# Patient Record
Sex: Female | Born: 1945
Health system: Southern US, Community
[De-identification: ages and names within clinical notes are randomized; demographics above are authoritative.]

## PROBLEM LIST (undated history)

## (undated) DIAGNOSIS — C801 Malignant (primary) neoplasm, unspecified: Secondary | ICD-10-CM

## (undated) DIAGNOSIS — I10 Essential (primary) hypertension: Secondary | ICD-10-CM

## (undated) DIAGNOSIS — K219 Gastro-esophageal reflux disease without esophagitis: Secondary | ICD-10-CM

## (undated) DIAGNOSIS — M1990 Unspecified osteoarthritis, unspecified site: Secondary | ICD-10-CM

## (undated) DIAGNOSIS — G959 Disease of spinal cord, unspecified: Secondary | ICD-10-CM

## (undated) DIAGNOSIS — Z923 Personal history of irradiation: Secondary | ICD-10-CM

## (undated) HISTORY — DX: Disease of spinal cord, unspecified: G95.9

## (undated) HISTORY — PX: VAGINAL HYSTERECTOMY: SUR661

## (undated) HISTORY — PX: OTHER SURGICAL HISTORY: SHX169

## (undated) HISTORY — PX: EYE SURGERY: SHX253

## (undated) HISTORY — PX: COLONOSCOPY: SHX5424

---

## 2001-10-15 ENCOUNTER — Emergency Department (HOSPITAL_COMMUNITY): Admission: EM | Admit: 2001-10-15 | Discharge: 2001-10-15 | Payer: Self-pay | Admitting: Emergency Medicine

## 2001-10-15 ENCOUNTER — Encounter: Payer: Self-pay | Admitting: Emergency Medicine

## 2003-10-16 ENCOUNTER — Emergency Department (HOSPITAL_COMMUNITY): Admission: EM | Admit: 2003-10-16 | Discharge: 2003-10-16 | Payer: Self-pay | Admitting: Emergency Medicine

## 2009-06-19 ENCOUNTER — Ambulatory Visit: Payer: Self-pay | Admitting: Internal Medicine

## 2009-06-19 DIAGNOSIS — I10 Essential (primary) hypertension: Secondary | ICD-10-CM | POA: Insufficient documentation

## 2009-06-19 DIAGNOSIS — K219 Gastro-esophageal reflux disease without esophagitis: Secondary | ICD-10-CM

## 2009-06-19 DIAGNOSIS — K5900 Constipation, unspecified: Secondary | ICD-10-CM | POA: Insufficient documentation

## 2009-06-25 ENCOUNTER — Encounter: Payer: Self-pay | Admitting: Internal Medicine

## 2009-06-29 ENCOUNTER — Encounter: Payer: Self-pay | Admitting: Internal Medicine

## 2009-07-03 ENCOUNTER — Ambulatory Visit: Payer: Self-pay | Admitting: Internal Medicine

## 2009-07-03 ENCOUNTER — Ambulatory Visit (HOSPITAL_COMMUNITY): Admission: RE | Admit: 2009-07-03 | Discharge: 2009-07-03 | Payer: Self-pay | Admitting: Internal Medicine

## 2010-06-22 NOTE — Assessment & Plan Note (Signed)
Summary: NPP/ABD PAIN/GU   Visit Type:  Initial Consult Referring Provider:  Sherwood Gambler Primary Care Provider:  [place]  Chief Complaint:  abd pain.  History of Present Illness:  64 year old African American lady sent over out of the courtesy of Dr. Sherwood Gambler for further evaluation of abdominal pain and consideration of screening colonoscopy.  She has some very vague abdominal pain at times which she temporarily relates to constipation; she has no more than 2 bowel movements weekly and oftentimes has to take a laxative wiith which she typiocally has a BM along with resolution of her  abdominal pain. She's not had any melena  or rectal bleeding. No weight loss. No family history of colon polyps or GI neoplasia. She has never had a colonoscopy.  She has had occasional reflux symptoms she describes as heartburn over the years and has been on various acid suppressing agent from time to time; recently on Nexium - currently not taking any acid suppressing agent and not having any reflux symptoms. In addition, she denies odynophagia, dysphagia or early satiety nausea or vomiting. She denies weight loss.     Current Medications (verified): 1)  Enalapril-Hydrochlorothiazide 10-25 Mg Tabs (Enalapril-Hydrochlorothiazide) .... Take 1 Tablet By Mouth Once A Day 2)  Meloxicam 15 Mg Tabs (Meloxicam) .... Take 1 Tablet By Mouth Two Times A Day As Needed 3)  Ranitidine Hcl 150 Mg Caps (Ranitidine Hcl) .... Take 1 Tablet By Mouth Two Times A Day As Needed 4)  Premarin 0.9 Mg Tabs (Estrogens Conjugated) .... Take 1 Tablet By Mouth Once A Day 5)  Stool Softner Otc .... Take 1 Tablet By Mouth Once A Day 6)  Xanax .... As Needed  Allergies (verified): No Known Drug Allergies  Past History:  Family History: Last updated: 06-27-2009 Father: Deceased    Mother:Living age 51    Siblings: 5  ( 4 living )  One brother killed age 21  Social History: Last updated: Jun 27, 2009 Marital Status: Separated Children:  No Occupation: Disabled  Past Medical History: Hypertension Water quality scientist  Past Surgical History: Hysterectomy    Family History: Father: Deceased    Mother:Living age 36    Siblings: 5  ( 4 living )  One brother killed age 48  Social History: Marital Status: Separated Children: No Occupation: Disabled  Vital Signs:  Patient profile:   65 year old female Height:      67 inches Weight:      205.50 pounds BMI:     32.30 Temp:     98.4 degrees F oral Pulse rate:   60 / minute BP sitting:   140 / 90  (left arm) Cuff size:   regular  Vitals Entered By: Cloria Spring LPN (Jun 27, 2009 2:12 PM)  Physical Exam  General:  somewhat disheveled 65 year old lady appeared pleasant no acute distress Eyes:  no scleral icterus. Conjunctiva are pink Lungs:  clear to auscultation Heart:  regular rate and rhythm without murmur gallop rub Abdomen:  obese positive bowel sounds soft nontender without appreciable mass or organomegaly Rectal:  deferred until time of colonoscopy  Impression & Recommendations: Impression: 65 year old lady with occasional vague abdominal pain temporally related to constipation.  Currently, not having any abdominal discomfort. This nice lady has never had a colonoscopy. She would be the average risk screening category at this time.  She has occasional gastric soft reflux disease symptoms but that is not a problem for her at this time.  Recommendations: Screening/diagnostic colonoscopy in the near future. Risks,  benefits, limitations, alternatives have been reviewed;  questions have been answered. All parties were agreeable. We'll make further recommendations as to the management of her constipation once colonoscopy has been performed.  I want thank to thank Dr. Sherwood Gambler for allowing me to see this nice lady today.  Other Orders: Consultation Level IV ([id number])

## 2010-06-22 NOTE — Letter (Signed)
Summary: [facility] ORDER  [facility] ORDER   Imported By: Ricard Dillon 06/25/2009 12:54:43  _____________________________________________________________________  External Attachment:    Type:   Image     Comment:   External Document

## 2010-06-22 NOTE — Letter (Signed)
Summary: referral-[facility]  referral-[facility]   Imported By: Ricard Dillon 06/29/2009 15:47:33  _____________________________________________________________________  External Attachment:    Type:   Image     Comment:   External Document

## 2011-05-28 ENCOUNTER — Ambulatory Visit (HOSPITAL_COMMUNITY)
Admission: AD | Admit: 2011-05-28 | Discharge: 2011-05-28 | Disposition: A | Discharge status: Home / Self Care | Payer: Medicare Other | Source: Ambulatory Visit | Attending: Family Medicine | Admitting: Family Medicine

## 2011-05-28 ENCOUNTER — Other Ambulatory Visit (HOSPITAL_COMMUNITY): Payer: Self-pay | Admitting: Family Medicine

## 2011-05-28 ENCOUNTER — Ambulatory Visit (HOSPITAL_COMMUNITY)
Admission: RE | Admit: 2011-05-28 | Discharge: 2011-05-28 | Disposition: A | Discharge status: Home / Self Care | Payer: Medicare Other | Source: Ambulatory Visit | Attending: Family Medicine | Admitting: Family Medicine

## 2011-05-28 DIAGNOSIS — R52 Pain, unspecified: Secondary | ICD-10-CM

## 2011-05-28 DIAGNOSIS — M25559 Pain in unspecified hip: Secondary | ICD-10-CM | POA: Insufficient documentation

## 2011-06-30 ENCOUNTER — Ambulatory Visit (INDEPENDENT_AMBULATORY_CARE_PROVIDER_SITE_OTHER): Payer: Medicaid Other | Admitting: Orthopedic Surgery

## 2011-06-30 ENCOUNTER — Encounter: Payer: Self-pay | Admitting: Orthopedic Surgery

## 2011-06-30 DIAGNOSIS — M5430 Sciatica, unspecified side: Secondary | ICD-10-CM

## 2011-06-30 DIAGNOSIS — M5136 Other intervertebral disc degeneration, lumbar region: Secondary | ICD-10-CM

## 2011-06-30 DIAGNOSIS — M5431 Sciatica, right side: Secondary | ICD-10-CM | POA: Insufficient documentation

## 2011-06-30 DIAGNOSIS — M5137 Other intervertebral disc degeneration, lumbosacral region: Secondary | ICD-10-CM

## 2011-06-30 MED ORDER — GABAPENTIN 100 MG PO CAPS: 100.0000 mg | ORAL_CAPSULE | Freq: Three times a day (TID) | ORAL | Status: DC

## 2011-06-30 NOTE — Patient Instructions (Signed)
pinched nerve leg from arthritis in your back  Therapy will be needed

## 2011-06-30 NOTE — Progress Notes (Signed)
  Subjective:    Melissa Rosales is a 65 y.o. female who presents with right hip pain. Onset of the symptoms was several months ago. Inciting event: none. The patient reports the hip pain is worse with weight bearing, is aggravated by walking and radiates to RIGHT lower leg lateral side. Aggravating symptoms include: any weight bearing, standing and walking. Patient has had no prior hip problems. Previous visits for this problem: none. Evaluation to date: plain films, which were abnormal  . the hip joint was normal but the lumbar spine show degenerative change.Treatment to date: none.  The following portions of the patient's history were reviewed and updated as appropriate: allergies, current medications, past family history, past medical history, past social history, past surgical history and problem list.   Review of Systems Neurological: positive for numbness and tingling nervousness Behavioral/Psych: negative, nerves   Objective:    BP 140/82  Ht 5\' 7"  (1.702 m)  Wt 202 lb (91.627 kg)  BMI 31.64 kg/m2  Physical Exam(12) GENERAL: normal development , endomorphic body habitus.  Grooming and hygiene are normal  CDV: normal temperature of the lower extremities with no excessive edema Skin: normal back and legs  Lymph: nodes were not palpable/normal  Psychiatric: awake, alert and oriented  Neuro: normal sensation  Lumbar spine nontender in the midline but pain in the RIGHT gluteal area Right hip: full painless range of motion, without tenderness  Left hip: full painless range of motion, without tenderness   Imaging: X-ray hospital hip x-ray reviewed degenerative changes shown lower lumbar spine.  Hip joint normal.  Assessment:    DJD lumbar spine, hip joint normal.   Plan:    Natural history and expected course discussed. Questions answered. PT referral.  Start Neurontin 100 mg 3 times daily

## 2011-07-05 ENCOUNTER — Encounter (HOSPITAL_COMMUNITY): Payer: Self-pay

## 2011-07-05 ENCOUNTER — Ambulatory Visit (HOSPITAL_COMMUNITY)
Admission: RE | Admit: 2011-07-05 | Discharge: 2011-07-05 | Disposition: A | Discharge status: Home / Self Care | Payer: Medicare Other | Source: Ambulatory Visit | Attending: Orthopedic Surgery | Admitting: Orthopedic Surgery

## 2011-07-05 DIAGNOSIS — M545 Low back pain, unspecified: Secondary | ICD-10-CM | POA: Insufficient documentation

## 2011-07-05 DIAGNOSIS — M79609 Pain in unspecified limb: Secondary | ICD-10-CM | POA: Insufficient documentation

## 2011-07-05 DIAGNOSIS — M25569 Pain in unspecified knee: Secondary | ICD-10-CM | POA: Insufficient documentation

## 2011-07-05 DIAGNOSIS — IMO0001 Reserved for inherently not codable concepts without codable children: Secondary | ICD-10-CM | POA: Insufficient documentation

## 2011-07-05 DIAGNOSIS — M6281 Muscle weakness (generalized): Secondary | ICD-10-CM | POA: Insufficient documentation

## 2011-07-05 DIAGNOSIS — I10 Essential (primary) hypertension: Secondary | ICD-10-CM | POA: Insufficient documentation

## 2011-07-05 HISTORY — DX: Essential (primary) hypertension: I10

## 2011-07-05 NOTE — Evaluation (Signed)
Physical Therapy Evaluation- Medicaid  Patient Details  Name: Melissa Rosales MRN: [id number] Date of Birth: 07-19-1945  Today's Date: 07/05/2011 Time: 9147-8295 Time Calculation (min): 45 min Charges: 1 EV, 10 TE Visit#: 1  of 16   Re-eval: 08/04/11 Assessment Diagnosis: low back pain with right leg radiculopathy.   Next MD Visit: none Prior Therapy: none  Past Medical History:  Past Medical History  Diagnosis Date  . Hypertension    Past Surgical History:  Past Surgical History  Procedure Date  . Vaginal hysterectomy   . Vision problems since birth    Subjective Symptoms/Limitations Symptoms: low back pain with right leg radicular symptoms into the ankle and lateral leg.   How long can you sit comfortably?: 5 mins, before it starts tingling.  (sitting feels better than standing) How long can you stand comfortably?: 5-10 How long can you walk comfortably?: immediately feels it walking, relieft comes when she sits down.   Repetition: Increases Symptoms Special Tests: doesn't wake her at night, makes her sit down more frequently than she normally does.   Pain Assessment Currently in Pain?: Yes Pain Score:   4 Pain Location: Leg Pain Orientation: Right Pain Type: Chronic pain Pain Onset: More than a month ago Pain Frequency: Intermittent Pain Relieving Factors: sitting down, pain medicine (gabipentin)  Prior Functioning  Home Living Lives With: Spouse (they both live together but they are separated) Type of Home: House Home Layout: One level Home Access: Stairs to enter Entrance Stairs-Rails: Right Entrance Stairs-Number of Steps: 3 Prior Function Level of Independence: Independent with basic ADLs;Independent with homemaking with ambulation;Independent with gait;Independent with transfers Able to Take Stairs?: Yes Driving: No 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     Assessment RLE Assessment RLE Assessment: Within Functional Limits LLE Assessment LLE Assessment: Within Functional Limits Lumbar AROM Lumbar Flexion: WFL (some pulling on her right side) Lumbar Extension: WFL (pinching on her right side.  ) Lumbar - Right Side Bend: decreased 10 % (pain on right side of low back) Lumbar - Left Side Bend: WFL (pain on right side of low back) Lumbar - Right Rotation: WFL ("a little bit" of pain on the right side of her low back) Lumbar - Left Rotation: WFL ("a little bit" of discomfort on her right side)  Lumbar flexion strength: 3/5 Lumbar extension strength: 3/5  Exercise/Treatments Stretches Active Hamstring Stretch: 2 reps;30 seconds;Limitations Active Hamstring Stretch Limitations: bil Single Knee to Chest Stretch: 2 reps;30 seconds;Limitations Single Knee to Chest Stretch Limitations: bil Lower Trunk Rotation: 5 reps;10 seconds;Limitations Lower Trunk Rotation Limitations: bil Lumbar Exercises: Stability Bridge: 10 reps Bent Knee Raise: 10 reps Ab Set: 10 reps;5 seconds  Physical Therapy Assessment and Plan Clinical Impression Statement: This 66 y.o. female comes to [facility] PT for low back pain and radicular symptoms into her right lateral leg down to her ankle.  She presents wtih increased right leg pain with extension and decreased with flexion.  She has decreased sensation in her right lateral leg with episodes of tingling.  She has normal right leg strength, but decreased trunk flexion and extension strength.  She would benefit from skilled PT to increase core strength, decreased pain at end ROM in her back  and educate her on proper lifting techniques.       Rehab Potential: Good PT Frequency: Min 2X/week PT Duration: 8 weeks PT Treatment/Interventions: Functional mobility training;Therapeutic activities;Therapeutic exercise;Balance training;Neuromuscular re-education;Patient/family education;Other (comment) (  modalities as needed for  pain management, STM/mobs) PT Plan: Continue 2 xs/wk x 8 weeks, review HEP next session ( SK to chest bil, active hamstring bil, bridges, ab set, ab set with marches), add treadmill for warm-up but stay with her I don't know that she has ever been on a treadmill before, add piriformis stretch, SLR with ab set, functional squat with ab set.      Goals Home Exercise Program Pt will Perform Home Exercise Program: Independently PT Short Term Goals Time to Complete Short Term Goals: 4 weeks PT Short Term Goal 1: The patient will report pain of less than or equal to a 2/10 to show improved pain in her right leg, increased QOL and increased tolerance of functional mobility.   PT Short Term Goal 2: The patient will report mild right sided pain with forward flexion in her low back, but no pain with any other ROM testing in low back to show improved pain with mobility and therefore decreased pain during functional acitities.   PT Short Term Goal 3: The patient will increas core strength by 1/2 muscle grade (increase to 3+/5) to show improved strength and spinal stabilization.   PT Long Term Goals Time to Complete Long Term Goals: 8 weeks PT Long Term Goal 1: The patient will report no low back or right leg pain over the past week to show improved QOL and tolerance of lifting activities when she helps her mom.   PT Long Term Goal 2: The patient will increase core muscle strength to at least 4/5 to show improved spinal stability.  Long Term Goal 3: The patient will demonstrate correct lifting technique and lifting posure while lifting a 10 # box 10 times to simulate helping her mother transfer to a WC safely.   Long Term Goal 4: The patient will report no pain at end ROM in all directions of the lumbar spine to show improved pain levels, improved QOL and tolerance of functional activities.    Problem List Patient Active Problem List  Diagnoses  . HYPERTENSION  . GERD  . CONSTIPATION  . Sciatica of right  side  . DDD (degenerative disc disease), lumbar  . Pain in joint, lower leg  . Muscle weakness (generalized)    PT - End of Session Activity Tolerance: Patient tolerated treatment well General Behavior During Session: [facility] for tasks performed Cognition: [facility] for tasks performed PT Plan of Care PT Home Exercise Plan: see scanned report.   Consulted and Agree with Plan of Care: Patient  Rollene Rotunda. Selina Tapper, PT, DPT  07/05/2011, 2:55 PM  Physician Documentation Your signature is required to indicate approval of the treatment plan as stated above.  Please sign and either send electronically or make a copy of this report for your files and return this physician signed original.   Please mark one 1.__approve of plan  2. ___approve of plan with the following conditions.   ______________________________                                                          _____________________ Physician Signature  Date  

## 2011-07-11 ENCOUNTER — Ambulatory Visit (HOSPITAL_COMMUNITY)
Admission: RE | Admit: 2011-07-11 | Discharge: 2011-07-11 | Disposition: A | Discharge status: Home / Self Care | Payer: Medicare Other | Source: Ambulatory Visit | Attending: Orthopedic Surgery | Admitting: Orthopedic Surgery

## 2011-07-11 DIAGNOSIS — M25569 Pain in unspecified knee: Secondary | ICD-10-CM

## 2011-07-11 DIAGNOSIS — M6281 Muscle weakness (generalized): Secondary | ICD-10-CM

## 2011-07-11 NOTE — Progress Notes (Signed)
Physical Therapy Treatment Patient Details  Name: TANNA LOEFFLER MRN: [id number] Date of Birth: 1946/03/11  Today's Date: 07/11/2011 Time: 1350-1430 Time Calculation (min): 40 min Charges: 78' TE Visit#: 2  of 16   Re-eval: 08/04/11    Subjective: Symptoms/Limitations Symptoms: Pt reports that she is doing better from her exercises.  Pain Assessment Currently in Pain?: Yes Pain Score:   3 Pain Location: Back  Exercise/Treatments  Stretches Active Hamstring Stretch: 3 reps;30 seconds Active Hamstring Stretch Limitations: bil Single Knee to Chest Stretch: 3 reps;30 seconds Single Knee to Chest Stretch Limitations: bil Lower Trunk Rotation: Limitations Lower Trunk Rotation Limitations: 10 reps 5 sec Stability Bridge: 10 reps Bent Knee Raise: 10 reps;Limitations Bent Knee Raise Limitations: alternating BIL w/ab set Ab Set: 5 reps;Limitations;5 seconds AB Set Limitations: w/ head lift Leg Raise: Right;Left;Single;10 reps Functional Squats: 10 reps Heel Raises: 10 reps Machine Exercises Tread Mill: 5 min; instructuions for gait and posture    Physical Therapy Assessment and Plan PT Assessment and Plan Clinical Impression Statement: Pt able to corrdinate isometric abdominal exercise with dynamic LE movement.  Notable fatique to abdominal musculature after exercies.  Pt continues to have pain and spasm especially to her R gluteal region which was relieved after moderate manual techniques.   She is able to demonstrate her HEP independently PT Plan: Continue to progress as tolderated.  Add piriformis stretch, prone press up and mid back stretch.      Goals    Problem List Patient Active Problem List  Diagnoses  . HYPERTENSION  . GERD  . CONSTIPATION  . Sciatica of right side  . DDD (degenerative disc disease), lumbar  . Pain in joint, lower leg  . Muscle weakness (generalized)       Cianna Kasparian 07/11/2011, 3:26 PM

## 2011-07-18 ENCOUNTER — Ambulatory Visit (HOSPITAL_COMMUNITY)
Admission: RE | Admit: 2011-07-18 | Discharge: 2011-07-18 | Disposition: A | Discharge status: Home / Self Care | Payer: Medicare Other | Source: Ambulatory Visit | Attending: Physical Therapy | Admitting: Physical Therapy

## 2011-07-18 DIAGNOSIS — M6281 Muscle weakness (generalized): Secondary | ICD-10-CM

## 2011-07-18 DIAGNOSIS — M25569 Pain in unspecified knee: Secondary | ICD-10-CM

## 2011-07-18 NOTE — Progress Notes (Signed)
Physical Therapy Treatment Patient Details  Name: Melissa Rosales MRN: [id number] Date of Birth: Feb 24, 1946  Today's Date: 07/18/2011 Time: 4098-1191 Time Calculation (min): 42 min Charges: 10' TE Visit#: 3  of 16   Re-eval: 08/04/10    Subjective: Symptoms/Limitations Symptoms: Pt reports she stays about 3/10 for 50% of her day.  She states that her exercises are going well at home. She has been approved for Medicare coverage.   Exercise/Treatments Stretches Prone Mid Back Stretch: 3 reps;30 seconds Piriformis Stretch: 3 reps;30 seconds Lumbar Exercises Scapular Retraction: 10 reps;Theraband Theraband Level (Scapular Retraction): Level 3 (Green) Row: 10 reps;Theraband Theraband Level (Row): Level 3 (Green) Shoulder Extension: 10 reps;Theraband Theraband Level (Shoulder Extension): Level 3 (Green) Stability Clam: Side-lying;20 reps Bridge: 10 reps;5 seconds;Limitations Bridge Limitations: w/iso hip adduction Bent Knee Raise: 20 reps Bent Knee Raise Limitations: alternating BIL w/ab set Ab Set: Supine;10 reps;5 seconds AB Set Limitations: w/ head lift Straight Leg Raise: Supine;10 reps;5 seconds;Limitations Straight Leg Raises Limitations: BLE Opposite Arm/Leg Raise: Right arm/Left leg;Left arm/Right leg;20 reps Functional Squats: 15 reps Heel Raises: 15 reps;Limitations Heel Raises Limitations: Toe Raises: 15x Machine Exercises Tread Mill: 5' 1.3 mph. Less cueing for posture and ab set     Physical Therapy Assessment and Plan PT Assessment and Plan Clinical Impression Statement: Pt demonstrates significant weakness in her multifidus and decreased strength thorughout hip extensors.  Able to demo her HEP independently.  Updated HEP today. Added new exercises from the plan as well as dynamic UE exercises with core stability PT Plan: Cont to progress core stability and decrease pain to meet functional goals. Add heel squeezes    Goals    Problem List Patient Active  Problem List  Diagnoses  . HYPERTENSION  . GERD  . CONSTIPATION  . Sciatica of right side  . DDD (degenerative disc disease), lumbar  . Pain in joint, lower leg  . Muscle weakness (generalized)    PT Plan of Care PT Home Exercise Plan: updated today.  See scanned report   Deserai Cansler 07/18/2011, 2:03 PM

## 2011-07-25 ENCOUNTER — Ambulatory Visit (HOSPITAL_COMMUNITY)
Admission: RE | Admit: 2011-07-25 | Discharge: 2011-07-25 | Disposition: A | Discharge status: Home / Self Care | Payer: Medicare Other | Source: Ambulatory Visit | Attending: Family Medicine | Admitting: Family Medicine

## 2011-07-25 DIAGNOSIS — IMO0001 Reserved for inherently not codable concepts without codable children: Secondary | ICD-10-CM | POA: Insufficient documentation

## 2011-07-25 DIAGNOSIS — M25569 Pain in unspecified knee: Secondary | ICD-10-CM

## 2011-07-25 DIAGNOSIS — I10 Essential (primary) hypertension: Secondary | ICD-10-CM | POA: Insufficient documentation

## 2011-07-25 DIAGNOSIS — M545 Low back pain, unspecified: Secondary | ICD-10-CM | POA: Insufficient documentation

## 2011-07-25 DIAGNOSIS — M6281 Muscle weakness (generalized): Secondary | ICD-10-CM

## 2011-07-25 DIAGNOSIS — M79609 Pain in unspecified limb: Secondary | ICD-10-CM | POA: Insufficient documentation

## 2011-07-25 NOTE — Progress Notes (Signed)
Physical Therapy Treatment Patient Details  Name: Melissa Rosales MRN: [id number] Date of Birth: 1945/09/13  Today's Date: 07/25/2011 Time: 4098-1191 Time Calculation (min): 46 min 46' TE Visit#: 4  of 16   Re-eval: 08/04/11    Subjective: Symptoms/Limitations Symptoms: Reports independence with HEP and her pain is less.  States she is able to stand comfortably for 10 minutes and walk comfortably for 5 minutes with an increase in pain.   Reports less pain with her daily activities. Pain Assessment Currently in Pain?: Yes Pain Score:   2  Precautions/Restrictions     Exercise/Treatments Stretches Lower Trunk Rotation: 5 reps Lower Trunk Rotation Limitations: 5 sec Press Ups: 60 seconds Prone Mid Back Stretch: 3 reps;30 seconds Piriformis Stretch: 3 reps;30 seconds;Limitations Piriformis Stretch Limitations: BLE Lumbar Exercises Scapular Retraction: 10 reps Theraband Level (Scapular Retraction): Level 3 (Green) Row: 15 reps Theraband Level (Row): Level 3 (Green) Shoulder Extension: 15 reps Theraband Level (Shoulder Extension): Level 3 (Green) Shoulder ADduction: Both;10 reps;Theraband Theraband Level (Shoulder Adduction): Level 3 (Green) Stability Bridge: 10 reps Bridge Limitations: w/knee coming forward to encourage pelvic motion (decreased motion to r pelvis) Heel Squeeze: 10 reps;5 seconds Opposite Arm/Leg Raise: Right arm/Left leg;Left arm/Right leg;10 reps Functional Squats: 20 reps Heel Raises: 20 reps Heel Raises Limitations: Toe Raises: 20x Machine Exercises Tread Mill: 8' w/instruction for posture  Stretches Piriformis Stretch: 3 reps;30 seconds;Limitations Piriformis Stretch Limitations: BLE Aerobic Tread Mill: 8' w/instruction for posture  Supine Hip Adduction Isometric: 10 reps;Limitations Hip Adduction Isometric Limitations: 10 sec hold  Physical Therapy Assessment and Plan PT Assessment and Plan Clinical Impression Statement: Pt has improved TA  and multifidus activation and appropriate hip extensor strength.  Demonstrates decreased R pelvic rotation.  Pt had radiating symptoms before TM today, performed 20 back extensions and reported moderate resolving in symptoms, and full resolved symtoms after 5 mintues on TM.  PT Plan: Cont to progress core stability and decrease pain to meet functional goals.    Problem List Patient Active Problem List  Diagnoses  . HYPERTENSION  . GERD  . CONSTIPATION  . Sciatica of right side  . DDD (degenerative disc disease), lumbar  . Pain in joint, lower leg  . Muscle weakness (generalized)   Gehrig Patras 07/25/2011, 1:51 PM

## 2011-08-01 ENCOUNTER — Ambulatory Visit (HOSPITAL_COMMUNITY)
Admission: RE | Admit: 2011-08-01 | Discharge: 2011-08-01 | Disposition: A | Discharge status: Home / Self Care | Payer: Medicare Other | Source: Ambulatory Visit | Attending: Family Medicine | Admitting: Family Medicine

## 2011-08-01 DIAGNOSIS — M25569 Pain in unspecified knee: Secondary | ICD-10-CM

## 2011-08-01 DIAGNOSIS — M6281 Muscle weakness (generalized): Secondary | ICD-10-CM

## 2011-08-01 NOTE — Progress Notes (Signed)
Physical Therapy Treatment Patient Details  Name: ADDLEY BALLINGER MRN: [id number] Date of Birth: 1945-10-10  Today's Date: 08/01/2011 Time: 4098-1191 Time Calculation (min): 48 min Charges: 35' Manual, 12' NMR Visit#: 5  of 16   Re-eval: 08/04/11    Subjective: Symptoms/Limitations Symptoms: I've been doing really well at home.  My pain overall is about a 2/10 Pain Assessment Currently in Pain?: Yes Pain Score:   2 Pain Location: Back  Precautions/Restrictions     Exercise/Treatments Mobility/Balance        Multifidus exercise: Prone with Tactile and Verbal cueing.  Pt responds best with VC's for pushing belly button into mat.  Able to maintain 7x for 10 sec once NMR was established.  Manual Therapy Manual Therapy: Joint mobilization Joint Mobilization: Grade III-IV PA joint mobs to R Transverse process, AP and PA to R proximal tibfib joint.  Other Manual Therapy: Nerve glides to improve sciatic and femoral nerve lenth to decrease buring into R LE.   Physical Therapy Assessment and Plan PT Assessment and Plan Clinical Impression Statement: Comparble sign forward flexion and R quadrant extension 2/10 to R hip.  Balance: R SLS: 3 sec; L SLS: 2 sec. decreased coordination with bilateral SL squat.  Burning pain to R calf only.  0/10 pain with forward flexion , same 2/10 pain with L quadrant extension.  Standing x3 minutes initally brought on burning symptoms and R knee flexion 117 after manual therapy knee flexion to 125, mainitained 3 minutes and leg burned.  Required multimodal cueing for approrpiate multifidus activation.    PT Plan: Re-assess quadrant pain and burning into legs.  If able, progress 10x10 sec holds for multifidus and add PFC.     Goals    Problem List Patient Active Problem List  Diagnoses  . HYPERTENSION  . GERD  . CONSTIPATION  . Sciatica of right side  . DDD (degenerative disc disease), lumbar  . Pain in joint, lower leg  . Muscle weakness  (generalized)    PT Plan of Care PT Home Exercise Plan: see scanned report.  (Multifidus 7x 10 sec holds.) Consulted and Agree with Plan of Care: Patient  GP No functional reporting required  Tessy Pawelski 08/01/2011, 4:23 PM

## 2011-08-08 ENCOUNTER — Ambulatory Visit (HOSPITAL_COMMUNITY)
Admission: RE | Admit: 2011-08-08 | Discharge: 2011-08-08 | Disposition: A | Discharge status: Home / Self Care | Payer: Medicare Other | Source: Ambulatory Visit | Attending: Family Medicine | Admitting: Family Medicine

## 2011-08-08 DIAGNOSIS — M25569 Pain in unspecified knee: Secondary | ICD-10-CM

## 2011-08-08 DIAGNOSIS — M6281 Muscle weakness (generalized): Secondary | ICD-10-CM

## 2011-08-08 NOTE — Progress Notes (Signed)
Physical Therapy Treatment Patient Details  Name: Melissa Rosales MRN: [id number] Date of Birth: [birth year]  Today's Date: 08/08/2011 Time: 4098-1191 Time Calculation (min): 42 min Visit#: 6  of 16   Re-eval: 09/05/11  Charge: therex:34 min MMT 1 unit ROM Measurement 1 unit  Subjective: Symptoms/Limitations Symptoms: Pain in R LE scale 2/10 today. Pain Assessment Currently in Pain?: Yes Pain Score:   2 Pain Location: Back Pain Orientation: Right  Objective:   Exercise/Treatments Stretches Passive Hamstring Stretch: 3 reps;30 seconds Lower Trunk Rotation: 5 reps;10 seconds Piriformis Stretch: 3 reps;30 seconds;Limitations Piriformis Stretch Limitations: BLE Stability Bridge: 10 reps Bridge Limitations: w/knee coming forward to encourage pelvic motion (decreased motion to r pelvis) Bent Knee Raise: 20 reps Bent Knee Raise Limitations: alternating BIL w/ab set  Physical Therapy Assessment and Plan PT Assessment and Plan Clinical Impression Statement: Reassessment complete, Melissa Rosales has had 6 OPPT sessions overs 4 weeks with 2/3 STG and 1/4 LTGs met.  Pt is independent with HEP.  Lumbar strength has improved to 4/5 both flexion and extension.  Pt's lumbar ROM all WFL with no c/o pain with movement.  Pt stated pain scale between 1-5/10 R LE.  Pt with max cueing required for body mechanics for lifting.   PT Plan: Recommend continuing PT 2x a week for 4 more weeks to address remaining impairements.    Goals Home Exercise Program Pt will Perform Home Exercise Program: Independently PT Goal: Perform Home Exercise Program - Progress: Met PT Short Term Goals Time to Complete Short Term Goals: 4 weeks PT Short Term Goal 1: The patient will report pain of less than or equal to a 2/10 to show improved pain in her right leg, increased QOL and increased tolerance of functional mobility.   PT Short Term Goal 1 - Progress: Not met (pain range 1-5/10) PT Short Term Goal 2: The patient  will report mild right sided pain with forward flexion in her low back, but no pain with any other ROM testing in low back to show improved pain with mobility and therefore decreased pain during functional acitities.   PT Short Term Goal 2 - Progress: Met (no pain with any motion test) PT Short Term Goal 3: The patient will increas core strength by 1/2 muscle grade (increase to 3+/5) to show improved strength and spinal stabilization.   PT Short Term Goal 3 - Progress: Met PT Long Term Goals Time to Complete Long Term Goals: 8 weeks PT Long Term Goal 1: The patient will report no low back or right leg pain over the past week to show improved QOL and tolerance of lifting activities when she helps her mom.   PT Long Term Goal 1 - Progress: Not met PT Long Term Goal 2: The patient will increase core muscle strength to at least 4/5 to show improved spinal stability.  PT Long Term Goal 2 - Progress: Met Long Term Goal 3: The patient will demonstrate correct lifting technique and lifting posure while lifting a 10 # box 10 times to simulate helping her mother transfer to a WC safely.   Long Term Goal 3 Progress: Not met Long Term Goal 4: The patient will report no pain at end ROM in all directions of the lumbar spine to show improved pain levels, improved QOL and tolerance of functional activities.   Long Term Goal 4 Progress: Not met  Problem List Patient Active Problem List  Diagnoses  . HYPERTENSION  . GERD  . CONSTIPATION  .  Sciatica of right side  . DDD (degenerative disc disease), lumbar  . Pain in joint, lower leg  . Muscle weakness (generalized)    PT - End of Session Activity Tolerance: Patient tolerated treatment well General Behavior During Session: [facility] for tasks performed Cognition: [facility] for tasks performed  GP No functional reporting required  Juel Burrow, PTA 08/08/2011, 5:32 PM

## 2011-08-12 ENCOUNTER — Ambulatory Visit (HOSPITAL_COMMUNITY)
Admission: RE | Admit: 2011-08-12 | Discharge: 2011-08-12 | Disposition: A | Discharge status: Home / Self Care | Payer: Medicare Other | Source: Ambulatory Visit

## 2011-08-12 DIAGNOSIS — M25569 Pain in unspecified knee: Secondary | ICD-10-CM

## 2011-08-12 DIAGNOSIS — M6281 Muscle weakness (generalized): Secondary | ICD-10-CM

## 2011-08-12 NOTE — Progress Notes (Signed)
Physical Therapy Treatment Patient Details  Name: Melissa Rosales MRN: [id number] Date of Birth: [birth year]  Today's Date: 08/12/2011 Time: 8469-6295 Time Calculation (min): 45 min Visit#: 7  of 16   Re-eval: 09/05/11  Charge: manual 10 min therex 23 min NMR 12 min  Subjective: Symptoms/Limitations Symptoms: Pt stated radiating R LE pain 2/10 today.   Pain Assessment Currently in Pain?: Yes Pain Score:   2 Pain Location: Leg Pain Orientation: Right  Objective:   Exercise/Treatments Stretches Piriformis Stretch: 3 reps;30 seconds;Limitations Piriformis Stretch Limitations: BLE Lumbar Exercises Scapular Retraction: 15 reps;Standing;Theraband Theraband Level (Scapular Retraction): Level 3 (Green) Row: 15 reps;Standing;Theraband Theraband Level (Row): Level 3 (Green) Shoulder Extension: 15 reps;Standing;Theraband Theraband Level (Shoulder Extension): Level 3 (Green) Stability Clam: Side-lying;5 reps;Limitations Clam Limitations: NMR for glut med with tactile cueing to deactivate quad and h/s 5x 10" holds with noticable fatigue Bridge: 10 reps Bridge Limitations: w/knee coming forward to encourage pelvic motion (decreased motion to r pelvis) Heel Squeeze: 10 reps;5 seconds Opposite Arm/Leg Raise: Right arm/Left leg;Left arm/Right leg;10 reps Functional Squats: Limitations Functional Squats Limitations: resume next session Machine Exercises Tread Mill: resume next time  Manual Therapy Manual Therapy: Other (comment) Other Manual Therapy: Nerve glides to improve sciatic and femoral nerve lenth to decrease buring into R LE.x 10 min  Physical Therapy Assessment and Plan PT Assessment and Plan Clinical Impression Statement: Nerve gliding complete with positive results, pt with increased lumbar ROM and stated decreased tingling in R foot following manual.  NMR complete for proper musculature complete for glut met and transverse transverse abdominis.  Pt required  tactile/verbal cueing to deactive quad and h/s to reduce compensation secondary to weakness.  Pt explained musculature purpose/action for stability with TrA.  Pt unable to hold TrA contraction supine position and R sidelying.  Able to hold contraction max of 5" 2 reps. PT Plan: Begin heel/toe in and out for hip mobility to improve gait mechanics and resume functional squats with appropraite body mechanics next session.      Goals    Problem List Patient Active Problem List  Diagnoses  . HYPERTENSION  . GERD  . CONSTIPATION  . Sciatica of right side  . DDD (degenerative disc disease), lumbar  . Pain in joint, lower leg  . Muscle weakness (generalized)    PT - End of Session Activity Tolerance: Patient tolerated treatment well General Behavior During Session: [facility] for tasks performed Cognition: [facility] for tasks performed  GP No functional reporting required  Juel Burrow 08/12/2011, 2:46 PM

## 2011-08-15 ENCOUNTER — Ambulatory Visit (HOSPITAL_COMMUNITY): Payer: Medicare Other | Admitting: *Deleted

## 2011-08-15 ENCOUNTER — Ambulatory Visit (HOSPITAL_COMMUNITY): Payer: Medicare Other | Admitting: Physical Therapy

## 2011-08-17 ENCOUNTER — Ambulatory Visit (HOSPITAL_COMMUNITY): Payer: Medicare Other | Admitting: Physical Therapy

## 2011-08-19 ENCOUNTER — Ambulatory Visit (HOSPITAL_COMMUNITY): Payer: Medicare Other | Admitting: Physical Therapy

## 2011-08-23 ENCOUNTER — Ambulatory Visit (HOSPITAL_COMMUNITY)
Admission: RE | Admit: 2011-08-23 | Discharge: 2011-08-23 | Disposition: A | Discharge status: Home / Self Care | Payer: Medicare Other | Source: Ambulatory Visit | Attending: Orthopedic Surgery | Admitting: Orthopedic Surgery

## 2011-08-23 DIAGNOSIS — M545 Low back pain, unspecified: Secondary | ICD-10-CM | POA: Insufficient documentation

## 2011-08-23 DIAGNOSIS — M6281 Muscle weakness (generalized): Secondary | ICD-10-CM

## 2011-08-23 DIAGNOSIS — M79609 Pain in unspecified limb: Secondary | ICD-10-CM | POA: Insufficient documentation

## 2011-08-23 DIAGNOSIS — IMO0001 Reserved for inherently not codable concepts without codable children: Secondary | ICD-10-CM | POA: Insufficient documentation

## 2011-08-23 DIAGNOSIS — M25569 Pain in unspecified knee: Secondary | ICD-10-CM

## 2011-08-23 DIAGNOSIS — I10 Essential (primary) hypertension: Secondary | ICD-10-CM | POA: Insufficient documentation

## 2011-08-23 NOTE — Progress Notes (Signed)
Physical Therapy Treatment Patient Details  Name: Melissa Rosales MRN: [id number] Date of Birth: [birth year]  Today's Date: 08/23/2011 Time: 7829-5621 Time Calculation (min): 40 min Visit#: 8  of 16   Re-eval: 09/05/11  Charge:  therex 16 min Gait 6 min NMR 10 min Manual 8 min  Subjective: Symptoms/Limitations Symptoms: Radiating R LE pain 2/10 today, no pain in back.  The nerve gliding helped a little bit last treatment.  Pain Assessment Currently in Pain?: Yes Pain Score:   2 Pain Location: Leg Pain Orientation: Right  Objective:   Exercise/Treatments Stability Clam: 10 reps;Limitations;Side-lying Clam Limitations: sitting toe/heel in/out 10" holds with diaphragmatic breathing; NMR for glut med with tactile cueing to deactive quad/h/s Bridge: 10 reps Functional Squats: Standing;20 reps;Limitations Functional Squats Limitations: with cueing for proper form Machine Exercises Tread Mill: 6' @ 1.5 w/ cueing to increase stride length and posture  Manual Therapy Manual Therapy: Other (comment) Other Manual Therapy: Nerve glides to improve sciatic and femoral nerve lenth to decrease buring into R LE  Physical Therapy Assessment and Plan PT Assessment and Plan Clinical Impression Statement: Began seated heel/toe IR/ER exercise for hip mobility with limited L hip IR noted, improved gait mechanics following new exercises.  PFC education for TrA contraction for back stability/strengthening with noted rapid superficial, unilateral (R>L) contracion requiring cueing to maintain breathing unable to hold greater than 4" max due to weakness.   PT Plan: Continue with current POC progress strength and stability.      Goals    Problem List Patient Active Problem List  Diagnoses  . HYPERTENSION  . GERD  . CONSTIPATION  . Sciatica of right side  . DDD (degenerative disc disease), lumbar  . Pain in joint, lower leg  . Muscle weakness (generalized)    PT - End of Session Activity  Tolerance: Patient tolerated treatment well General Behavior During Session: [facility] for tasks performed Cognition: [facility] for tasks performed  GP No functional reporting required  Juel Burrow, PTA 08/23/2011, 3:16 PM

## 2011-08-26 ENCOUNTER — Ambulatory Visit (HOSPITAL_COMMUNITY)
Admission: RE | Admit: 2011-08-26 | Discharge: 2011-08-26 | Disposition: A | Discharge status: Home / Self Care | Payer: Medicare Other | Source: Ambulatory Visit | Attending: Family Medicine | Admitting: Family Medicine

## 2011-08-26 DIAGNOSIS — M6281 Muscle weakness (generalized): Secondary | ICD-10-CM

## 2011-08-26 DIAGNOSIS — M25569 Pain in unspecified knee: Secondary | ICD-10-CM

## 2011-08-26 NOTE — Progress Notes (Signed)
Physical Therapy Treatment Patient Details  Name: Melissa Rosales MRN: [id number] Date of Birth: 1945-12-29  Today's Date: 08/26/2011 Time: 1478-2956 Time Calculation (min): 44 min Visit#: 9  of 16   Re-eval: 09/05/11  Charge: therex: 28 min Gait 8 min Manual 8 min  Subjective: Symptoms/Limitations Symptoms: No pain in back today, pt stated only pain R distal knee 2/10.  Pt stated the nerve gliding helped reduce the tingling down last treatment, Pain Assessment Currently in Pain?: Yes Pain Score:   2 Pain Location: Leg Pain Orientation: Right  Objective:  Exercise/Treatments Stability Clam: 10 reps;Limitations;Side-lying Clam Limitations: sitting toe/heel in/out 10" holds with diaphragmatic breathing; NMR for glut med with tactile cueing to deactive quad/h/s Bridge: 10 reps;Limitations Bridge Limitations: with adduction ball Heel Squeeze: 10 reps;5 seconds Opposite Arm/Leg Raise: Right arm/Left leg;Left arm/Right leg;10 reps Functional Squats: 20 reps;Limitations;5 seconds Functional Squats Limitations: good form noted following demonstration Machine Exercises Tread Mill: 8' @ 1.5 w/ cueing to increase stride length and posture  Manual Therapy Manual Therapy: Other (comment) Other Manual Therapy: Nerve glides to improve sciatic and femoral nerve lenth to decrease buring into R LE  Physical Therapy Assessment and Plan PT Assessment and Plan Clinical Impression Statement: Good gait mechanics noted on treadmill at beginning of session with min cueing initially for posture, to slow down and increase stride length.  Pt improving symmetry with PFC control. PT Plan: Continue progressing current POC    Goals    Problem List Patient Active Problem List  Diagnoses  . HYPERTENSION  . GERD  . CONSTIPATION  . Sciatica of right side  . DDD (degenerative disc disease), lumbar  . Pain in joint, lower leg  . Muscle weakness (generalized)    PT - End of Session Activity  Tolerance: Patient tolerated treatment well General Behavior During Session: [facility] for tasks performed Cognition: [facility] for tasks performed  GP No functional reporting required  Juel Burrow, PTA 08/26/2011, 3:51 PM

## 2011-08-29 ENCOUNTER — Ambulatory Visit (HOSPITAL_COMMUNITY)
Admission: RE | Admit: 2011-08-29 | Discharge: 2011-08-29 | Disposition: A | Discharge status: Home / Self Care | Payer: Medicare Other | Source: Ambulatory Visit | Attending: Orthopedic Surgery | Admitting: Orthopedic Surgery

## 2011-08-29 NOTE — Progress Notes (Signed)
Physical Therapy Treatment Patient Details  Name: Melissa Rosales MRN: [id number] Date of Birth: 1945/08/11  Today's Date: 08/29/2011 Time: 5621-3086 Time Calculation (min): 40 min Charges: 10' NMR, 30' TE Visit#: 10  of 16   Re-eval:      Subjective: Symptoms/Limitations Symptoms: Pt reports that her pain in her R leg happens typically at the middle of the and during the night time and sometimes it wakes her up at night (nagging feeling) Pain Assessment Currently in Pain?: Yes Pain Score:   2 Pain Location: Leg Pain Orientation: Right  Exercise/Treatments Standing Other Standing Knee Exercises: Clams, standing on RLE attempted 5 x Seated Other Seated Knee Exercises: Heel/Toe Roll in and outs 7x10 sec holds Supine Bridges: 10 reps;Limitations Bridges Limitations: w/marching 10 reps each side; w/clams grn t-band x10 Sidelying Clams: NMR to establish correct position.  AAROM to RLE 3x10 sec holds, AROM LLE 4x10 sec Prone  Other Prone Exercises: Multifidus exercises after NMR established 10x10 sec   Manual Therapy Other Manual Therapy: Prone: Nerve glides to improve sciatic and femoral nerve lenth to decrease buring into R LE (start 90 degrees knee flexion with pain, after: 120 degrees0  Physical Therapy Assessment and Plan PT Assessment and Plan Clinical Impression Statement: Reports she has improved pain to her lower leg.  She continues to have increased pain to her hip, which is likely due to her decreased gluteus medius strength.   required max NMR to activate R gluteus medius and hold for 10 sec.  PT Plan: Cont to progress    Goals    Problem List Patient Active Problem List  Diagnoses  . HYPERTENSION  . GERD  . CONSTIPATION  . Sciatica of right side  . DDD (degenerative disc disease), lumbar  . Pain in joint, lower leg  . Muscle weakness (generalized)   GP No functional reporting required  Lashaye Fisk 08/29/2011, 1:52 PM

## 2011-09-02 ENCOUNTER — Ambulatory Visit (HOSPITAL_COMMUNITY)
Admission: RE | Admit: 2011-09-02 | Discharge: 2011-09-02 | Disposition: A | Discharge status: Home / Self Care | Payer: Medicare Other | Source: Ambulatory Visit | Attending: Family Medicine | Admitting: Family Medicine

## 2011-09-02 DIAGNOSIS — M25569 Pain in unspecified knee: Secondary | ICD-10-CM

## 2011-09-02 DIAGNOSIS — M6281 Muscle weakness (generalized): Secondary | ICD-10-CM

## 2011-09-02 NOTE — Evaluation (Addendum)
Physical Therapy Discharge and Treatment  Patient Details  Name: Melissa Rosales MRN: [id number] Date of Birth: 12-22-1945  Today's Date: 09/02/2011 Time: 4098-1191 Time Calculation (min): 43 min Charges: 1 ROM, 1 MMT, 40' TE Visit#: 11  of 16   Re-eval: 09/02/11    Past Medical History:  Past Medical History  Diagnosis Date  . Hypertension    Past Surgical History:  Past Surgical History  Procedure Date  . Vaginal hysterectomy   . Vision problems since birth     Subjective Symptoms/Limitations Symptoms: I feel like I am 90% better.  My pain is just in my R hip and Lower Leg sometimes.   How long can you sit comfortably?: No difficulty w/o radicular symptoms How long can you stand comfortably?: 15 minutes How long can you walk comfortably?: 30 minutes continuously.  Pain Assessment Currently in Pain?: No/denies  Assessment RLE Strength Right Hip Flexion: 4/5; Extension: 4/5; ABduction: 4/5; ADduction: 4/5 Right Knee Flexion: 4/5; Extension: 4/5 LLE Strength Left Hip Flexion: 4/5; Extension: 4/5; ABduction: 4/5; ADduction: 4/5 Left Knee Flexion: 4/5; Extension: 4/5 Lumbar AROM Overall Lumbar AROM Comments: without complaints of pain Lumbar Flexion: WFL;  Extension: WFL; Right Side Bend: WFL;  Left Side Bend: WFL;  Right Rotation: WFL;  Left Rotation: [facility]  Lumbar Strength Lumbar Flexion: 5/5; Extension: 5/5 Palpation: mild increased pain to R hip and lower leg  Exercise/Treatments Stretches Active Hamstring Stretch: Limitations Active Hamstring Stretch Limitations: Nerve glides x15 BLE Aerobic Tread Mill: 10 min 1.5 mph Standing Heel Raises: 20 reps;Limitations Heel Raises Limitations: Toe Raises 20 reps Wall Slides: 10 reps;3 seconds Other Standing Lumbar Exercises: Floor to counter box lifts 10# x10 w/cueing for proper techniques Supine Clam: 5 reps;Other (comment) (15 sec holds) Clam Limitations: BLE Prone  Opposite Arm/Leg Raise: Right arm/Left leg;Left  arm/Right leg;10 reps Other Prone Lumbar Exercises: Multifidus 5x10 sec holds  Education on importance of HEP.  Reviewed advanced HEP  Physical Therapy Assessment and Plan PT Assessment and Plan Clinical Impression Statement: Melissa Rosales has attended 11 OP PT visits and has met all of her STG and 4/5 of her LTG.  She has dramatically improved with her overall LE strength, motor control, muscular endurance, core stability  and independence with her HEP which has decreased her overall back and LE pain. She continues to complain of R hip pain and R LE pain (1/10) occasionally.  She reports that overall her walking has become better and she no longer has to manually transfer her mother.  She will continue to benefit from a HEP to continue with maintence.  PT Plan: D/C w/HEP    Goals Home Exercise Program Pt will Perform Home Exercise Program: Independently PT Goal: Perform Home Exercise Program - Progress: Met PT Short Term Goals Time to Complete Short Term Goals: 4 weeks PT Short Term Goal 1: The patient will report pain of less than or equal to a 2/10 to show improved pain in her right leg, increased QOL and increased tolerance of functional mobility.   PT Short Term Goal 1 - Progress: Met PT Short Term Goal 2: The patient will report mild right sided pain with forward flexion in her low back, but no pain with any other ROM testing in low back to show improved pain with mobility and therefore decreased pain during functional acitities.   PT Short Term Goal 2 - Progress: Met PT Short Term Goal 3: The patient will increas core strength by 1/2 muscle grade (increase to  3+/5) to show improved strength and spinal stabilization.   PT Short Term Goal 3 - Progress: Met PT Long Term Goals Time to Complete Long Term Goals: 8 weeks PT Long Term Goal 1: The patient will report no low back or right leg pain over the past week to show improved QOL and tolerance of lifting activities when she helps her mom.     PT Long Term Goal 1 - Progress: Partly met PT Long Term Goal 2: The patient will increase core muscle strength to at least 4/5 to show improved spinal stability.  PT Long Term Goal 2 - Progress: Met Long Term Goal 3: The patient will demonstrate correct lifting technique and lifting posure while lifting a 10 # box 10 times to simulate helping her mother transfer to a WC safely.   Long Term Goal 3 Progress: Met Long Term Goal 4: The patient will report no pain at end ROM in all directions of the lumbar spine to show improved pain levels, improved QOL and tolerance of functional activities.   Long Term Goal 4 Progress: Met  Problem List Patient Active Problem List  Diagnoses  . HYPERTENSION  . GERD  . CONSTIPATION  . Sciatica of right side  . DDD (degenerative disc disease), lumbar  . Pain in joint, lower leg  . Muscle weakness (generalized)   Melissa Rosales 09/02/2011, 1:40 PM  Physician Documentation Your signature is required to indicate approval of the treatment plan as stated above.  Please sign and either send electronically or make a copy of this report for your files and return this physician signed original.   Please mark one 1.__approve of plan  2. ___approve of plan with the following conditions.   ______________________________                                                          _____________________ Physician Signature                                                                                                             Date

## 2011-09-02 NOTE — Evaluation (Signed)
Physical Therapy Evaluation  Patient Details  Name: Melissa Rosales MRN: [id number] Date of Birth: Nov 07, 1945  Today's Date: 09/02/2011 Time: 1301-     Visit#: 11  of 16   Re-eval: 09/02/11    Past Medical History:  Past Medical History  Diagnosis Date  . Hypertension    Past Surgical History:  Past Surgical History  Procedure Date  . Vaginal hysterectomy   . Vision problems since birth     Subjective    Precautions/Restrictions     Prior Functioning     Cognition/Observation    Sensation/Coordination/Flexibility/Functional Tests    Assessment RLE Strength Right Hip Flexion: 4/5 Right Hip Extension: 4/5 Right Hip ABduction: 4/5 Right Hip ADduction: 4/5 Right Knee Flexion: 4/5 Right Knee Extension: 4/5 LLE Strength Left Hip Flexion: 4/5 Left Hip Extension: 4/5 Left Hip ABduction: 4/5 Left Hip ADduction: 4/5 Left Knee Flexion: 4/5 Left Knee Extension: 4/5 Lumbar AROM Overall Lumbar AROM Comments: without complaints of pain Lumbar Flexion: WFL Lumbar Extension: WFL Lumbar - Right Side Bend: WFL Lumbar - Left Side Bend: WFL Lumbar - Right Rotation: WFL Lumbar - Left Rotation: [facility]  Lumbar Strength Lumbar Flexion: 5/5 Lumbar Extension: 5/5 Palpation Palpation: mild increased pain to R hip and lower leg  Exercise/Treatments Stretches Active Hamstring Stretch: Limitations Active Hamstring Stretch Limitations: Nerve glides x15 BLE Aerobic   Machines for Strengthening   Standing Other Standing Lumbar Exercises: Floor to counter box lifts 10# x10 w/cueing for proper techniques Seated   Supine Clam: 5 reps;Other (comment) (15 sec holds) Clam Limitations: BLE Sidelying   Prone  Opposite Arm/Leg Raise: Right arm/Left leg;Left arm/Right leg;10 reps Other Prone Lumbar Exercises: Multifidus 5x10 sec holds Quadruped       Physical Therapy Assessment and Plan PT Assessment and Plan Clinical Impression Statement: Melissa Rosales has attended 11  OP PT visits and has met all of her STG and 4/5 of her LTG.  She has dramatically improved with her overall LE strength, motor control, muscular endurance, core stability  and independence with her HEP which has decreased her overall back and LE pain. She continues to complain of R hip pain and R LE pain (1/10) occasionally.  She reports that overall her walking has become better and she no longer has to manually transfer her mother.  She will continue to benefit from a HEP to continue with maintence.  PT Plan: D/C w/HEP    Goals Home Exercise Program Pt will Perform Home Exercise Program: Independently PT Goal: Perform Home Exercise Program - Progress: Met PT Short Term Goals Time to Complete Short Term Goals: 4 weeks PT Short Term Goal 1: The patient will report pain of less than or equal to a 2/10 to show improved pain in her right leg, increased QOL and increased tolerance of functional mobility.   PT Short Term Goal 1 - Progress: Met PT Short Term Goal 2: The patient will report mild right sided pain with forward flexion in her low back, but no pain with any other ROM testing in low back to show improved pain with mobility and therefore decreased pain during functional acitities.   PT Short Term Goal 2 - Progress: Met PT Short Term Goal 3: The patient will increas core strength by 1/2 muscle grade (increase to 3+/5) to show improved strength and spinal stabilization.   PT Short Term Goal 3 - Progress: Met PT Long Term Goals Time to Complete Long Term Goals: 8 weeks PT Long Term Goal 1: The patient  will report no low back or right leg pain over the past week to show improved QOL and tolerance of lifting activities when she helps her mom.   PT Long Term Goal 1 - Progress: Partly met PT Long Term Goal 2: The patient will increase core muscle strength to at least 4/5 to show improved spinal stability.  PT Long Term Goal 2 - Progress: Met Long Term Goal 3: The patient will demonstrate correct  lifting technique and lifting posure while lifting a 10 # box 10 times to simulate helping her mother transfer to a WC safely.   Long Term Goal 3 Progress: Met Long Term Goal 4: The patient will report no pain at end ROM in all directions of the lumbar spine to show improved pain levels, improved QOL and tolerance of functional activities.   Long Term Goal 4 Progress: Met  Problem List Patient Active Problem List  Diagnoses  . HYPERTENSION  . GERD  . CONSTIPATION  . Sciatica of right side  . DDD (degenerative disc disease), lumbar  . Pain in joint, lower leg  . Muscle weakness (generalized)    Melissa Rosales 09/02/2011, 1:25 PM

## 2011-09-06 ENCOUNTER — Ambulatory Visit (HOSPITAL_COMMUNITY): Payer: Medicare Other

## 2011-09-09 ENCOUNTER — Ambulatory Visit (HOSPITAL_COMMUNITY): Payer: Medicare Other

## 2011-09-13 ENCOUNTER — Ambulatory Visit (HOSPITAL_COMMUNITY): Payer: Medicare Other | Admitting: Physical Therapy

## 2011-09-16 ENCOUNTER — Ambulatory Visit (HOSPITAL_COMMUNITY): Payer: Medicare Other

## 2011-09-20 ENCOUNTER — Ambulatory Visit (HOSPITAL_COMMUNITY): Payer: Medicare Other | Admitting: Physical Therapy

## 2011-09-23 ENCOUNTER — Ambulatory Visit (HOSPITAL_COMMUNITY): Payer: Medicare Other | Admitting: Physical Therapy

## 2011-09-26 ENCOUNTER — Telehealth: Payer: Self-pay | Admitting: Orthopedic Surgery

## 2011-09-26 NOTE — Telephone Encounter (Signed)
No other recommendations from orthopaedic standpoint  No other appts needed

## 2011-09-26 NOTE — Telephone Encounter (Signed)
Patient called to relay that her right leg and hip are not much better after having done the physical therapy.  Also mentioned that the pain medication Norco is not helping as much any longer. She asked if she should come in for another appointment, or does Dr. Romeo Apple have any other advice?  Her cell ph# is best number: [phone] [facility]).  Pleease advise.

## 2011-09-27 NOTE — Telephone Encounter (Signed)
Called patient and relayed.  Understands, and may contact primary care if does not get better.

## 2011-10-05 ENCOUNTER — Other Ambulatory Visit (HOSPITAL_COMMUNITY): Payer: Self-pay | Admitting: Family Medicine

## 2011-10-05 DIAGNOSIS — Z139 Encounter for screening, unspecified: Secondary | ICD-10-CM

## 2011-10-10 ENCOUNTER — Ambulatory Visit (HOSPITAL_COMMUNITY)
Admission: RE | Admit: 2011-10-10 | Discharge: 2011-10-10 | Disposition: A | Discharge status: Home / Self Care | Payer: Medicare Other | Source: Ambulatory Visit | Attending: Family Medicine | Admitting: Family Medicine

## 2011-10-10 DIAGNOSIS — Z1231 Encounter for screening mammogram for malignant neoplasm of breast: Secondary | ICD-10-CM | POA: Insufficient documentation

## 2011-10-10 DIAGNOSIS — Z139 Encounter for screening, unspecified: Secondary | ICD-10-CM

## 2011-10-10 DIAGNOSIS — Z13820 Encounter for screening for osteoporosis: Secondary | ICD-10-CM | POA: Insufficient documentation

## 2011-10-10 DIAGNOSIS — Z780 Asymptomatic menopausal state: Secondary | ICD-10-CM | POA: Insufficient documentation

## 2012-08-26 ENCOUNTER — Encounter (HOSPITAL_COMMUNITY): Payer: Self-pay

## 2012-08-26 ENCOUNTER — Emergency Department (HOSPITAL_COMMUNITY): Payer: PRIVATE HEALTH INSURANCE

## 2012-08-26 ENCOUNTER — Emergency Department (HOSPITAL_COMMUNITY)
Admission: EM | Admit: 2012-08-26 | Discharge: 2012-08-26 | Disposition: A | Discharge status: Home / Self Care | Payer: PRIVATE HEALTH INSURANCE | Attending: Emergency Medicine | Admitting: Emergency Medicine

## 2012-08-26 DIAGNOSIS — H9190 Unspecified hearing loss, unspecified ear: Secondary | ICD-10-CM | POA: Insufficient documentation

## 2012-08-26 DIAGNOSIS — M129 Arthropathy, unspecified: Secondary | ICD-10-CM | POA: Insufficient documentation

## 2012-08-26 DIAGNOSIS — E876 Hypokalemia: Secondary | ICD-10-CM | POA: Insufficient documentation

## 2012-08-26 DIAGNOSIS — R42 Dizziness and giddiness: Secondary | ICD-10-CM | POA: Insufficient documentation

## 2012-08-26 DIAGNOSIS — K219 Gastro-esophageal reflux disease without esophagitis: Secondary | ICD-10-CM | POA: Insufficient documentation

## 2012-08-26 DIAGNOSIS — I10 Essential (primary) hypertension: Secondary | ICD-10-CM | POA: Insufficient documentation

## 2012-08-26 DIAGNOSIS — R110 Nausea: Secondary | ICD-10-CM | POA: Insufficient documentation

## 2012-08-26 DIAGNOSIS — H8190 Unspecified disorder of vestibular function, unspecified ear: Secondary | ICD-10-CM

## 2012-08-26 DIAGNOSIS — R262 Difficulty in walking, not elsewhere classified: Secondary | ICD-10-CM | POA: Insufficient documentation

## 2012-08-26 DIAGNOSIS — Z79899 Other long term (current) drug therapy: Secondary | ICD-10-CM | POA: Insufficient documentation

## 2012-08-26 DIAGNOSIS — H9319 Tinnitus, unspecified ear: Secondary | ICD-10-CM | POA: Insufficient documentation

## 2012-08-26 DIAGNOSIS — H9192 Unspecified hearing loss, left ear: Secondary | ICD-10-CM

## 2012-08-26 HISTORY — DX: Unspecified osteoarthritis, unspecified site: M19.90

## 2012-08-26 HISTORY — DX: Gastro-esophageal reflux disease without esophagitis: K21.9

## 2012-08-26 LAB — CBC WITH DIFFERENTIAL/PLATELET
Eosinophils Relative: 0 % (ref 0–5)
HCT: 42 % (ref 36.0–46.0)
Hemoglobin: 13.8 g/dL (ref 12.0–15.0)
Lymphocytes Relative: 17 % (ref 12–46)
Lymphs Abs: 1.5 10*3/uL (ref 0.7–4.0)
MCV: 85.5 fL (ref 78.0–100.0)
Monocytes Absolute: 0.6 10*3/uL (ref 0.1–1.0)
Monocytes Relative: 7 % (ref 3–12)
RBC: 4.91 MIL/uL (ref 3.87–5.11)
WBC: 9 10*3/uL (ref 4.0–10.5)

## 2012-08-26 LAB — BASIC METABOLIC PANEL
BUN: 18 mg/dL (ref 6–23)
CO2: 33 mEq/L — ABNORMAL HIGH (ref 19–32)
Calcium: 9.9 mg/dL (ref 8.4–10.5)
Creatinine, Ser: 0.89 mg/dL (ref 0.50–1.10)
Glucose, Bld: 90 mg/dL (ref 70–99)
Sodium: 143 mEq/L (ref 135–145)

## 2012-08-26 MED ORDER — POTASSIUM CHLORIDE ER 20 MEQ PO TBCR: 20.0000 meq | EXTENDED_RELEASE_TABLET | Freq: Two times a day (BID) | ORAL | Status: DC

## 2012-08-26 MED ORDER — POTASSIUM CHLORIDE CRYS ER 20 MEQ PO TBCR
40.0000 meq | EXTENDED_RELEASE_TABLET | Freq: Once | ORAL | Status: AC
  Administered 2012-08-26: 40 meq via ORAL
  Filled 2012-08-26: qty 2

## 2012-08-26 NOTE — ED Notes (Signed)
Pt reports left earache and fullness since Thursday went to her pmd on Friday and given antibiotic, took 1 day and started vomiting so she stopped taking it.  Today she is feeling worse. Vomiting has gotten better since she stopped the med.

## 2012-08-26 NOTE — ED Provider Notes (Signed)
History    This chart was scribed for Dione Booze, MD, by Frederik Pear, ED scribe. The patient was seen in room APA18/APA18 and the patient's care was started at 0906.    CSN: [id number]  Arrival date & time 08/26/12  4098   First MD Initiated Contact with Patient 08/26/12 [phone]      Chief Complaint  Patient presents with  . Ear Fullness    (Consider location/radiation/quality/duration/timing/severity/associated sxs/prior treatment) The history is provided by the patient and medical records. No language interpreter was used.    Melissa Rosales is a 67 y.o. female with a h/o of hypertension who presents to the Emergency Department complaining of sudden onset, constant, gradually worsening left aural fullness that is neither improved or alleviated by anything with associated tinnitus, nausea, dizziness, and difficulty ambulating that has remained unchanged since onset that began in the AM 4 days ago. She denies having a headache or otalgia. She reports that she was seen by her PCP 3 days ago and was prescribed an antibiotic, which caused her to start vomiting so she discontinued use, which resolved the emesis.  PCP is [facility]  Past Medical History  Diagnosis Date  . Hypertension   . Arthritis   . Acid reflux     Past Surgical History  Procedure Laterality Date  . Vaginal hysterectomy    . Vision problems since birth      Family History  Problem Relation Age of Onset  . Arthritis      History  Substance Use Topics  . Smoking status: Never Smoker   . Smokeless tobacco: Not on file  . Alcohol Use: No    OB History   Grav Para Term Preterm Abortions TAB SAB Ect Mult Living                  Review of Systems  HENT: Positive for tinnitus. Negative for ear pain.        Aural fullness  Gastrointestinal: Positive for nausea. Negative for vomiting.  Neurological: Positive for dizziness. Negative for headaches.       Difficulty ambulating  All other  systems reviewed and are negative.    Allergies  Keflex  Home Medications   Current Outpatient Rx  Name  Route  Sig  Dispense  Refill  . amLODipine (NORVASC) 5 MG tablet   Oral   Take 5 mg by mouth daily.         . enalapril (VASOTEC) 20 MG tablet   Oral   Take 20 mg by mouth daily.         Marland Kitchen estrogens, conjugated, (PREMARIN) 0.9 MG tablet   Oral   Take 0.9 mg by mouth daily. Take daily for 21 days then do not take for 7 days.         Marland Kitchen EXPIRED: gabapentin (NEURONTIN) 100 MG capsule   Oral   Take 1 capsule (100 mg total) by mouth 3 (three) times daily.   90 capsule   2   . hydrochlorothiazide (HYDRODIURIL) 25 MG tablet   Oral   Take 25 mg by mouth daily.         . meloxicam (MOBIC) 15 MG tablet   Oral   Take 15 mg by mouth daily.         . ranitidine (ZANTAC) 150 MG tablet   Oral   Take 150 mg by mouth 2 (two) times daily.           BP  156/128  Pulse 50  Temp(Src) 98.8 F (37.1 C) (Oral)  Resp 18  Ht 5\' 4"  (1.626 m)  Wt 217 lb (98.431 kg)  BMI 37.23 kg/m2  SpO2 98%  Physical Exam  Nursing note and vitals reviewed. Constitutional: She appears well-developed and well-nourished. No distress.  Eyes:  Left eye deviates laterally.  Neck: Normal range of motion. Neck supple.  Cardiovascular: Regular rhythm.  Tachycardia present.   Pulmonary/Chest: Effort normal and breath sounds normal. No respiratory distress.  Abdominal: Soft. Bowel sounds are normal. There is no tenderness.  Neurological:  Dizziness partially reproduced by head movement. Finger to nose test is normal. Romberg's test shows general unsteadiness without clear tendency to fall in any one direction. Unable to perform tandem gait.   Skin: Skin is warm and dry. No rash noted. She is not diaphoretic.    ED Course  Procedures (including critical care time)  DIAGNOSTIC STUDIES: Oxygen Saturation is 98% on room air, normal by my interpretation.    COORDINATION OF CARE:  0918-  Discussed planned course of treatment with the patient, including a MRI and blood work, who is agreeable at this time.  Results for orders placed during the hospital encounter of 08/26/12  CBC WITH DIFFERENTIAL      Result Value Range   WBC 9.0  4.0 - 10.5 K/uL   RBC 4.91  3.87 - 5.11 MIL/uL   Hemoglobin 13.8  12.0 - 15.0 g/dL   HCT 16.1  09.6 - 04.5 %   MCV 85.5  78.0 - 100.0 fL   MCH 28.1  26.0 - 34.0 pg   MCHC 32.9  30.0 - 36.0 g/dL   RDW 40.9  81.1 - 91.4 %   Platelets 166  150 - 400 K/uL   Neutrophils Relative 76  43 - 77 %   Neutro Abs 6.9  1.7 - 7.7 K/uL   Lymphocytes Relative 17  12 - 46 %   Lymphs Abs 1.5  0.7 - 4.0 K/uL   Monocytes Relative 7  3 - 12 %   Monocytes Absolute 0.6  0.1 - 1.0 K/uL   Eosinophils Relative 0  0 - 5 %   Eosinophils Absolute 0.0  0.0 - 0.7 K/uL   Basophils Relative 0  0 - 1 %   Basophils Absolute 0.0  0.0 - 0.1 K/uL  BASIC METABOLIC PANEL      Result Value Range   Sodium 143  135 - 145 mEq/L   Potassium 3.0 (*) 3.5 - 5.1 mEq/L   Chloride 101  96 - 112 mEq/L   CO2 33 (*) 19 - 32 mEq/L   Glucose, Bld 90  70 - 99 mg/dL   BUN 18  6 - 23 mg/dL   Creatinine, Ser 7.82  0.50 - 1.10 mg/dL   Calcium 9.9  8.4 - 95.6 mg/dL   GFR calc non Af Amer 66 (*) >90 mL/min   GFR calc Af Amer 77 (*) >90 mL/min    Labs Reviewed  BASIC METABOLIC PANEL - Abnormal; Notable for the following:    Potassium 3.0 (*)    CO2 33 (*)    GFR calc non Af Amer 66 (*)    GFR calc Af Amer 77 (*)    All other components within normal limits  CBC WITH DIFFERENTIAL   Ct Head Wo Contrast  08/26/2012  *RADIOLOGY REPORT*  Clinical Data: Left ear fullness.  Altered level of consciousness. Hypertension.  CT HEAD WITHOUT CONTRAST  Technique:  Contiguous  axial images were obtained from the base of the skull through the vertex without contrast.  Comparison: None.  Findings: There may be minimal partial opacification of 1-2  left- sided mastoid air cells however, for the most part,  mastoid air cells, aerated aspect of the petrous apex and middle ear cavities are clear as are the visualized paranasal sinuses.  No intracranial hemorrhage.  Small vessel disease type changes without CT evidence of large acute infarct.  Mild global atrophy without hydrocephalus.  No intracranial mass lesion detected on this unenhanced exam.  IMPRESSION: No intracranial hemorrhage.  Small vessel disease type changes without CT evidence of large acute infarct.  Mild global atrophy without hydrocephalus.  There may be minimal partial opacification of 1-2  left-sided mastoid air cells however, for the most part, mastoid air cells, aerated aspect of the petrous apex and middle ear cavities are clear as are the visualized paranasal sinuses.   Original Report Authenticated By: Lacy Duverney, M.D.    1. Hearing loss in left ear   2. Vestibular dizziness, unspecified laterality   3. Hypokalemia     MDM  Hearing loss with normal your exam. Balance difficulty. This could represent a posterior circulation stroke. MRI would be the preferable test but is not available today. She'll be sent for CT scan.  CT is unremarkable. MRI will be obtained as an outpatient. Potassium is come back low at 3.0 and she's given a dose of potassium in the ED and a prescription for K.-Dur.  I personally performed the services described in this documentation, which was scribed in my presence. The recorded information has been reviewed and is accurate.       Dione Booze, MD 08/26/12 1126

## 2012-08-27 ENCOUNTER — Ambulatory Visit (HOSPITAL_COMMUNITY): Payer: Medicare Other | Attending: Emergency Medicine

## 2012-08-28 ENCOUNTER — Encounter (HOSPITAL_COMMUNITY): Payer: Self-pay

## 2012-08-28 ENCOUNTER — Ambulatory Visit (HOSPITAL_COMMUNITY): Admission: RE | Admit: 2012-08-28 | Payer: PRIVATE HEALTH INSURANCE | Source: Ambulatory Visit

## 2012-08-28 ENCOUNTER — Ambulatory Visit (HOSPITAL_COMMUNITY)
Admit: 2012-08-28 | Discharge: 2012-08-28 | Disposition: A | Discharge status: Home / Self Care | Payer: PRIVATE HEALTH INSURANCE | Source: Ambulatory Visit | Attending: Emergency Medicine | Admitting: Emergency Medicine

## 2012-08-28 DIAGNOSIS — R110 Nausea: Secondary | ICD-10-CM | POA: Insufficient documentation

## 2012-08-28 DIAGNOSIS — R279 Unspecified lack of coordination: Secondary | ICD-10-CM | POA: Insufficient documentation

## 2012-08-28 DIAGNOSIS — H918X9 Other specified hearing loss, unspecified ear: Secondary | ICD-10-CM | POA: Insufficient documentation

## 2012-10-18 ENCOUNTER — Ambulatory Visit (INDEPENDENT_AMBULATORY_CARE_PROVIDER_SITE_OTHER): Payer: Medicare Other | Admitting: Otolaryngology

## 2012-10-18 DIAGNOSIS — H905 Unspecified sensorineural hearing loss: Secondary | ICD-10-CM

## 2012-10-18 DIAGNOSIS — H9120 Sudden idiopathic hearing loss, unspecified ear: Secondary | ICD-10-CM

## 2013-02-08 ENCOUNTER — Other Ambulatory Visit (HOSPITAL_COMMUNITY): Payer: Self-pay | Admitting: Internal Medicine

## 2013-02-08 DIAGNOSIS — Z139 Encounter for screening, unspecified: Secondary | ICD-10-CM

## 2013-02-14 ENCOUNTER — Ambulatory Visit (HOSPITAL_COMMUNITY)
Admission: RE | Admit: 2013-02-14 | Discharge: 2013-02-14 | Disposition: A | Discharge status: Home / Self Care | Payer: PRIVATE HEALTH INSURANCE | Source: Ambulatory Visit | Attending: Internal Medicine | Admitting: Internal Medicine

## 2013-02-14 DIAGNOSIS — Z139 Encounter for screening, unspecified: Secondary | ICD-10-CM

## 2013-02-14 DIAGNOSIS — Z1231 Encounter for screening mammogram for malignant neoplasm of breast: Secondary | ICD-10-CM | POA: Insufficient documentation

## 2013-02-22 ENCOUNTER — Telehealth: Payer: Self-pay

## 2013-02-22 NOTE — Telephone Encounter (Signed)
Pt was referred by Dr. Sherwood Gambler for screening colonoscopy. He last one was 07/03/2009 and she is on recall for her next one for 06/2019. LMOM for a return call to see if she is having any problems or any new family history of colon cancer.

## 2013-03-12 NOTE — Telephone Encounter (Signed)
Called pt. She is not having any problems and no new family history of colon cancer. She is on recall for 06/2019 and will let us know if she has problems before then. I'm sending Dr. Sherwood Gambler the information.

## 2013-04-04 ENCOUNTER — Ambulatory Visit (INDEPENDENT_AMBULATORY_CARE_PROVIDER_SITE_OTHER): Payer: PRIVATE HEALTH INSURANCE | Admitting: Otolaryngology

## 2013-06-27 ENCOUNTER — Ambulatory Visit (INDEPENDENT_AMBULATORY_CARE_PROVIDER_SITE_OTHER): Payer: Medicaid Other | Admitting: Otolaryngology

## 2013-06-27 DIAGNOSIS — H9120 Sudden idiopathic hearing loss, unspecified ear: Secondary | ICD-10-CM

## 2013-06-27 DIAGNOSIS — H905 Unspecified sensorineural hearing loss: Secondary | ICD-10-CM

## 2014-01-23 ENCOUNTER — Other Ambulatory Visit (HOSPITAL_COMMUNITY): Payer: Self-pay | Admitting: Internal Medicine

## 2014-01-23 DIAGNOSIS — Z139 Encounter for screening, unspecified: Secondary | ICD-10-CM

## 2014-02-17 ENCOUNTER — Ambulatory Visit (HOSPITAL_COMMUNITY)
Admission: RE | Admit: 2014-02-17 | Discharge: 2014-02-17 | Disposition: A | Discharge status: Home / Self Care | Payer: PRIVATE HEALTH INSURANCE | Source: Ambulatory Visit | Attending: Internal Medicine | Admitting: Internal Medicine

## 2014-02-17 DIAGNOSIS — Z139 Encounter for screening, unspecified: Secondary | ICD-10-CM

## 2014-02-17 DIAGNOSIS — R928 Other abnormal and inconclusive findings on diagnostic imaging of breast: Secondary | ICD-10-CM | POA: Diagnosis not present

## 2014-02-17 DIAGNOSIS — Z1231 Encounter for screening mammogram for malignant neoplasm of breast: Secondary | ICD-10-CM | POA: Diagnosis present

## 2014-02-19 ENCOUNTER — Other Ambulatory Visit: Payer: Self-pay | Admitting: Internal Medicine

## 2014-02-19 DIAGNOSIS — R928 Other abnormal and inconclusive findings on diagnostic imaging of breast: Secondary | ICD-10-CM

## 2014-03-11 ENCOUNTER — Encounter (HOSPITAL_COMMUNITY): Payer: PRIVATE HEALTH INSURANCE

## 2014-03-18 ENCOUNTER — Encounter (HOSPITAL_COMMUNITY): Payer: PRIVATE HEALTH INSURANCE

## 2014-04-01 ENCOUNTER — Ambulatory Visit (HOSPITAL_COMMUNITY)
Admission: RE | Admit: 2014-04-01 | Discharge: 2014-04-01 | Disposition: A | Discharge status: Home / Self Care | Payer: PRIVATE HEALTH INSURANCE | Source: Ambulatory Visit | Attending: Internal Medicine | Admitting: Internal Medicine

## 2014-04-01 DIAGNOSIS — R928 Other abnormal and inconclusive findings on diagnostic imaging of breast: Secondary | ICD-10-CM | POA: Insufficient documentation

## 2014-04-03 ENCOUNTER — Ambulatory Visit (HOSPITAL_COMMUNITY)
Admission: RE | Admit: 2014-04-03 | Discharge: 2014-04-03 | Disposition: A | Discharge status: Home / Self Care | Payer: PRIVATE HEALTH INSURANCE | Source: Ambulatory Visit | Attending: Internal Medicine | Admitting: Internal Medicine

## 2014-04-03 DIAGNOSIS — IMO0001 Reserved for inherently not codable concepts without codable children: Secondary | ICD-10-CM

## 2014-04-03 DIAGNOSIS — R29898 Other symptoms and signs involving the musculoskeletal system: Secondary | ICD-10-CM

## 2014-04-03 DIAGNOSIS — M545 Low back pain: Secondary | ICD-10-CM | POA: Diagnosis not present

## 2014-04-03 DIAGNOSIS — M25661 Stiffness of right knee, not elsewhere classified: Secondary | ICD-10-CM | POA: Insufficient documentation

## 2014-04-03 DIAGNOSIS — M79661 Pain in right lower leg: Secondary | ICD-10-CM | POA: Insufficient documentation

## 2014-04-03 DIAGNOSIS — M546 Pain in thoracic spine: Secondary | ICD-10-CM | POA: Insufficient documentation

## 2014-04-03 DIAGNOSIS — Z5189 Encounter for other specified aftercare: Secondary | ICD-10-CM | POA: Diagnosis present

## 2014-04-03 NOTE — Patient Instructions (Signed)
Backward Bend (Standing)   Arch backward to make hollow of back deeper. Hold ____ seconds. Repeat ____ times per set. Do ____ sets per session. Do ____ sessions per day.  http://orth.exer.us/178   Copyright  VHI. All rights reserved.  Knee-to-Chest Stretch: Unilateral   With hand behind right knee, pull knee in to chest until a comfortable stretch is felt in lower back and buttocks. Keep back relaxed. Hold ____ seconds. Repeat ____ times per set. Do ____ sets per session. Do ____ sessions per day.  http://orth.exer.us/126   Copyright  VHI. All rights reserved.  Lower Trunk Rotation Stretch   Keeping back flat and feet together, rotate knees to left side. Hold ____ seconds. Repeat ____ times per set. Do ____ sets per session. Do ____ sessions per day.  http://orth.exer.us/122   Copyright  VHI. All rights reserved.  Hamstring Stretch: Active   Support behind right knee. Starting with knee bent, attempt to straighten knee until a comfortable stretch is felt in back of thigh. Hold ____ seconds. Repeat ____ times per set. Do ____ sets per session. Do ____ sessions per day.  http://orth.exer.us/158   Copyright  VHI. All rights reserved.  Isometric Abdominal   Lying on back with knees bent, tighten stomach by pressing elbows down. Hold ____ seconds. Repeat ____ times per set. Do ____ sets per session. Do ____ sessions per day.  http://orth.exer.us/1086   Copyright  VHI. All rights reserved.  Bent Leg Lift (Hook-Lying)   Tighten stomach and slowly raise right leg ____ inches from floor. Keep trunk rigid. Hold ____ seconds. Repeat ____ times per set. Do ____ sets per session. Do ____ sessions per day.  http://orth.exer.us/1090   Copyright  VHI. All rights reserved.  Bridging   Slowly raise buttocks from floor, keeping stomach tight. Repeat ____ times per set. Do ____ sets per session. Do ____ sessions per day.  http://orth.exer.us/1096   Copyright  VHI. All  rights reserved.  Heel Squeeze (Prone)   Abdomen supported, bend knees and gently squeeze heels together. Hold ____ seconds. Repeat ____ times per set. Do ____ sets per session. Do ____ sessions per day.  http://orth.exer.us/1080   Copyright  VHI. All rights reserved.  Straight Leg Raise (Prone)   Abdomen and head supported, keep left knee locked and raise leg at hip. Avoid arching low back. Repeat ____ times per set. Do ____ sets per session. Do ____ sessions per day.  http://orth.exer.us/1112   Copyright  VHI. All rights reserved.  On Elbows (Prone)   Rise up on elbows as high as possible, keeping hips on floor. Hold ____ seconds. Repeat ____ times per set. Do ____ sets per session. Do ____ sessions per day.   http://orth.exer.us/92   Copyright  VHI. All rights reserved.  

## 2014-04-03 NOTE — Therapy (Signed)
Physical Therapy Evaluation  Patient Details  Name: Melissa Rosales MRN: [id number] Date of Birth: Apr 28, 1946  Encounter Date: 04/03/2014      PT End of Session - 04/03/14 1557    Visit Number 1   Number of Visits 8   Date for PT Re-Evaluation 05/03/14   Authorization Type [employer] medicare   Authorization - Visit Number 1   Authorization - Number of Visits 8   PT Start Time 2841   PT Stop Time 1600   PT Time Calculation (min) 30 min      Past Medical History  Diagnosis Date  . Hypertension   . Arthritis   . Acid reflux     Past Surgical History  Procedure Laterality Date  . Vaginal hysterectomy    . Vision problems since birth      There were no vitals taken for this visit.  Visit Diagnosis:  No diagnosis found.      Subjective Assessment - 04/03/14 1621    Symptoms Pt states that if she is standing more than 15 minutes she has pain that goes down into her calf.   Ms. Herskowitz states she has difficulty going up and down steps and has noticed that her balance is worsening.           [facility] PT Assessment - 04/03/14 1539    Assessment   Medical Diagnosis radicular low back pain   Balance Screen   Has the patient fallen in the past 6 months No   Observation/Other Assessments   Focus on Therapeutic Outcomes (FOTO)  60    Posture/Postural Control   Posture/Postural Control Postural limitations   Postural Limitations Rounded Shoulders;Forward head;Increased thoracic kyphosis;Decreased lumbar lordosis   AROM   Lumbar Flexion decreased 30%   Lumbar Extension decreased 15%   Lumbar - Right Side Bend wfl   Lumbar - Left Side Bend decreased 20%   Strength   Overall Strength Comments --   Right Hip Flexion 2+/5   Right Hip Extension 3/5   Right Hip ABduction 3+/5   Left Hip Flexion 3+/5   Left Hip Extension 3+/5   Left Hip ABduction 3/5   Right Knee Flexion 3+/5   Right Knee Extension 3+/5   Left Knee Flexion 3+/5   Left Knee Extension 5/5   Right Ankle  Dorsiflexion 3+/5   Left Ankle Dorsiflexion 5/5   Balance   Balance Assessed --  unable to stand for one second on either foot.          [facility] Adult PT Treatment/Exercise - 04/03/14 1539    Exercises   Exercises Lumbar   Lumbar Exercises: Stretches   Active Hamstring Stretch 3 reps;30 seconds   Single Knee to Chest Stretch 3 reps;30 seconds   Standing Extension 5 reps   Lumbar Exercises: Supine   Bent Knee Raise 5 reps   Bridge 10 reps   Lumbar Exercises: Sidelying   Hip Abduction 5 reps   Lumbar Exercises: Prone   Straight Leg Raise 10 reps            PT Short Term Goals - 04/03/14 1619    PT SHORT TERM GOAL #1   Title I in HEP   Time 1   Period Weeks   PT SHORT TERM GOAL #2   Title Pt radicular sx no further down her leg than knee level   Time 2   Period Weeks   PT SHORT TERM GOAL #3   Title Pt to be  able to be up for 30 minutes before radicular sx occur.    Time 2   Period Weeks          PT Long Term Goals - 04-09-14 1622    PT LONG TERM GOAL #1   Title I in advance HEP   Time 4   Period Weeks   PT LONG TERM GOAL #2   Title Pt to state she is able to be up for 30 minutes without radicular sx    Time 4   Period Weeks   PT LONG TERM GOAL #3   Title Back pain no greater than a 4/10 80 % of the time   Time 4   Period Weeks   PT LONG TERM GOAL #4   Title Pt to be able to go up and down steps in reciprocal manner    Time 4   Period Weeks   PT LONG TERM GOAL #5   Title LE strength improved one grade to allow the above to occur   Time 4   Period Weeks   Additional Long Term Goals   Additional Long Term Goals Yes   PT LONG TERM GOAL #6   Title Pt SLS to be 5-10 seconds B to decrease risk of falling   Time 4          Plan - 04-09-2014 1611    Clinical Impression Statement Pt is a 68 yo female who complains of radicular pain down her Rt leg after weight bearing for greater than 10 minutes she has been referred to theapy to improve her functioal  activity.  Examination demonstrates postrual limitations, decreased core and LE strength, decreased balance and decrased ROM.  Ms. Witter will bnefit from skilled theapy to address these isssues and maximize her functional abiltiy.     Pt will benefit from skilled therapeutic intervention in order to improve on the following deficits Decreased balance;Decreased activity tolerance;Pain;Decreased range of motion;Decreased strength;Difficulty walking   Rehab Potential Good   PT Frequency 2x / week   PT Duration 4 weeks   PT Treatment/Interventions Therapeutic activities;Therapeutic exercise;Balance training;Patient/family education   PT Next Visit Plan begin education in sitting posture, w-back, decompression exercises 1-5; heel raises and functional squats    PT Home Exercise Plan given          G-Codes - April 09, 2014 1615    Functional Assessment Tool Used foto   Functional Limitation Mobility: Walking and moving around   Mobility: Walking and Moving Around Current Status [phone]) At least 40 percent but less than 60 percent impaired, limited or restricted   Mobility: Walking and Moving Around Goal Status [phone]) At least 20 percent but less than 40 percent impaired, limited or restricted      Problem List Patient Active Problem List   Diagnosis Date Noted  . Pain in joint, lower leg 07/05/2011  . Muscle weakness (generalized) 07/05/2011  . Sciatica of right side 06/30/2011  . DDD (degenerative disc disease), lumbar 06/30/2011  . HYPERTENSION 06/19/2009  . GERD 06/19/2009  . CONSTIPATION 06/19/2009            Claude Swendsen,CINDY PT April 09, 2014, 4:25 PM

## 2014-04-08 ENCOUNTER — Ambulatory Visit (HOSPITAL_COMMUNITY): Payer: PRIVATE HEALTH INSURANCE | Admitting: Physical Therapy

## 2014-04-10 ENCOUNTER — Ambulatory Visit (HOSPITAL_COMMUNITY)
Admission: RE | Admit: 2014-04-10 | Discharge: 2014-04-10 | Disposition: A | Discharge status: Home / Self Care | Payer: PRIVATE HEALTH INSURANCE | Source: Ambulatory Visit | Attending: Internal Medicine | Admitting: Internal Medicine

## 2014-04-10 DIAGNOSIS — R29898 Other symptoms and signs involving the musculoskeletal system: Secondary | ICD-10-CM

## 2014-04-10 DIAGNOSIS — IMO0001 Reserved for inherently not codable concepts without codable children: Secondary | ICD-10-CM

## 2014-04-10 DIAGNOSIS — Z5189 Encounter for other specified aftercare: Secondary | ICD-10-CM | POA: Diagnosis not present

## 2014-04-10 NOTE — Therapy (Signed)
Physical Therapy Treatment  Patient Details  Name: Melissa Rosales MRN: [id number] Date of Birth: 07/09/1945  Encounter Date: 04/10/2014      PT End of Session - 04/10/14 1624    Visit Number 2   Number of Visits 8   Date for PT Re-Evaluation 05/03/14   Authorization Type [employer] medicare   Authorization - Visit Number 2   Authorization - Number of Visits 8   PT Start Time 0814   PT Stop Time 1430   PT Time Calculation (min) 45 min      Past Medical History  Diagnosis Date  . Hypertension   . Arthritis   . Acid reflux     Past Surgical History  Procedure Laterality Date  . Vaginal hysterectomy    . Vision problems since birth      There were no vitals taken for this visit.  Visit Diagnosis:  No diagnosis found.      Subjective Assessment - 04/10/14 1424    Symptoms Pt states her back starts hurting worse when she's up moving around alot.     Currently in Pain? Yes   Pain Score 4    Pain Location Leg   Pain Orientation Right   Pain Descriptors / Indicators Tingling   Pain Radiating Towards lower lateral Rt LE   Pain Onset Other (comment)   Pain Frequency Intermittent            [facility] Adult PT Treatment/Exercise - 04/10/14 1359    Exercises   Exercises Lumbar   Lumbar Exercises: Stretches   Active Hamstring Stretch 3 reps;30 seconds   Single Knee to Chest Stretch 3 reps;30 seconds   Lumbar Exercises: Standing   Heel Raises 10 reps;Limitations   Heel Raises Limitations toeraises   Functional Squats 10 reps   Lumbar Exercises: Seated   Other Seated Lumbar Exercises w-back 10 reps   Other Seated Lumbar Exercises sitting posture education   Lumbar Exercises: Supine   Bent Knee Raise 10 reps   Bridge 10 reps   Other Supine Lumbar Exercises decompression exercises 1-5   Lumbar Exercises: Sidelying   Hip Abduction 10 reps   Lumbar Exercises: Prone   Straight Leg Raise 10 reps   Other Prone Lumbar Exercises hamstring curls 10 reps   Other Prone Lumbar  Exercises heelsqueezes 10 reps                Plan - 04/10/14 1621    Clinical Impression Statement Progressed exercises per PT POC adding seated w-backs, standing exercises and decompression exercises.  Pt given written instructions, however pt with poor eyesight and unable to view easily.  Therapist to work on increasing font/print size for written instructions next visit.  Pt educated with correct sitiing posture and explained importance of keeping correct posture.     PT Next Visit Plan Continue to increase independence and improve form with therex.  Give patient larger print HEP for decompression exercises next visit.   PT Home Exercise Plan given        Problem List Patient Active Problem List   Diagnosis Date Noted  . Pain in joint, lower leg 07/05/2011  . Muscle weakness (generalized) 07/05/2011  . Sciatica of right side 06/30/2011  . DDD (degenerative disc disease), lumbar 06/30/2011  . HYPERTENSION 06/19/2009  . GERD 06/19/2009  . CONSTIPATION 06/19/2009         Teena Irani, PTA/CLT [phone] 04/10/2014, 4:26 PM

## 2014-04-15 ENCOUNTER — Ambulatory Visit (HOSPITAL_COMMUNITY)
Admission: RE | Admit: 2014-04-15 | Discharge: 2014-04-15 | Disposition: A | Discharge status: Home / Self Care | Payer: PRIVATE HEALTH INSURANCE | Source: Ambulatory Visit | Attending: Internal Medicine | Admitting: Internal Medicine

## 2014-04-15 DIAGNOSIS — Z5189 Encounter for other specified aftercare: Secondary | ICD-10-CM | POA: Diagnosis not present

## 2014-04-15 DIAGNOSIS — IMO0001 Reserved for inherently not codable concepts without codable children: Secondary | ICD-10-CM

## 2014-04-15 DIAGNOSIS — R29898 Other symptoms and signs involving the musculoskeletal system: Secondary | ICD-10-CM

## 2014-04-15 NOTE — Therapy (Signed)
Physical Therapy Treatment  Patient Details  Name: Melissa Rosales MRN: [id number] Date of Birth: 19-Jun-1945  Encounter Date: 04/15/2014      PT End of Session - 04/15/14 1518    Visit Number 3   Number of Visits [place] - Visit Number 3   Authorization - Number of Visits 8   PT Start Time 8416   PT Stop Time 6063   PT Time Calculation (min) 39 min      Past Medical History  Diagnosis Date  . Hypertension   . Arthritis   . Acid reflux     Past Surgical History  Procedure Laterality Date  . Vaginal hysterectomy    . Vision problems since birth      There were no vitals taken for this visit.  Visit Diagnosis:  Radicular pain of right lower back  Leg weakness, bilateral      Subjective Assessment - 04/15/14 1437    Symptoms Pt states that she is doing some of her exercises at home.  Her legs feel week.    Currently in Pain? Yes   Pain Score 5    Pain Location Back   Pain Orientation Right            [facility] Adult PT Treatment/Exercise - 04/15/14 1450    Lumbar Exercises: Stretches   Active Hamstring Stretch --   Single Knee to Chest Stretch 3 reps;30 seconds   Prone on Elbows Stretch 60 seconds   Lumbar Exercises: Standing   Heel Raises 10 reps   Functional Squats 10 reps   Lumbar Exercises: Seated   Sit to Stand 10 reps   Other Seated Lumbar Exercises w-back 10 reps   Lumbar Exercises: Supine   Bent Knee Raise 15 reps   Bridge 15 reps   Straight Leg Raise 5 reps   Other Supine Lumbar Exercises decompression exercises 1-5   Lumbar Exercises: Prone   Straight Leg Raise 15 reps   Opposite Arm/Leg Raise 10 reps   Other Prone Lumbar Exercises hamstring curls 10 reps   Other Prone Lumbar Exercises heelsqueezes 10 reps          PT Education - 04/15/14 1521    Education provided Yes   Education Details proper Arts development officer for transitonal motion   Person(s) Educated Patient   Methods  Explanation;Demonstration   Comprehension Returned demonstration          PT Short Term Goals - 04/15/14 1523    PT SHORT TERM GOAL #1   Title I in HEP   Time 1   Period Weeks   Status On-going   PT SHORT TERM GOAL #2   Title Pt radicular sx no further down her leg than knee level   Time 2   Period Weeks   Status On-going   PT SHORT TERM GOAL #3   Title Pt to be able to be up for 30 minutes before radicular sx occur.    Time 2   Period Weeks   Status On-going          PT Long Term Goals - 04/15/14 1523    PT LONG TERM GOAL #1   Title I in advance HEP   Time 4   Period Weeks   Status On-going   PT LONG TERM GOAL #2   Time 4   Period Weeks   Status On-going   PT LONG TERM GOAL #3   Title Back  pain no greater than a 4/10 80 % of the time   Time 4   Period Weeks   Status On-going   PT LONG TERM GOAL #4   Title Pt to be able to go up and down steps in reciprocal manner    Time 4   Period Weeks   Status On-going   PT LONG TERM GOAL #5   Title LE strength improved one grade to allow the above to occur   Time 4   Period Weeks   Status On-going   PT LONG TERM GOAL #6   Title Pt SLS to be 5-10 seconds B to decrease risk of falling   Time 4          Plan - 04/15/14 1522    Pt will benefit from skilled therapeutic intervention in order to improve on the following deficits Decreased balance;Decreased activity tolerance;Pain;Decreased range of motion;Decreased strength;Difficulty walking   Rehab Potential Good   PT Next Visit Plan add T-band postural ex as well as SLS         Problem List Patient Active Problem List   Diagnosis Date Noted  . Pain in joint, lower leg 07/05/2011  . Muscle weakness (generalized) 07/05/2011  . Sciatica of right side 06/30/2011  . DDD (degenerative disc disease), lumbar 06/30/2011  . HYPERTENSION 06/19/2009  . GERD 06/19/2009  . CONSTIPATION 06/19/2009       Azucena Freed PT/CLT [phone]    04/15/2014, 3:24  PM

## 2014-04-22 ENCOUNTER — Ambulatory Visit (HOSPITAL_COMMUNITY)
Admission: RE | Admit: 2014-04-22 | Discharge: 2014-04-22 | Disposition: A | Discharge status: Home / Self Care | Payer: PRIVATE HEALTH INSURANCE | Source: Ambulatory Visit | Attending: Internal Medicine | Admitting: Internal Medicine

## 2014-04-22 DIAGNOSIS — Z5189 Encounter for other specified aftercare: Secondary | ICD-10-CM | POA: Insufficient documentation

## 2014-04-22 DIAGNOSIS — M545 Low back pain: Secondary | ICD-10-CM | POA: Insufficient documentation

## 2014-04-22 DIAGNOSIS — M79661 Pain in right lower leg: Secondary | ICD-10-CM | POA: Insufficient documentation

## 2014-04-22 DIAGNOSIS — R29898 Other symptoms and signs involving the musculoskeletal system: Secondary | ICD-10-CM

## 2014-04-22 DIAGNOSIS — IMO0001 Reserved for inherently not codable concepts without codable children: Secondary | ICD-10-CM

## 2014-04-22 DIAGNOSIS — M546 Pain in thoracic spine: Secondary | ICD-10-CM | POA: Insufficient documentation

## 2014-04-22 DIAGNOSIS — M25661 Stiffness of right knee, not elsewhere classified: Secondary | ICD-10-CM | POA: Insufficient documentation

## 2014-04-22 NOTE — Therapy (Signed)
Physical Therapy Treatment  Patient Details  Name: Melissa Rosales MRN: [id number] Date of Birth: February 14, 1946  Encounter Date: 04/22/2014      PT End of Session - 04/22/14 1512    Visit Number 4   Number of Visits [place] - Visit Number 4   Authorization - Number of Visits 8   PT Start Time 1430   PT Stop Time 1515   PT Time Calculation (min) 45 min   Activity Tolerance Patient tolerated treatment well      Past Medical History  Diagnosis Date  . Hypertension   . Arthritis   . Acid reflux     Past Surgical History  Procedure Laterality Date  . Vaginal hysterectomy    . Vision problems since birth      There were no vitals taken for this visit.  Visit Diagnosis:  Radicular pain of right lower back  Leg weakness, bilateral      Subjective Assessment - 04/22/14 1518    Symptoms Pt states she is still having the same amount of pain, 5/10.  Reports she is doing her HEP wtihout difficulty.            [facility] Adult PT Treatment/Exercise - 04/22/14 1440    Lumbar Exercises: Stretches   Single Knee to Chest Stretch 3 reps;30 seconds   Prone on Elbows Stretch 60 seconds   Lumbar Exercises: Standing   Heel Raises 15 reps   Heel Raises Limitations toeraises 15 reps   Functional Squats 10 reps   Scapular Retraction 10 reps;Theraband   Theraband Level (Scapular Retraction) Level 2 (Red)   Row 10 reps;Theraband   Theraband Level (Row) Level 2 (Red)   Shoulder Extension 10 reps;Theraband   Theraband Level (Shoulder Extension) Level 2 (Red)   Other Standing Lumbar Exercises SLS max of 5 R:2", Lt: 4"   Lumbar Exercises: Seated   Sit to Stand 10 reps   Other Seated Lumbar Exercises w-back 10 reps   Lumbar Exercises: Supine   Bent Knee Raise 15 reps   Bridge 15 reps   Straight Leg Raise 10 reps   Lumbar Exercises: Sidelying   Hip Abduction 15 reps   Lumbar Exercises: Prone   Straight Leg Raise 15 reps   Opposite Arm/Leg  Raise 15 reps   Other Prone Lumbar Exercises hamstring curls 15 reps   Other Prone Lumbar Exercises heelsqueezes 15 reps, 5" holds            PT Short Term Goals - 04/22/14 1508    PT SHORT TERM GOAL #1   Title I in HEP   Time 1   Period Weeks   Status On-going   PT SHORT TERM GOAL #2   Title Pt radicular sx no further down her leg than knee level   Time 2   Period Weeks   Status On-going   PT SHORT TERM GOAL #3   Title Pt to be able to be up for 30 minutes before radicular sx occur.    Time 2   Period Weeks   Status On-going          PT Long Term Goals - 04/22/14 1509    PT LONG TERM GOAL #1   Title I in advance HEP   Time 4   Period Weeks   Status On-going   PT LONG TERM GOAL #2   Time 4   Period Weeks   Status On-going  PT LONG TERM GOAL #3   Title Back pain no greater than a 4/10 80 % of the time   Time 4   Period Weeks   Status On-going   PT LONG TERM GOAL #4   Title Pt to be able to go up and down steps in reciprocal manner    Time 4   Period Weeks   Status On-going   PT LONG TERM GOAL #5   Title LE strength improved one grade to allow the above to occur   Time 4   Period Weeks   Status On-going   PT LONG TERM GOAL #6   Title Pt SLS to be 5-10 seconds B to decrease risk of falling   Time 4          Plan - 04/22/14 1513    Clinical Impression Statement Continued focus on improving postural stability, core and LE strength.  Added SLS with significant difficulty completing without use of UE's.  STS was also difficult without use of UE's.  Pt requires constant VC's to complete actvities in upright posture as tends to have forward bent cervical and lumbar posturing. Pt able to complete all actvities without c/o pain.    PT Next Visit Plan Continue to progress strength and stability.  Increase focus on standing actvities and add nustep to increase actvitiy tolerance.         Problem List Patient Active Problem List   Diagnosis Date Noted  .  Pain in joint, lower leg 07/05/2011  . Muscle weakness (generalized) 07/05/2011  . Sciatica of right side 06/30/2011  . DDD (degenerative disc disease), lumbar 06/30/2011  . HYPERTENSION 06/19/2009  . GERD 06/19/2009  . CONSTIPATION 06/19/2009                                              Teena Irani, PTA/CLT [phone] 04/22/2014, 3:19 PM

## 2014-04-24 ENCOUNTER — Ambulatory Visit (HOSPITAL_COMMUNITY)
Admission: RE | Admit: 2014-04-24 | Discharge: 2014-04-24 | Disposition: A | Discharge status: Home / Self Care | Payer: PRIVATE HEALTH INSURANCE | Source: Ambulatory Visit | Attending: Internal Medicine | Admitting: Internal Medicine

## 2014-04-24 DIAGNOSIS — R29898 Other symptoms and signs involving the musculoskeletal system: Secondary | ICD-10-CM

## 2014-04-24 DIAGNOSIS — Z5189 Encounter for other specified aftercare: Secondary | ICD-10-CM | POA: Diagnosis not present

## 2014-04-24 DIAGNOSIS — IMO0001 Reserved for inherently not codable concepts without codable children: Secondary | ICD-10-CM

## 2014-04-24 NOTE — Therapy (Signed)
[facility] [address] [place], [place], [address] Phone: [phone]   Fax:  [phone]  Physical Therapy Treatment  Patient Details  Name: Melissa Rosales MRN: [id number] Date of Birth: Sep 30, 1945  Encounter Date: 04/24/2014      PT End of Session - 04/24/14 1523    Visit Number 5   Number of Visits [place] - Visit Number 5   Authorization - Number of Visits 8   PT Start Time 1062   PT Stop Time 1520   PT Time Calculation (min) 45 min   Activity Tolerance Patient tolerated treatment well      Past Medical History  Diagnosis Date  . Hypertension   . Arthritis   . Acid reflux     Past Surgical History  Procedure Laterality Date  . Vaginal hysterectomy    . Vision problems since birth      There were no vitals taken for this visit.  Visit Diagnosis:  Radicular pain of right lower back  Leg weakness, bilateral      Subjective Assessment - 04/24/14 1533    Symptoms Pt reports no significant changes.  Pain remains at 5/10.  REports compliance with HEP.   Currently in Pain? Yes   Pain Score 5    Pain Location Back   Pain Orientation Right            [facility] Adult PT Treatment/Exercise - 04/24/14 1447    Lumbar Exercises: Stretches   Active Hamstring Stretch 3 reps;30 seconds   Active Hamstring Stretch Limitations standing with 10" box   ITB Stretch Limitations   ITB Stretch Limitations gastroc slant board 3X30"   Lumbar Exercises: Aerobic   Stationary Bike nustep level 2 hills #2 8 minutes UE/LE   Lumbar Exercises: Standing   Heel Raises 15 reps   Heel Raises Limitations toeraises 15 reps   Functional Squats 10 reps   Functional Squats Limitations 3D hip excursions   Scapular Retraction 10 reps;Theraband   Theraband Level (Scapular Retraction) Level 2 (Red)   Row 10 reps;Theraband   Theraband Level (Row) Level 2 (Red)   Shoulder Extension 10 reps;Theraband   Theraband  Level (Shoulder Extension) Level 2 (Red)   Other Standing Lumbar Exercises SLS max of 5 R:3", Lt: 3"   Other Standing Lumbar Exercises step ups lateral and forward 4" 10 reps each 1 HHA                Plan - 04/24/14 1525    Clinical Impression Statement Progressed to standing actvities today, adding step ups and 3D hip excursions.  Also completed hamstring stretch in standing.  Pt with noted weakness and discomfort in Rt LE with step ups requiring therapist facilitation to decrease hip substitution with eccentric lowering phase.  Pt  with improved form completing postural theraband exercises.  Also added nustep to increase actvity tolerance.   Pt without c/o pain during session today.   PT Next Visit Plan Continue to progress strength and stability.  Increase focus on standing actvities and progress nustep SPM.   Progress with lunges and increase squat depth and reliance on UE's for activities.         Problem List Patient Active Problem List   Diagnosis Date Noted  . Pain in joint, lower leg 07/05/2011  . Muscle weakness (generalized) 07/05/2011  . Sciatica of right side 06/30/2011  . DDD (degenerative disc disease), lumbar 06/30/2011  .  HYPERTENSION 06/19/2009  . GERD 06/19/2009  . CONSTIPATION 06/19/2009    Teena Irani, PTA/CLT [phone] 04/24/2014, 3:36 PM

## 2014-04-29 ENCOUNTER — Ambulatory Visit (HOSPITAL_COMMUNITY)
Admission: RE | Admit: 2014-04-29 | Discharge: 2014-04-29 | Disposition: A | Discharge status: Home / Self Care | Payer: PRIVATE HEALTH INSURANCE | Source: Ambulatory Visit | Attending: Internal Medicine | Admitting: Internal Medicine

## 2014-04-29 DIAGNOSIS — R29898 Other symptoms and signs involving the musculoskeletal system: Secondary | ICD-10-CM

## 2014-04-29 DIAGNOSIS — Z5189 Encounter for other specified aftercare: Secondary | ICD-10-CM | POA: Diagnosis not present

## 2014-04-29 DIAGNOSIS — IMO0001 Reserved for inherently not codable concepts without codable children: Secondary | ICD-10-CM

## 2014-04-29 NOTE — Therapy (Signed)
[facility] [address] [place], [place], [address] Phone: [phone]   Fax:  [phone]  Physical Therapy Treatment  Patient Details  Name: Melissa Rosales MRN: [id number] Date of Birth: Aug 16, 1945  Encounter Date: 04/29/2014      PT End of Session - 04/29/14 1427    Visit Number 6   Number of Visits 8   Date for PT Re-Evaluation 05/03/14   Authorization Type [employer] medicare   Authorization - Visit Number 6   Authorization - Number of Visits 8   PT Start Time 7517   PT Stop Time 1440   PT Time Calculation (min) 45 min   Activity Tolerance Patient tolerated treatment well      Past Medical History  Diagnosis Date  . Hypertension   . Arthritis   . Acid reflux     Past Surgical History  Procedure Laterality Date  . Vaginal hysterectomy    . Vision problems since birth      There were no vitals taken for this visit.  Visit Diagnosis:  Radicular pain of right lower back  Leg weakness, bilateral      Subjective Assessment - 04/29/14 1413    Symptoms Pt states no real back pain all her pain is in her leg from her knee all the way down to her ankle    How long can you sit comfortably? no problem   How long can you stand comfortably? stand for 10-15 '   How long can you walk comfortably? 10-15'   Pain Score 5    Pain Location Leg   Pain Orientation Right;Lateral   Pain Descriptors / Indicators Tingling            [facility] Adult PT Treatment/Exercise - 04/29/14 1414    Lumbar Exercises: Stretches   Active Hamstring Stretch 3 reps;30 seconds   Active Hamstring Stretch Limitations standing with 10" box   Prone on Elbows Stretch 60 seconds   Press Ups 5 reps   Lumbar Exercises: Aerobic   Stationary Bike nustep level 2 hills #2 8 minutes UE/LE   Lumbar Exercises: Standing   Heel Raises 15 reps   Functional Squats 10 reps   Functional Squats Limitations with lifting yellow ball off box    Forward Lunge 10 reps   Scapular  Retraction 10 reps;Theraband   Theraband Level (Scapular Retraction) Level 2 (Red)   Row 10 reps;Theraband   Theraband Level (Row) Level 2 (Red)   Shoulder Extension 10 reps;Theraband   Theraband Level (Shoulder Extension) Level 2 (Red)   Other Standing Lumbar Exercises 3 -D hip excursion   Lumbar Exercises: Seated   Other Seated Lumbar Exercises thoracic matrix x 3    Lumbar Exercises: Supine   Bridge 15 reps   Straight Leg Raise 10 reps   Lumbar Exercises: Sidelying   Hip Abduction 10 reps   Lumbar Exercises: Prone   Straight Leg Raise 10 reps   Opposite Arm/Leg Raise 10 reps   Other Prone Lumbar Exercises heelsqueezes 15 reps, 5" holds            PT Short Term Goals - 04/29/14 1434    PT SHORT TERM GOAL #1   Title I in HEP   Time 1   Period Weeks   Status Achieved   PT SHORT TERM GOAL #2   Title Pt radicular sx no further down her leg than knee level   Time 2   Period Weeks   Status On-going  PT SHORT TERM GOAL #3   Title Pt to be able to be up for 30 minutes before radicular sx occur.    Time 2   Period Weeks   Status On-going          PT Long Term Goals - 04/29/14 1440    PT LONG TERM GOAL #1   Title I in advance HEP   Time 4   Period Weeks   Status Achieved   PT LONG TERM GOAL #2   Title Pt to state she is able to be up for 30 minutes without radicular sx    Time 4   Period Weeks   Status On-going   PT LONG TERM GOAL #3   Title Back pain no greater than a 4/10 80 % of the time   Time 4   Period Weeks   Status On-going   PT LONG TERM GOAL #4   Title Pt to be able to go up and down steps in reciprocal manner    Time 4   Period Weeks   Status On-going          Plan - 04/29/14 1433    Pt will benefit from skilled therapeutic intervention in order to improve on the following deficits Decreased balance;Decreased activity tolerance;Pain;Decreased range of motion;Decreased strength;Difficulty walking   Rehab Potential Good   PT Frequency 2x /  week   PT Duration 4 weeks   PT Treatment/Interventions Therapeutic activities;Therapeutic exercise;Balance training;Patient/family education   PT Next Visit Plan Begin abdominal work with ball next period.         Problem List Patient Active Problem List   Diagnosis Date Noted  . Pain in joint, lower leg 07/05/2011  . Muscle weakness (generalized) 07/05/2011  . Sciatica of right side 06/30/2011  . DDD (degenerative disc disease), lumbar 06/30/2011  . HYPERTENSION 06/19/2009  . GERD 06/19/2009  . CONSTIPATION 06/19/2009  Azucena Freed PT/CLT [phone] 04/29/2014, 2:41 PM

## 2014-05-01 ENCOUNTER — Ambulatory Visit (HOSPITAL_COMMUNITY)
Admission: RE | Admit: 2014-05-01 | Discharge: 2014-05-01 | Disposition: A | Discharge status: Home / Self Care | Payer: PRIVATE HEALTH INSURANCE | Source: Ambulatory Visit | Attending: Internal Medicine | Admitting: Internal Medicine

## 2014-05-01 DIAGNOSIS — R29898 Other symptoms and signs involving the musculoskeletal system: Secondary | ICD-10-CM

## 2014-05-01 DIAGNOSIS — Z5189 Encounter for other specified aftercare: Secondary | ICD-10-CM | POA: Diagnosis not present

## 2014-05-01 DIAGNOSIS — IMO0001 Reserved for inherently not codable concepts without codable children: Secondary | ICD-10-CM

## 2014-05-01 NOTE — Therapy (Signed)
[facility] [address] [place], [place], [address] Phone: [phone]   Fax:  [phone]  Physical Therapy Treatment  Patient Details  Name: Melissa Rosales MRN: [id number] Date of Birth: 1945-11-22  Encounter Date: 05/01/2014      PT End of Session - 05/01/14 1525    Visit Number 7   Number of Visits 8   Date for PT Re-Evaluation 05/03/14   Authorization Type [employer] medicare   Authorization - Visit Number 7   Authorization - Number of Visits 8   PT Start Time 1440   PT Stop Time 1528   PT Time Calculation (min) 48 min      Past Medical History  Diagnosis Date  . Hypertension   . Arthritis   . Acid reflux     Past Surgical History  Procedure Laterality Date  . Vaginal hysterectomy    . Vision problems since birth      There were no vitals taken for this visit.  Visit Diagnosis:  Radicular pain of right lower back  Leg weakness, bilateral      Subjective Assessment - 05/01/14 1443    Symptoms Pt states her Rt knee is hurting from the knee down    Pain Score 4             [facility] Adult PT Treatment/Exercise - 05/01/14 1449    Lumbar Exercises: Stretches   Single Knee to Chest Stretch 3 reps;30 seconds   Press Ups 5 reps  x2   Lumbar Exercises: Aerobic   Stationary Bike nustep hills 3; lv 4 x 10:00   Lumbar Exercises: Seated   Sit to Stand 10 reps   Other Seated Lumbar Exercises ' x tp V x 10 both with 1#    Other Seated Lumbar Exercises thoracic matrix x 3    Lumbar Exercises: Supine   Bridge 20 reps   Straight Leg Raise 15 reps   Large Ball Abdominal Isometric 10 reps   Large Ball Oblique Isometric 10 reps   Lumbar Exercises: Sidelying   Hip Abduction 15 reps   Lumbar Exercises: Prone   Straight Leg Raise 10 reps   Opposite Arm/Leg Raise 15 reps   Other Prone Lumbar Exercises Bshld extension with 1 # x 10           PT Education - 05/01/14 1525    Education provided Yes   Education Details body mechanics  sit to stand pt continues to stress back    Person(s) Educated Patient   Methods Explanation   Comprehension Verbalized understanding;Returned demonstration          PT Short Term Goals - 05/01/14 1528    PT SHORT TERM GOAL #1   Title I in HEP   Time 1   Period Weeks   Status Achieved   PT SHORT TERM GOAL #2   Title Pt radicular sx no further down her leg than knee level   Time 2   Period Weeks   Status Not Met   PT SHORT TERM GOAL #3   Title Pt to be able to be up for 30 minutes before radicular sx occur.    Time 2   Period Weeks   Status On-going          PT Long Term Goals - 05/01/14 1528    PT LONG TERM GOAL #1   Title I in advance HEP   Time 4   Period Weeks   Status Achieved  PT LONG TERM GOAL #2   Title Pt to state she is able to be up for 30 minutes without radicular sx    Time 4   Period Weeks   Status On-going   PT LONG TERM GOAL #3   Title Back pain no greater than a 4/10 80 % of the time   Time 4   Period Weeks   Status On-going   PT LONG TERM GOAL #4   Title Pt to be able to go up and down steps in reciprocal manner    Time 4   Period Weeks   Status Achieved   PT LONG TERM GOAL #5   Title LE strength improved one grade to allow the above to occur   Time 4   Period Weeks   Status Achieved   PT LONG TERM GOAL #6   Title Pt SLS to be 5-10 seconds B to decrease risk of falling   Time 4          Plan - 05/01/14 1526    Clinical Impression Statement Pt improving in form with exercises but continues to need therapist facilitation to correctly perform.  Pt added ball exercises to improve abdominal mm.  Pt voices improvement of pain at end of session    Pt will benefit from skilled therapeutic intervention in order to improve on the following deficits Decreased balance;Decreased activity tolerance;Pain;Decreased range of motion;Decreased strength;Difficulty walking   Rehab Potential Good   PT Next Visit Plan re-evaluate next visit             Problem List Patient Active Problem List   Diagnosis Date Noted  . Pain in joint, lower leg 07/05/2011  . Muscle weakness (generalized) 07/05/2011  . Sciatica of right side 06/30/2011  . DDD (degenerative disc disease), lumbar 06/30/2011  . HYPERTENSION 06/19/2009  . GERD 06/19/2009  . CONSTIPATION 06/19/2009  Azucena Freed PT/CLT [phone]  05/01/2014, 3:34 PM

## 2014-05-07 ENCOUNTER — Ambulatory Visit (HOSPITAL_COMMUNITY)
Admission: RE | Admit: 2014-05-07 | Discharge: 2014-05-07 | Disposition: A | Discharge status: Home / Self Care | Payer: PRIVATE HEALTH INSURANCE | Source: Ambulatory Visit | Attending: Internal Medicine | Admitting: Internal Medicine

## 2014-05-07 DIAGNOSIS — Z5189 Encounter for other specified aftercare: Secondary | ICD-10-CM | POA: Diagnosis not present

## 2014-05-07 DIAGNOSIS — R29898 Other symptoms and signs involving the musculoskeletal system: Secondary | ICD-10-CM

## 2014-05-07 DIAGNOSIS — IMO0001 Reserved for inherently not codable concepts without codable children: Secondary | ICD-10-CM

## 2014-05-07 NOTE — Therapy (Signed)
[facility] [place], [place], [address] Phone: [phone]   Fax:  [phone]  Physical Therapy Reassessment Patient Details  Name: Melissa Rosales MRN: [id number] Date of Birth: [birth year]  Encounter Date: 05/07/2014      PT End of Session - 05/07/14 1523    Visit Number 8   Number of Visits 16   Date for PT Re-Evaluation 06/06/14   Authorization Type [employer] medicare   Authorization - Visit Number 8   Authorization - Number of Visits 10   PT Start Time 4097  Pt late for appointment    PT Stop Time 1527   PT Time Calculation (min) 37 min   Activity Tolerance Patient tolerated treatment well      Past Medical History  Diagnosis Date  . Hypertension   . Arthritis   . Acid reflux     Past Surgical History  Procedure Laterality Date  . Vaginal hysterectomy    . Vision problems since birth      There were no vitals taken for this visit.  Visit Diagnosis:  Radicular pain of right lower back  Leg weakness, bilateral      Subjective Assessment - 05/07/14 1506    Symptoms Ms. Bilger states that she has been feeling pretty good.  Right now she does not have any pain.   Pt states she feels that she is about 30 % better she would like to continue with therapy           [facility] PT Assessment - 05/07/14 1455    Assessment   Medical Diagnosis radicular low back pain   Precautions   Precautions None   Restrictions   Weight Bearing Restrictions No   Balance Screen   Has the patient fallen in the past 6 months No   Has the patient had a decrease in activity level because of a fear of falling?  No   Is the patient reluctant to leave their home because of a fear of falling?  No   Observation/Other Assessments   Focus on Therapeutic Outcomes (FOTO)  60    Posture/Postural Control   Posture/Postural Control Postural limitations   Postural Limitations Rounded Shoulders;Forward head;Increased thoracic kyphosis;Decreased lumbar  lordosis   AROM   Lumbar Flexion wfl was decreased 30%   Lumbar Extension wfl was decreased 15%   Lumbar - Right Side Bend wfl   Lumbar - Left Side Bend wfl was decreased 20%   Strength   Right Hip Flexion 3+/5  was 2+/5   Right Hip Extension --  4-/5 was 3/5   Right Hip ABduction 3+/5  was 3+/5    Left Hip Flexion 3+/5   Left Hip Extension 3+/5  was 3+/5    Left Hip ABduction 3+/5  was 3/5   Right Knee Flexion --  4+/5 was 3+/5    Right Knee Extension 5/5  was 3+/5   Left Knee Flexion --  4/5 was 3+/5    Left Knee Extension 5/5   Right Ankle Dorsiflexion 3+/5  was 3+/5   Left Ankle Dorsiflexion 5/5   Balance   Balance Assessed --  unable to stand for one second on either foot.          [facility] Adult PT Treatment/Exercise - 05/07/14 1455    Lumbar Exercises: Aerobic   Stationary Bike nustep L5; hills 3 x 10:00   Lumbar Exercises: Standing   Heel Raises 10 reps   Heel Raises Limitations combined with  squats   Functional Squats 10 reps   Other Standing Lumbar Exercises SLS x 5    Other Standing Lumbar Exercises 3-D hip excursion x 3; steps 2 RT 4" side    Lumbar Exercises: Seated   Sit to Stand 10 reps            PT Short Term Goals - 05/07/14 1504    PT SHORT TERM GOAL #1   Title I in HEP   Time 1   Period Weeks   Status Achieved   PT SHORT TERM GOAL #2   Title Pt radicular sx no further down her leg than knee level   Time 2   Period Weeks   Status Partially Met   PT SHORT TERM GOAL #3   Title Pt to be able to be up for 30 minutes before radicular sx occur.    Time 2   Status Not Met          PT Long Term Goals - 05/07/14 1505    PT LONG TERM GOAL #1   Title I in advance HEP   Time 4   Period Weeks   Status Achieved   PT LONG TERM GOAL #2   Title Pt to state she is able to be up for 30 minutes without radicular sx    Period Weeks   Status Not Met   PT LONG TERM GOAL #3   Title Back pain no greater than a 4/10 80 % of the time   Time 4    Period Weeks   Status On-going   PT LONG TERM GOAL #4   Title Pt to be able to go up and down steps in reciprocal manner    Time 4   Period Weeks   Status Achieved   PT LONG TERM GOAL #5   Title LE strength improved one grade to allow the above to occur   Time 4   Period Weeks   Status Partially Met   Additional Long Term Goals   Additional Long Term Goals Yes   PT LONG TERM GOAL #6   Title Pt SLS to be 5-10 seconds B to decrease risk of falling   Time 4  not met           Plan - 05/07/14 1525    Clinical Impression Statement Pt has improved in ROM and strrength although she still has significant weakness in her core and LE strength as well as decreased balance  .  Pt will benefit from continued skilled therapy to increase core and LE strength to decrease pain and improve the pt qualilty of life    Pt will benefit from skilled therapeutic intervention in order to improve on the following deficits Decreased balance;Decreased activity tolerance;Decreased strength;Pain   PT Frequency 2x / week   PT Duration 8 weeks  four additional weeks to reach long term goals   PT Treatment/Interventions Therapeutic activities;Therapeutic exercise;Balance training;Patient/family education   PT Next Visit Plan begin more WB activity such as lunges, wall squats, side step with t-band as well as mat activity ball to progress core strength        Problem List Patient Active Problem List   Diagnosis Date Noted  . Pain in joint, lower leg 07/05/2011  . Muscle weakness (generalized) 07/05/2011  . Sciatica of right side 06/30/2011  . DDD (degenerative disc disease), lumbar 06/30/2011  . HYPERTENSION 06/19/2009  . GERD 06/19/2009  . CONSTIPATION 06/19/2009   Azucena Freed PT/CLT (  [phone]   05/07/2014, 3:31 PM

## 2014-05-19 ENCOUNTER — Ambulatory Visit (HOSPITAL_COMMUNITY)
Admission: RE | Admit: 2014-05-19 | Discharge: 2014-05-19 | Disposition: A | Discharge status: Home / Self Care | Payer: PRIVATE HEALTH INSURANCE | Source: Ambulatory Visit | Attending: Internal Medicine | Admitting: Internal Medicine

## 2014-05-19 DIAGNOSIS — R29898 Other symptoms and signs involving the musculoskeletal system: Secondary | ICD-10-CM

## 2014-05-19 DIAGNOSIS — IMO0001 Reserved for inherently not codable concepts without codable children: Secondary | ICD-10-CM

## 2014-05-19 DIAGNOSIS — Z5189 Encounter for other specified aftercare: Secondary | ICD-10-CM | POA: Diagnosis not present

## 2014-05-19 NOTE — Therapy (Signed)
[facility] [address] [place], [place], [address] Phone: [phone]   Fax:  [phone]  Physical Therapy Treatment  Patient Details  Name: Melissa Rosales MRN: [id number] Date of Birth: 1946-03-14  Encounter Date: 05/19/2014      PT End of Session - 05/19/14 1619    Visit Number 9   Number of Visits 16   Authorization Type [employer] medicare   Authorization - Visit Number 9   Authorization - Number of Visits 10   PT Start Time 1438   PT Stop Time 1520   PT Time Calculation (min) 42 min   Equipment Utilized During Treatment Gait belt   Activity Tolerance Patient tolerated treatment well      Past Medical History  Diagnosis Date  . Hypertension   . Arthritis   . Acid reflux     Past Surgical History  Procedure Laterality Date  . Vaginal hysterectomy    . Vision problems since birth      There were no vitals taken for this visit.  Visit Diagnosis:  Radicular pain of right lower back  Leg weakness, bilateral      Subjective Assessment - 05/19/14 1514    Symptoms Pt states she is doing alright states the rainy weather is getting to her.   Pain Score 5    Pain Location Leg   Pain Orientation Right                    [facility] Adult PT Treatment/Exercise - 05/19/14 0001    Exercises   Exercises Lumbar   Lumbar Exercises: Stretches   Gastroc Stretch Limitations gastroc stretch 30 sec x 3   Lumbar Exercises: Aerobic   Stationary Bike nustep L5; hills 3 x 10:00   Lumbar Exercises: Standing   Heel Raises 10 reps   Heel Raises Limitations combined with squats lifting  red ball   Functional Squats 10 reps   Forward Lunge 10 reps   Wall Slides 10 reps   Theraband Level (Shoulder Adduction) --  marching keeping good posture x 10    Shoulder Adduction Limitations tandem gt x 2 RT    Other Standing Lumbar Exercises tandem stance x 30 sec x 2;    Other Standing Lumbar Exercises 3-D hip excursion x 3; steps 2 RT  4" side    Lumbar Exercises: Seated   Other Seated Lumbar Exercises w back with 2 # x 10   Other Seated Lumbar Exercises thoracic matrix x 3                   PT Short Term Goals - 05/07/14 1504    PT SHORT TERM GOAL #1   Title I in HEP   Time 1   Period Weeks   Status Achieved   PT SHORT TERM GOAL #2   Title Pt radicular sx no further down her leg than knee level   Time 2   Period Weeks   Status Partially Met   PT SHORT TERM GOAL #3   Title Pt to be able to be up for 30 minutes before radicular sx occur.    Time 2   Status Not Met           PT Long Term Goals - 05/07/14 1505    PT LONG TERM GOAL #1   Title I in advance HEP   Time 4   Period Weeks   Status Achieved   PT LONG  TERM GOAL #2   Title Pt to state she is able to be up for 30 minutes without radicular sx    Period Weeks   Status Not Met   PT LONG TERM GOAL #3   Title Back pain no greater than a 4/10 80 % of the time   Time 4   Period Weeks   Status On-going   PT LONG TERM GOAL #4   Title Pt to be able to go up and down steps in reciprocal manner    Time 4   Period Weeks   Status Achieved   PT LONG TERM GOAL #5   Title LE strength improved one grade to allow the above to occur   Time 4   Period Weeks   Status Partially Met   Additional Long Term Goals   Additional Long Term Goals Yes   PT LONG TERM GOAL #6   Title Pt SLS to be 5-10 seconds B to decrease risk of falling   Time 4  not met                Plan - 05/19/14 1619    Clinical Impression Statement Pt completed all new exercises with good technique after therapist facilitation.  Pt continues to have weak core mm but has improved activation with verbal cuing    PT Next Visit Plan resume abdominal work with physioball as well as all 4 opposite arm/leg exercise.        Problem List Patient Active Problem List   Diagnosis Date Noted  . Pain in joint, lower leg 07/05/2011  . Muscle weakness (generalized) 07/05/2011   . Sciatica of right side 06/30/2011  . DDD (degenerative disc disease), lumbar 06/30/2011  . HYPERTENSION 06/19/2009  . GERD 06/19/2009  . CONSTIPATION 06/19/2009    RUSSELL,CINDY PT  05/19/2014, 4:22 PM  [facility] [address] [place], [place], [address] Phone: [phone]   Fax:  [phone]

## 2014-05-27 ENCOUNTER — Ambulatory Visit (HOSPITAL_COMMUNITY)
Admission: RE | Admit: 2014-05-27 | Discharge: 2014-05-27 | Disposition: A | Discharge status: Home / Self Care | Payer: Medicare Other | Source: Ambulatory Visit | Attending: Internal Medicine | Admitting: Internal Medicine

## 2014-05-27 DIAGNOSIS — M25661 Stiffness of right knee, not elsewhere classified: Secondary | ICD-10-CM | POA: Diagnosis not present

## 2014-05-27 DIAGNOSIS — R29898 Other symptoms and signs involving the musculoskeletal system: Secondary | ICD-10-CM

## 2014-05-27 DIAGNOSIS — M546 Pain in thoracic spine: Secondary | ICD-10-CM | POA: Insufficient documentation

## 2014-05-27 DIAGNOSIS — M79661 Pain in right lower leg: Secondary | ICD-10-CM | POA: Diagnosis not present

## 2014-05-27 DIAGNOSIS — IMO0001 Reserved for inherently not codable concepts without codable children: Secondary | ICD-10-CM

## 2014-05-27 DIAGNOSIS — Z5189 Encounter for other specified aftercare: Secondary | ICD-10-CM | POA: Insufficient documentation

## 2014-05-27 DIAGNOSIS — M545 Low back pain: Secondary | ICD-10-CM | POA: Insufficient documentation

## 2014-05-27 NOTE — Therapy (Signed)
[facility] [address] [place], [place], [address] Phone: [phone]   Fax:  [phone]  Physical Therapy Treatment  Patient Details  Name: Melissa Rosales MRN: [id number] Date of Birth: 12/26/1945  Encounter Date: 05/27/2014      PT End of Session - 05/27/14 1447    Visit Number 10   Number of Visits [place]; [place] taken 12/16 (on visit #8) at 60% (was 60%)   Authorization - Visit Number 10   Authorization - Number of Visits 20   PT Start Time 1435   PT Stop Time 1515   PT Time Calculation (min) 40 min   Equipment Utilized During Treatment Gait belt   Activity Tolerance Patient tolerated treatment well      Past Medical History  Diagnosis Date  . Hypertension   . Arthritis   . Acid reflux     Past Surgical History  Procedure Laterality Date  . Vaginal hysterectomy    . Vision problems since birth      There were no vitals taken for this visit.  Visit Diagnosis:  Radicular pain of right lower back  Leg weakness, bilateral      Subjective Assessment - 05/27/14 1445    Symptoms Pt states she is not hurting at all in her lumbar region today, only having knee pain from arthritis and the cold weather.   Currently in Pain? No/denies                    [facility] Adult PT Treatment/Exercise - 05/27/14 1435    Lumbar Exercises: Stretches   ITB Stretch Limitations gastroc stretch 30 sec x 3   Lumbar Exercises: Aerobic   Stationary Bike nustep L5; hills 3 x 10:00   Lumbar Exercises: Standing   Heel Raises 10 reps   Heel Raises Limitations combined with squats lifting  red ball   Forward Lunge 10 reps   Forward Lunge Limitations onto 6" step   Lumbar Exercises: Seated   Other Seated Lumbar Exercises w back with 2 # x 10   Other Seated Lumbar Exercises thoracic matrix x 3    Lumbar Exercises: Supine   Bridge 20 reps   Large Ball Abdominal Isometric 10 reps   Large Ball Oblique  Isometric 10 reps   Lumbar Exercises: Quadruped   Opposite Arm/Leg Raise 10 reps                  PT Short Term Goals - 05/07/14 1504    PT SHORT TERM GOAL #1   Title I in HEP   Time 1   Period Weeks   Status Achieved   PT SHORT TERM GOAL #2   Title Pt radicular sx no further down her leg than knee level   Time 2   Period Weeks   Status Partially Met   PT SHORT TERM GOAL #3   Title Pt to be able to be up for 30 minutes before radicular sx occur.    Time 2   Status Not Met           PT Long Term Goals - 05/07/14 1505    PT LONG TERM GOAL #1   Title I in advance HEP   Time 4   Period Weeks   Status Achieved   PT LONG TERM GOAL #2   Title Pt to state she is able to be up for 30 minutes without radicular sx  Period Weeks   Status Not Met   PT LONG TERM GOAL #3   Title Back pain no greater than a 4/10 80 % of the time   Time 4   Period Weeks   Status On-going   PT LONG TERM GOAL #4   Title Pt to be able to go up and down steps in reciprocal manner    Time 4   Period Weeks   Status Achieved   PT LONG TERM GOAL #5   Title LE strength improved one grade to allow the above to occur   Time 4   Period Weeks   Status Partially Met   Additional Long Term Goals   Additional Long Term Goals Yes   PT LONG TERM GOAL #6   Title Pt SLS to be 5-10 seconds B to decrease risk of falling   Time 4  not met                Plan - 05/27/14 1539    Clinical Impression Statement Progressed with opposite arm/leg in quadruped and resumed abdominal ball actvity per PT plan.  Pt required vc's with proper breathing technique with supine actvities as well as postural cues with standing actvities.     PT Next Visit Plan Continue to progress stability and dynamic balance.  Advance balance actvities as able.           G-Codes - June 10, 2014 1714    Functional Assessment Tool Used foto   Functional Limitation Mobility: Walking and moving around   Mobility: Walking and  Moving Around Current Status [phone]) At least 40 percent but less than 60 percent impaired, limited or restricted   Mobility: Walking and Moving Around Goal Status [phone]) At least 20 percent but less than 40 percent impaired, limited or restricted      Problem List Patient Active Problem List   Diagnosis Date Noted  . Pain in joint, lower leg 07/05/2011  . Muscle weakness (generalized) 07/05/2011  . Sciatica of right side 06/30/2011  . DDD (degenerative disc disease), lumbar 06/30/2011  . HYPERTENSION 06/19/2009  . GERD 06/19/2009  . CONSTIPATION 06/19/2009    Teena Irani, PTA/CLT [phone] 2014-06-10, 5:14 PM  [facility] [address] [place], [place], [address] Phone: [phone]   Fax:  [phone]

## 2014-05-28 DIAGNOSIS — M546 Pain in thoracic spine: Secondary | ICD-10-CM | POA: Diagnosis not present

## 2014-05-28 DIAGNOSIS — M25661 Stiffness of right knee, not elsewhere classified: Secondary | ICD-10-CM | POA: Diagnosis not present

## 2014-05-28 DIAGNOSIS — M79661 Pain in right lower leg: Secondary | ICD-10-CM | POA: Diagnosis not present

## 2014-05-28 DIAGNOSIS — M545 Low back pain: Secondary | ICD-10-CM | POA: Diagnosis not present

## 2014-05-28 DIAGNOSIS — Z5189 Encounter for other specified aftercare: Secondary | ICD-10-CM | POA: Diagnosis not present

## 2014-05-29 ENCOUNTER — Ambulatory Visit (HOSPITAL_COMMUNITY)
Admission: RE | Admit: 2014-05-29 | Discharge: 2014-05-29 | Disposition: A | Discharge status: Home / Self Care | Payer: Medicare Other | Source: Ambulatory Visit | Attending: Internal Medicine | Admitting: Internal Medicine

## 2014-05-29 DIAGNOSIS — M25661 Stiffness of right knee, not elsewhere classified: Secondary | ICD-10-CM | POA: Diagnosis not present

## 2014-05-29 DIAGNOSIS — M79661 Pain in right lower leg: Secondary | ICD-10-CM | POA: Diagnosis not present

## 2014-05-29 DIAGNOSIS — IMO0001 Reserved for inherently not codable concepts without codable children: Secondary | ICD-10-CM

## 2014-05-29 DIAGNOSIS — Z5189 Encounter for other specified aftercare: Secondary | ICD-10-CM | POA: Diagnosis not present

## 2014-05-29 DIAGNOSIS — R29898 Other symptoms and signs involving the musculoskeletal system: Secondary | ICD-10-CM

## 2014-05-29 DIAGNOSIS — M546 Pain in thoracic spine: Secondary | ICD-10-CM | POA: Diagnosis not present

## 2014-05-29 DIAGNOSIS — M545 Low back pain: Secondary | ICD-10-CM | POA: Diagnosis not present

## 2014-05-29 NOTE — Therapy (Signed)
[facility] [place], [place], [address] Phone: [phone]   Fax:  [phone]  Physical Therapy Treatment  Patient Details  Name: Melissa Rosales MRN: [id number] Date of Birth: [birth year] Referring Provider:  Redmond School, MD  Encounter Date: 05/29/2014      PT End of Session - 05/29/14 1505    Visit Number 11   Number of Visits [place]; [place] taken 12/16 (on visit #8) at 60% (was 60%)   Authorization - Visit Number 11   Authorization - Number of Visits 20   PT Start Time 1430   PT Stop Time 1515   PT Time Calculation (min) 45 min   Equipment Utilized During Treatment Gait belt   Activity Tolerance Patient tolerated treatment well      Past Medical History  Diagnosis Date  . Hypertension   . Arthritis   . Acid reflux     Past Surgical History  Procedure Laterality Date  . Vaginal hysterectomy    . Vision problems since birth      There were no vitals taken for this visit.  Visit Diagnosis:  Radicular pain of right lower back  Leg weakness, bilateral      Subjective Assessment - 05/29/14 1521    Symptoms Pt states her pain continues to improve.  States her knees remain sore.    Currently in Pain? No/denies              [facility] Adult PT Treatment/Exercise - 05/29/14 1431    Exercises   Exercises Lumbar   Lumbar Exercises: Stretches   ITB Stretch Limitations gastroc stretch 30 sec x 3   Lumbar Exercises: Aerobic   Stationary Bike nustep L5; hills 3 x 10:00   Lumbar Exercises: Standing   Heel Raises 10 reps   Heel Raises Limitations combined with squats lifting  red ball   Forward Lunge 15 reps   Forward Lunge Limitations onto 4" step   Other Standing Lumbar Exercises 3-D hip excursion x 3; steps 3 RT 4" side    Lumbar Exercises: Seated   Other Seated Lumbar Exercises w back with 2 # x 10   Lumbar Exercises: Supine   Bridge 20 reps   Straight Leg Raise 15 reps    Large Ball Abdominal Isometric 10 reps   Large Ball Oblique Isometric 10 reps   Lumbar Exercises: Quadruped   Opposite Arm/Leg Raise 10 reps           PT Short Term Goals - 05/07/14 1504    PT SHORT TERM GOAL #1   Title I in HEP   Time 1   Period Weeks   Status Achieved   PT SHORT TERM GOAL #2   Title Pt radicular sx no further down her leg than knee level   Time 2   Period Weeks   Status Partially Met   PT SHORT TERM GOAL #3   Title Pt to be able to be up for 30 minutes before radicular sx occur.    Time 2   Status Not Met           PT Long Term Goals - 05/07/14 1505    PT LONG TERM GOAL #1   Title I in advance HEP   Time 4   Period Weeks   Status Achieved   PT LONG TERM GOAL #2   Title Pt to state she is able to be up for 30  minutes without radicular sx    Period Weeks   Status Not Met   PT LONG TERM GOAL #3   Title Back pain no greater than a 4/10 80 % of the time   Time 4   Period Weeks   Status On-going   PT LONG TERM GOAL #4   Title Pt to be able to go up and down steps in reciprocal manner    Time 4   Period Weeks   Status Achieved   PT LONG TERM GOAL #5   Title LE strength improved one grade to allow the above to occur   Time 4   Period Weeks   Status Partially Met   Additional Long Term Goals   Additional Long Term Goals Yes   PT LONG TERM GOAL #6   Title Pt SLS to be 5-10 seconds B to decrease risk of falling   Time 4  not met            Plan - 05/29/14 1517    Clinical Impression Statement Pt with improved stability today completeing Opposite UE/LE in quadruped.  Continued need for cues with squats and lunges to complete in correct form and good posture.  Overall improving with decreasing symptoms.   PT Next Visit Plan Continue to progress stability and dynamic balance.  Advance balance actvities as able.       Problem List Patient Active Problem List   Diagnosis Date Noted  . Pain in joint, lower leg 07/05/2011  . Muscle  weakness (generalized) 07/05/2011  . Sciatica of right side 06/30/2011  . DDD (degenerative disc disease), lumbar 06/30/2011  . HYPERTENSION 06/19/2009  . GERD 06/19/2009  . CONSTIPATION 06/19/2009    Teena Irani, PTA/CLT [phone] 05/29/2014, 3:21 PM  [facility] [address] [place], [place], [address] Phone: [phone]   Fax:  [phone]

## 2014-06-05 ENCOUNTER — Ambulatory Visit (HOSPITAL_COMMUNITY)
Admission: RE | Admit: 2014-06-05 | Discharge: 2014-06-05 | Disposition: A | Discharge status: Home / Self Care | Payer: Medicare Other | Source: Ambulatory Visit | Attending: Internal Medicine | Admitting: Internal Medicine

## 2014-06-05 DIAGNOSIS — M25661 Stiffness of right knee, not elsewhere classified: Secondary | ICD-10-CM | POA: Diagnosis not present

## 2014-06-05 DIAGNOSIS — M79661 Pain in right lower leg: Secondary | ICD-10-CM | POA: Diagnosis not present

## 2014-06-05 DIAGNOSIS — Z5189 Encounter for other specified aftercare: Secondary | ICD-10-CM | POA: Diagnosis not present

## 2014-06-05 DIAGNOSIS — IMO0001 Reserved for inherently not codable concepts without codable children: Secondary | ICD-10-CM

## 2014-06-05 DIAGNOSIS — M546 Pain in thoracic spine: Secondary | ICD-10-CM | POA: Diagnosis not present

## 2014-06-05 DIAGNOSIS — M545 Low back pain: Secondary | ICD-10-CM | POA: Diagnosis not present

## 2014-06-05 DIAGNOSIS — R29898 Other symptoms and signs involving the musculoskeletal system: Secondary | ICD-10-CM

## 2014-06-05 NOTE — Therapy (Signed)
[place], [place], [address] Phone: [phone]   Fax:  [phone]  Physical Therapy Treatment  Patient Details  Name: Melissa Rosales MRN: [id number] Date of Birth: [birth year] Referring Provider:  Redmond School, MD  Encounter Date: 06/05/2014      PT End of Session - 06/05/14 1513    Visit Number 12   Number of Visits [place]; [place] taken 12/16 (on visit #8) at 60% (was 60%)   Authorization - Visit Number 12   Authorization - Number of Visits 20   PT Start Time 1420   PT Stop Time 1505   PT Time Calculation (min) 45 min   Equipment Utilized During Treatment Gait belt   Activity Tolerance Patient tolerated treatment well   Behavior During Therapy [facility] for tasks assessed/performed      Past Medical History  Diagnosis Date  . Hypertension   . Arthritis   . Acid reflux     Past Surgical History  Procedure Laterality Date  . Vaginal hysterectomy    . Vision problems since birth      There were no vitals taken for this visit.  Visit Diagnosis:  Radicular pain of right lower back  Leg weakness, bilateral      Subjective Assessment - 06/05/14 1518    Symptoms Pt states she is not hurting presently.  Reports compliance with HEP.   Currently in Pain? No/denies                    [facility] Adult PT Treatment/Exercise - 06/05/14 1431    Exercises   Exercises Lumbar   Lumbar Exercises: Stretches   ITB Stretch Limitations gastroc stretch 30 sec x 3   Lumbar Exercises: Aerobic   Stationary Bike nustep L5; hills 3 x 10:00   Lumbar Exercises: Standing   Heel Raises 10 reps   Heel Raises Limitations combined with squats lifting  red ball   Forward Lunge 15 reps   Forward Lunge Limitations onto 4" step   Shoulder Adduction Limitations tandem gait 2RT, retro gait 2RT, side stepping with blue theraband 2RT   Other Standing Lumbar Exercises stairs 4" reciprocal 1 HR    Other Standing Lumbar Exercises 3-D hip excursion x 3; steps 3 RT 4" side    Lumbar Exercises: Seated   Other Seated Lumbar Exercises w back with 2 # x 10                  PT Short Term Goals - 05/07/14 1504    PT SHORT TERM GOAL #1   Title I in HEP   Time 1   Period Weeks   Status Achieved   PT SHORT TERM GOAL #2   Title Pt radicular sx no further down her leg than knee level   Time 2   Period Weeks   Status Partially Met   PT SHORT TERM GOAL #3   Title Pt to be able to be up for 30 minutes before radicular sx occur.    Time 2   Status Not Met           PT Long Term Goals - 05/07/14 1505    PT LONG TERM GOAL #1   Title I in advance HEP   Time 4   Period Weeks   Status Achieved   PT LONG TERM GOAL #2   Title Pt to state she is able to be  up for 30 minutes without radicular sx    Period Weeks   Status Not Met   PT LONG TERM GOAL #3   Title Back pain no greater than a 4/10 80 % of the time   Time 4   Period Weeks   Status On-going   PT LONG TERM GOAL #4   Title Pt to be able to go up and down steps in reciprocal manner    Time 4   Period Weeks   Status Achieved   PT LONG TERM GOAL #5   Title LE strength improved one grade to allow the above to occur   Time 4   Period Weeks   Status Partially Met   Additional Long Term Goals   Additional Long Term Goals Yes   PT LONG TERM GOAL #6   Title Pt SLS to be 5-10 seconds B to decrease risk of falling   Time 4  not met                Plan - 06/05/14 1513    Clinical Impression Statement Progressed with balance activities including retro ambulation and side stepping with blue theraband.  Pt required verbal cues to make larger steps backward and keep feet in neutral with side stepping.  Pt able to negotiate 4" staircase reciprocally with 1 HR, however noted diffiuculty with lack of eccentric control descending with Rt LE.  Pt also had difficulty balancing With Rt lunges without use of UE's.     PT  Next Visit Plan Continue to progress stability and dynamic balance.  Advance balance actvities as able.         Problem List Patient Active Problem List   Diagnosis Date Noted  . Pain in joint, lower leg 07/05/2011  . Muscle weakness (generalized) 07/05/2011  . Sciatica of right side 06/30/2011  . DDD (degenerative disc disease), lumbar 06/30/2011  . HYPERTENSION 06/19/2009  . GERD 06/19/2009  . CONSTIPATION 06/19/2009    Teena Irani, PTA/CLT [phone] 06/05/2014, 3:19 PM  [facility] [address] [place], [place], [address] Phone: [phone]   Fax:  [phone]

## 2014-06-10 ENCOUNTER — Ambulatory Visit (HOSPITAL_COMMUNITY)
Admission: RE | Admit: 2014-06-10 | Discharge: 2014-06-10 | Disposition: A | Discharge status: Home / Self Care | Payer: Medicare Other | Source: Ambulatory Visit | Attending: Internal Medicine | Admitting: Internal Medicine

## 2014-06-10 ENCOUNTER — Encounter (HOSPITAL_COMMUNITY): Payer: PRIVATE HEALTH INSURANCE | Admitting: Physical Therapy

## 2014-06-10 DIAGNOSIS — IMO0001 Reserved for inherently not codable concepts without codable children: Secondary | ICD-10-CM

## 2014-06-10 DIAGNOSIS — M25661 Stiffness of right knee, not elsewhere classified: Secondary | ICD-10-CM | POA: Diagnosis not present

## 2014-06-10 DIAGNOSIS — Z5189 Encounter for other specified aftercare: Secondary | ICD-10-CM | POA: Diagnosis not present

## 2014-06-10 DIAGNOSIS — M79661 Pain in right lower leg: Secondary | ICD-10-CM | POA: Diagnosis not present

## 2014-06-10 DIAGNOSIS — M546 Pain in thoracic spine: Secondary | ICD-10-CM | POA: Diagnosis not present

## 2014-06-10 DIAGNOSIS — R29898 Other symptoms and signs involving the musculoskeletal system: Secondary | ICD-10-CM

## 2014-06-10 DIAGNOSIS — M545 Low back pain: Secondary | ICD-10-CM | POA: Diagnosis not present

## 2014-06-10 NOTE — Therapy (Signed)
Melissa Rosales, [place], [address] Phone: [phone]   Fax:  [phone]  Physical Therapy Treatment  Patient Details  Name: Melissa Rosales MRN: [id number] Date of Birth: 1946/01/19 Referring Provider:  Redmond School, MD  Encounter Date: 06/10/2014      PT End of Session - 06/10/14 1430    Visit Number 13   Number of Visits 16   Date for PT Re-Evaluation 06/06/14   Authorization Type [employer] medicare; [place] taken 12/16 (on visit #8) at 60% (was 60%)   Authorization - Visit Number 13   Authorization - Number of Visits 20   PT Start Time 1348   PT Stop Time 1438   PT Time Calculation (min) 50 min   Equipment Utilized During Treatment Gait belt   Activity Tolerance Patient tolerated treatment well   Behavior During Therapy [facility] for tasks assessed/performed      Past Medical History  Diagnosis Date  . Hypertension   . Arthritis   . Acid reflux     Past Surgical History  Procedure Laterality Date  . Vaginal hysterectomy    . Vision problems since birth      There were no vitals taken for this visit.  Visit Diagnosis:  Radicular pain of right lower back  Leg weakness, bilateral      Subjective Assessment - 06/10/14 1351    Symptoms Pt stated she is feeling better, reports radiculart symptoms down Rt lateral LE.  Pt stated she continues to have difficulty with stairs   Currently in Pain? Yes   Pain Score 4    Pain Location Leg   Pain Orientation Right   Pain Descriptors / Indicators Tingling                    [facility] Adult PT Treatment/Exercise - 06/10/14 1354    Exercises   Exercises Lumbar   Lumbar Exercises: Stretches   Active Hamstring Stretch 3 reps;30 seconds   Active Hamstring Stretch Limitations 14in box 3 directinos   Quad Stretch 3 reps;30 seconds   Quad Stretch Limitations slant board   ITB Stretch 2 reps;20 seconds   ITB Stretch Limitations beside 8in step   Piriformis  Stretch 3 reps;30 seconds   Piriformis Stretch Limitations seated   Lumbar Exercises: Aerobic   Stationary Bike nustep L5; hills 3 x 10:00   Lumbar Exercises: Standing   Heel Raises 10 reps   Heel Raises Limitations combined with squats lifting  red ball   Shoulder Adduction Limitations tandem, retro and sidestepping 2RT; SLS Rt 2", Lt 3"   Other Standing Lumbar Exercises stairs ascend 7in, descend 4in 1RT 1HR   Lumbar Exercises: Seated   Other Seated Lumbar Exercises 5 STS                  PT Short Term Goals - 06/10/14 1356    PT SHORT TERM GOAL #1   Title I in HEP   Status Achieved   PT SHORT TERM GOAL #2   Title Pt radicular sx no further down her leg than knee level   Status On-going   PT SHORT TERM GOAL #3   Title Pt to be able to be up for 30 minutes before radicular sx occur.    Status On-going           PT Long Term Goals - 06/10/14 1357    PT LONG TERM GOAL #1   Title I in  advance HEP   PT LONG TERM GOAL #2   Title Pt to state she is able to be up for 30 minutes without radicular sx    PT LONG TERM GOAL #3   Title Back pain no greater than a 4/10 80 % of the time   Status On-going   PT LONG TERM GOAL #4   Title Pt to be able to go up and down steps in reciprocal manner    Status Achieved   PT LONG TERM GOAL #5   Title LE strength improved one grade to allow the above to occur   Status On-going   PT LONG TERM GOAL #6   Title Pt SLS to be 5-10 seconds B to decrease risk of falling               Plan - 06/10/14 1444    Clinical Impression Statement Added piriformis and ITB stretches to POC to reduce radicular symptoms.  Progressed balance activities with cueing for proper foot alignment with retro and sidesteping, min assistance with LOB episodes and cueing to improve spatial awareness.  Began SLS with 3" max of 5, cueing to improve spatial awareness.  Resumed stair training following reports of increased difficulty, pt able to complete  reciprocal pattern with 1 HR, noted weak eccentric control while descending due to weakness.     PT Next Visit Plan Continue to progress stability and dynamic balance.  Advance balance actvities as able.         Problem List Patient Active Problem List   Diagnosis Date Noted  . Pain in joint, lower leg 07/05/2011  . Muscle weakness (generalized) 07/05/2011  . Sciatica of right side 06/30/2011  . DDD (degenerative disc disease), lumbar 06/30/2011  . HYPERTENSION 06/19/2009  . GERD 06/19/2009  . CONSTIPATION 06/19/2009   Melissa Rosales, [place]  Melissa Rosales 06/10/2014, 2:59 PM  [facility] [address] [place], [place], [address] Phone: [phone]   Fax:  [phone]

## 2014-06-12 ENCOUNTER — Ambulatory Visit (HOSPITAL_COMMUNITY)
Admission: RE | Admit: 2014-06-12 | Discharge: 2014-06-12 | Disposition: A | Discharge status: Home / Self Care | Payer: Medicare Other | Source: Ambulatory Visit | Attending: Internal Medicine | Admitting: Internal Medicine

## 2014-06-12 DIAGNOSIS — M545 Low back pain: Secondary | ICD-10-CM | POA: Diagnosis not present

## 2014-06-12 DIAGNOSIS — M546 Pain in thoracic spine: Secondary | ICD-10-CM | POA: Diagnosis not present

## 2014-06-12 DIAGNOSIS — IMO0001 Reserved for inherently not codable concepts without codable children: Secondary | ICD-10-CM

## 2014-06-12 DIAGNOSIS — Z5189 Encounter for other specified aftercare: Secondary | ICD-10-CM | POA: Diagnosis not present

## 2014-06-12 DIAGNOSIS — M25661 Stiffness of right knee, not elsewhere classified: Secondary | ICD-10-CM | POA: Diagnosis not present

## 2014-06-12 DIAGNOSIS — R29898 Other symptoms and signs involving the musculoskeletal system: Secondary | ICD-10-CM

## 2014-06-12 DIAGNOSIS — M79661 Pain in right lower leg: Secondary | ICD-10-CM | POA: Diagnosis not present

## 2014-06-12 NOTE — Therapy (Signed)
[place], [place], [address] Phone: [phone]   Fax:  [phone]  Physical Therapy Treatment  Patient Details  Name: Melissa Rosales MRN: [id number] Date of Birth: [birth year] Referring Provider:  Redmond School, MD  Encounter Date: 06/12/2014      PT End of Session - 06/12/14 1525    Visit Number 14   Number of Visits 16   PT Start Time 6160   PT Stop Time 1520   PT Time Calculation (min) 44 min   Activity Tolerance Patient tolerated treatment well   Behavior During Therapy [facility] for tasks assessed/performed      Past Medical History  Diagnosis Date  . Hypertension   . Arthritis   . Acid reflux     Past Surgical History  Procedure Laterality Date  . Vaginal hysterectomy    . Vision problems since birth      There were no vitals taken for this visit.  Visit Diagnosis:  Radicular pain of right lower back  Leg weakness, bilateral      Subjective Assessment - 06/12/14 1444    Symptoms Pt reports mild radicular symptoms down the Rt leg, though more pain in the Lt knee secondary to arthritic pain from the cold weather.    How long can you stand comfortably? Able to stand for 20 minutes (was 10 minutes).          [facility] PT Assessment - 06/12/14 0001    Assessment   Medical Diagnosis radicular low back pain   Observation/Other Assessments   Focus on Therapeutic Outcomes (FOTO)  FOTO limitation 37% (was 40%)   AROM   Lumbar Flexion WFL   Lumbar Extension WFL   Lumbar - Right Side Bend WFL   Lumbar - Left Side Bend [facility]   Strength   Right Hip Flexion 4-/5  was 3+/5   Right Hip Extension 4-/5  was 3+/5   Right Hip ABduction 4-/5  was 3+/5   Left Hip Flexion 4-/5  was 3+/5   Left Hip Extension 4-/5  was 3+/5   Left Hip ABduction 4-/5  was 3+/5   Right Knee Flexion 5/5  was 4+/5   Right Knee Extension 5/5  was 5/5   Left Knee Flexion 5/5  was 4/5   Left Knee Extension 5/5  was 5/5    Right Ankle Dorsiflexion 4+/5  was 3+/t   Left Ankle Dorsiflexion 5/5  was 5/5                  [facility] Adult PT Treatment/Exercise - 06/12/14 1438    Exercises   Exercises Lumbar   Lumbar Exercises: Stretches   Active Hamstring Stretch 2 reps;30 seconds   Active Hamstring Stretch Limitations 14" Box   Quad Stretch 2 reps;30 seconds   Quad Stretch Limitations slant board   ITB Stretch 2 reps;30 seconds   ITB Stretch Limitations beside 8in step   Piriformis Stretch 2 reps;30 seconds   Piriformis Stretch Limitations Seated   Lumbar Exercises: Aerobic   Stationary Bike nustep L5; hills 3 x 10:00   Lumbar Exercises: Standing   Other Standing Lumbar Exercises stairs ascend 7in, descend 4in 1RT 1HR   Other Standing Lumbar Exercises SLS Rt 3", Lt 2"    Lumbar Exercises: Seated   Sit to Stand Limitations 2x5 with (B) UE   Other Seated Lumbar Exercises --  PT Short Term Goals - 06/12/14 1500    PT SHORT TERM GOAL #1   Title I in HEP   Status Achieved   PT SHORT TERM GOAL #2   Title Pt radicular sx no further down her leg than knee level   Baseline 06/12/14 : To knee level mainly, though does go to lateral calf 3-4 days a week    Status Partially Met   PT SHORT TERM GOAL #3   Title Pt to be able to be up for 30 minutes before radicular sx occur.    Baseline 06/12/14 : Able to amb 20 minutes   Status Partially Met           PT Long Term Goals - 06/12/14 1523    PT LONG TERM GOAL #1   Title I in advance HEP   Status Achieved   PT LONG TERM GOAL #2   Title Pt to state she is able to be up for 30 minutes without radicular sx    Baseline 06/12/14 : Able to stand/amb for 20 minutes    Status Partially Met   PT LONG TERM GOAL #3   Title Back pain no greater than a 4/10 80 % of the time   Status Achieved   PT LONG TERM GOAL #4   Title Pt to be able to go up and down steps in reciprocal manner    Baseline 06/12/14 : Able to ascend 4" stairs  with good reciprocal pattern, and "pulling" up reciprocally on 7" stairs; noted decreased eccentric control with 7" > 4" stairs to descend with reciprocal pattern   Status Achieved   PT LONG TERM GOAL #5   Title LE strength improved one grade to allow the above to occur   Status Achieved   PT LONG TERM GOAL #6   Title Pt SLS to be 5-10 seconds B to decrease risk of falling   Time --  Rt 3", Lt 2" - partly met               Plan - 06/12/14 1524    Clinical Impression Statement Treatment session completed, with updated measurements for discharge.  Pt has currently met 1/3 STG, and 4/6 LTG.  Pt continues to report radicular pain down Rt leg, though mainly only to knee with radicular symptoms to calf a couple days a week.  Pt demonstrates improved functional mobility skills, able to ascend 4" stairs with good technique and descend with only 1 HHA though some difficulty with eccentric control.   Pt agreeable to d/c, and verbalizes importance of continued HEP.    Pt will benefit from skilled therapeutic intervention in order to improve on the following deficits Decreased balance;Decreased activity tolerance;Decreased strength;Pain   PT Next Visit Plan D/C to (I) HEP.         Problem List Patient Active Problem List   Diagnosis Date Noted  . Pain in joint, lower leg 07/05/2011  . Muscle weakness (generalized) 07/05/2011  . Sciatica of right side 06/30/2011  . DDD (degenerative disc disease), lumbar 06/30/2011  . HYPERTENSION 06/19/2009  . GERD 06/19/2009  . CONSTIPATION 06/19/2009    Stephanie Woodworth, DPT [phone]  06/12/2014, 3:26 PM  [facility] [address] [place], [place], [address] Phone: [phone]   Fax:  [phone]    PHYSICAL THERAPY DISCHARGE SUMMARY  Visits from Start of Care: 14  Current functional level related to goals / functional outcomes: See above   Remaining   deficits: See above   Education /  Equipment: HEP provided  Plan: Patient agrees to discharge.  Patient goals were not met. Patient is being discharged due to meeting the stated rehab goals.  ?????       

## 2014-06-17 ENCOUNTER — Ambulatory Visit (HOSPITAL_COMMUNITY): Payer: Medicare Other | Admitting: Physical Therapy

## 2014-06-27 DIAGNOSIS — L84 Corns and callosities: Secondary | ICD-10-CM | POA: Diagnosis not present

## 2014-06-27 DIAGNOSIS — L602 Onychogryphosis: Secondary | ICD-10-CM | POA: Diagnosis not present

## 2014-07-01 DIAGNOSIS — E6609 Other obesity due to excess calories: Secondary | ICD-10-CM | POA: Diagnosis not present

## 2014-07-01 DIAGNOSIS — M179 Osteoarthritis of knee, unspecified: Secondary | ICD-10-CM | POA: Diagnosis not present

## 2014-07-01 DIAGNOSIS — Z6836 Body mass index (BMI) 36.0-36.9, adult: Secondary | ICD-10-CM | POA: Diagnosis not present

## 2014-07-03 ENCOUNTER — Ambulatory Visit (INDEPENDENT_AMBULATORY_CARE_PROVIDER_SITE_OTHER): Payer: Medicare Other | Admitting: Otolaryngology

## 2014-07-24 ENCOUNTER — Ambulatory Visit (INDEPENDENT_AMBULATORY_CARE_PROVIDER_SITE_OTHER): Payer: Medicare Other | Admitting: Otolaryngology

## 2014-07-24 DIAGNOSIS — H903 Sensorineural hearing loss, bilateral: Secondary | ICD-10-CM

## 2014-08-26 ENCOUNTER — Other Ambulatory Visit (HOSPITAL_COMMUNITY): Payer: Self-pay | Admitting: Internal Medicine

## 2014-08-26 DIAGNOSIS — Z09 Encounter for follow-up examination after completed treatment for conditions other than malignant neoplasm: Secondary | ICD-10-CM

## 2014-09-02 DIAGNOSIS — L602 Onychogryphosis: Secondary | ICD-10-CM | POA: Diagnosis not present

## 2014-09-02 DIAGNOSIS — L84 Corns and callosities: Secondary | ICD-10-CM | POA: Diagnosis not present

## 2014-10-07 ENCOUNTER — Ambulatory Visit (HOSPITAL_COMMUNITY)
Admission: RE | Admit: 2014-10-07 | Discharge: 2014-10-07 | Disposition: A | Discharge status: Home / Self Care | Payer: Medicare Other | Source: Ambulatory Visit | Attending: Internal Medicine | Admitting: Internal Medicine

## 2014-10-07 DIAGNOSIS — R921 Mammographic calcification found on diagnostic imaging of breast: Secondary | ICD-10-CM | POA: Diagnosis not present

## 2014-10-07 DIAGNOSIS — R928 Other abnormal and inconclusive findings on diagnostic imaging of breast: Secondary | ICD-10-CM | POA: Diagnosis not present

## 2014-10-07 DIAGNOSIS — Z09 Encounter for follow-up examination after completed treatment for conditions other than malignant neoplasm: Secondary | ICD-10-CM

## 2014-10-08 ENCOUNTER — Other Ambulatory Visit (HOSPITAL_COMMUNITY): Payer: Self-pay | Admitting: Family Medicine

## 2014-10-08 DIAGNOSIS — Z6838 Body mass index (BMI) 38.0-38.9, adult: Secondary | ICD-10-CM | POA: Diagnosis not present

## 2014-10-08 DIAGNOSIS — E782 Mixed hyperlipidemia: Secondary | ICD-10-CM | POA: Diagnosis not present

## 2014-10-08 DIAGNOSIS — Z79899 Other long term (current) drug therapy: Secondary | ICD-10-CM | POA: Diagnosis not present

## 2014-10-08 DIAGNOSIS — Z1389 Encounter for screening for other disorder: Secondary | ICD-10-CM | POA: Diagnosis not present

## 2014-10-08 DIAGNOSIS — Z0001 Encounter for general adult medical examination with abnormal findings: Secondary | ICD-10-CM | POA: Diagnosis not present

## 2014-10-08 DIAGNOSIS — I10 Essential (primary) hypertension: Secondary | ICD-10-CM | POA: Diagnosis not present

## 2014-10-10 ENCOUNTER — Other Ambulatory Visit (HOSPITAL_COMMUNITY): Payer: Self-pay | Admitting: Family Medicine

## 2014-10-10 DIAGNOSIS — M8580 Other specified disorders of bone density and structure, unspecified site: Secondary | ICD-10-CM

## 2014-10-15 ENCOUNTER — Ambulatory Visit (HOSPITAL_COMMUNITY)
Admission: RE | Admit: 2014-10-15 | Discharge: 2014-10-15 | Disposition: A | Discharge status: Home / Self Care | Payer: Medicare Other | Source: Ambulatory Visit | Attending: Family Medicine | Admitting: Family Medicine

## 2014-10-15 DIAGNOSIS — M8580 Other specified disorders of bone density and structure, unspecified site: Secondary | ICD-10-CM

## 2014-10-15 DIAGNOSIS — Z13820 Encounter for screening for osteoporosis: Secondary | ICD-10-CM | POA: Diagnosis not present

## 2014-10-15 DIAGNOSIS — Z780 Asymptomatic menopausal state: Secondary | ICD-10-CM | POA: Diagnosis not present

## 2014-10-15 DIAGNOSIS — M8588 Other specified disorders of bone density and structure, other site: Secondary | ICD-10-CM | POA: Insufficient documentation

## 2014-11-04 DIAGNOSIS — L84 Corns and callosities: Secondary | ICD-10-CM | POA: Diagnosis not present

## 2014-11-04 DIAGNOSIS — L602 Onychogryphosis: Secondary | ICD-10-CM | POA: Diagnosis not present

## 2014-11-20 DIAGNOSIS — R600 Localized edema: Secondary | ICD-10-CM | POA: Diagnosis not present

## 2014-11-20 DIAGNOSIS — I10 Essential (primary) hypertension: Secondary | ICD-10-CM | POA: Diagnosis not present

## 2014-11-20 DIAGNOSIS — Z1389 Encounter for screening for other disorder: Secondary | ICD-10-CM | POA: Diagnosis not present

## 2014-11-20 DIAGNOSIS — E782 Mixed hyperlipidemia: Secondary | ICD-10-CM | POA: Diagnosis not present

## 2014-11-20 DIAGNOSIS — K219 Gastro-esophageal reflux disease without esophagitis: Secondary | ICD-10-CM | POA: Diagnosis not present

## 2014-11-20 DIAGNOSIS — Z6837 Body mass index (BMI) 37.0-37.9, adult: Secondary | ICD-10-CM | POA: Diagnosis not present

## 2015-02-02 DIAGNOSIS — R600 Localized edema: Secondary | ICD-10-CM | POA: Diagnosis not present

## 2015-02-02 DIAGNOSIS — M170 Bilateral primary osteoarthritis of knee: Secondary | ICD-10-CM | POA: Diagnosis not present

## 2015-02-02 DIAGNOSIS — Z6837 Body mass index (BMI) 37.0-37.9, adult: Secondary | ICD-10-CM | POA: Diagnosis not present

## 2015-02-04 DIAGNOSIS — L84 Corns and callosities: Secondary | ICD-10-CM | POA: Diagnosis not present

## 2015-02-04 DIAGNOSIS — L602 Onychogryphosis: Secondary | ICD-10-CM | POA: Diagnosis not present

## 2015-03-03 DIAGNOSIS — R2689 Other abnormalities of gait and mobility: Secondary | ICD-10-CM | POA: Diagnosis not present

## 2015-03-03 DIAGNOSIS — R262 Difficulty in walking, not elsewhere classified: Secondary | ICD-10-CM | POA: Diagnosis not present

## 2015-03-03 DIAGNOSIS — M25461 Effusion, right knee: Secondary | ICD-10-CM | POA: Diagnosis not present

## 2015-03-03 DIAGNOSIS — M25561 Pain in right knee: Secondary | ICD-10-CM | POA: Diagnosis not present

## 2015-03-10 DIAGNOSIS — R262 Difficulty in walking, not elsewhere classified: Secondary | ICD-10-CM | POA: Diagnosis not present

## 2015-03-10 DIAGNOSIS — M25461 Effusion, right knee: Secondary | ICD-10-CM | POA: Diagnosis not present

## 2015-03-10 DIAGNOSIS — M25561 Pain in right knee: Secondary | ICD-10-CM | POA: Diagnosis not present

## 2015-03-10 DIAGNOSIS — R2689 Other abnormalities of gait and mobility: Secondary | ICD-10-CM | POA: Diagnosis not present

## 2015-03-19 DIAGNOSIS — R262 Difficulty in walking, not elsewhere classified: Secondary | ICD-10-CM | POA: Diagnosis not present

## 2015-03-19 DIAGNOSIS — R2689 Other abnormalities of gait and mobility: Secondary | ICD-10-CM | POA: Diagnosis not present

## 2015-03-19 DIAGNOSIS — M25561 Pain in right knee: Secondary | ICD-10-CM | POA: Diagnosis not present

## 2015-03-19 DIAGNOSIS — M25461 Effusion, right knee: Secondary | ICD-10-CM | POA: Diagnosis not present

## 2015-03-24 DIAGNOSIS — R262 Difficulty in walking, not elsewhere classified: Secondary | ICD-10-CM | POA: Diagnosis not present

## 2015-03-24 DIAGNOSIS — M25461 Effusion, right knee: Secondary | ICD-10-CM | POA: Diagnosis not present

## 2015-03-24 DIAGNOSIS — R2689 Other abnormalities of gait and mobility: Secondary | ICD-10-CM | POA: Diagnosis not present

## 2015-03-24 DIAGNOSIS — M25561 Pain in right knee: Secondary | ICD-10-CM | POA: Diagnosis not present

## 2015-03-25 DIAGNOSIS — M25461 Effusion, right knee: Secondary | ICD-10-CM | POA: Diagnosis not present

## 2015-03-25 DIAGNOSIS — R262 Difficulty in walking, not elsewhere classified: Secondary | ICD-10-CM | POA: Diagnosis not present

## 2015-03-25 DIAGNOSIS — R2689 Other abnormalities of gait and mobility: Secondary | ICD-10-CM | POA: Diagnosis not present

## 2015-03-25 DIAGNOSIS — M25561 Pain in right knee: Secondary | ICD-10-CM | POA: Diagnosis not present

## 2015-03-30 DIAGNOSIS — M5416 Radiculopathy, lumbar region: Secondary | ICD-10-CM | POA: Diagnosis not present

## 2015-03-30 DIAGNOSIS — R262 Difficulty in walking, not elsewhere classified: Secondary | ICD-10-CM | POA: Diagnosis not present

## 2015-03-30 DIAGNOSIS — R2689 Other abnormalities of gait and mobility: Secondary | ICD-10-CM | POA: Diagnosis not present

## 2015-03-30 DIAGNOSIS — M25561 Pain in right knee: Secondary | ICD-10-CM | POA: Diagnosis not present

## 2015-03-31 DIAGNOSIS — R2689 Other abnormalities of gait and mobility: Secondary | ICD-10-CM | POA: Diagnosis not present

## 2015-03-31 DIAGNOSIS — R262 Difficulty in walking, not elsewhere classified: Secondary | ICD-10-CM | POA: Diagnosis not present

## 2015-03-31 DIAGNOSIS — M5416 Radiculopathy, lumbar region: Secondary | ICD-10-CM | POA: Diagnosis not present

## 2015-03-31 DIAGNOSIS — M25561 Pain in right knee: Secondary | ICD-10-CM | POA: Diagnosis not present

## 2015-04-02 DIAGNOSIS — R262 Difficulty in walking, not elsewhere classified: Secondary | ICD-10-CM | POA: Diagnosis not present

## 2015-04-02 DIAGNOSIS — R2689 Other abnormalities of gait and mobility: Secondary | ICD-10-CM | POA: Diagnosis not present

## 2015-04-02 DIAGNOSIS — M5416 Radiculopathy, lumbar region: Secondary | ICD-10-CM | POA: Diagnosis not present

## 2015-04-02 DIAGNOSIS — M25561 Pain in right knee: Secondary | ICD-10-CM | POA: Diagnosis not present

## 2015-04-07 DIAGNOSIS — G5601 Carpal tunnel syndrome, right upper limb: Secondary | ICD-10-CM | POA: Diagnosis not present

## 2015-04-07 DIAGNOSIS — Z1389 Encounter for screening for other disorder: Secondary | ICD-10-CM | POA: Diagnosis not present

## 2015-04-07 DIAGNOSIS — Z23 Encounter for immunization: Secondary | ICD-10-CM | POA: Diagnosis not present

## 2015-04-07 DIAGNOSIS — M25561 Pain in right knee: Secondary | ICD-10-CM | POA: Diagnosis not present

## 2015-04-07 DIAGNOSIS — R262 Difficulty in walking, not elsewhere classified: Secondary | ICD-10-CM | POA: Diagnosis not present

## 2015-04-07 DIAGNOSIS — Z6838 Body mass index (BMI) 38.0-38.9, adult: Secondary | ICD-10-CM | POA: Diagnosis not present

## 2015-04-07 DIAGNOSIS — R200 Anesthesia of skin: Secondary | ICD-10-CM | POA: Diagnosis not present

## 2015-04-07 DIAGNOSIS — R2689 Other abnormalities of gait and mobility: Secondary | ICD-10-CM | POA: Diagnosis not present

## 2015-04-07 DIAGNOSIS — M5416 Radiculopathy, lumbar region: Secondary | ICD-10-CM | POA: Diagnosis not present

## 2015-04-08 DIAGNOSIS — R262 Difficulty in walking, not elsewhere classified: Secondary | ICD-10-CM | POA: Diagnosis not present

## 2015-04-08 DIAGNOSIS — R2689 Other abnormalities of gait and mobility: Secondary | ICD-10-CM | POA: Diagnosis not present

## 2015-04-08 DIAGNOSIS — M5416 Radiculopathy, lumbar region: Secondary | ICD-10-CM | POA: Diagnosis not present

## 2015-04-08 DIAGNOSIS — M25561 Pain in right knee: Secondary | ICD-10-CM | POA: Diagnosis not present

## 2015-04-09 DIAGNOSIS — M5416 Radiculopathy, lumbar region: Secondary | ICD-10-CM | POA: Diagnosis not present

## 2015-04-09 DIAGNOSIS — R262 Difficulty in walking, not elsewhere classified: Secondary | ICD-10-CM | POA: Diagnosis not present

## 2015-04-09 DIAGNOSIS — M25561 Pain in right knee: Secondary | ICD-10-CM | POA: Diagnosis not present

## 2015-04-09 DIAGNOSIS — R2689 Other abnormalities of gait and mobility: Secondary | ICD-10-CM | POA: Diagnosis not present

## 2015-04-10 DIAGNOSIS — L84 Corns and callosities: Secondary | ICD-10-CM | POA: Diagnosis not present

## 2015-04-10 DIAGNOSIS — L602 Onychogryphosis: Secondary | ICD-10-CM | POA: Diagnosis not present

## 2015-04-13 DIAGNOSIS — R262 Difficulty in walking, not elsewhere classified: Secondary | ICD-10-CM | POA: Diagnosis not present

## 2015-04-13 DIAGNOSIS — R2689 Other abnormalities of gait and mobility: Secondary | ICD-10-CM | POA: Diagnosis not present

## 2015-04-13 DIAGNOSIS — M5416 Radiculopathy, lumbar region: Secondary | ICD-10-CM | POA: Diagnosis not present

## 2015-04-13 DIAGNOSIS — M25561 Pain in right knee: Secondary | ICD-10-CM | POA: Diagnosis not present

## 2015-04-14 DIAGNOSIS — M5416 Radiculopathy, lumbar region: Secondary | ICD-10-CM | POA: Diagnosis not present

## 2015-04-14 DIAGNOSIS — R262 Difficulty in walking, not elsewhere classified: Secondary | ICD-10-CM | POA: Diagnosis not present

## 2015-04-14 DIAGNOSIS — R2689 Other abnormalities of gait and mobility: Secondary | ICD-10-CM | POA: Diagnosis not present

## 2015-04-14 DIAGNOSIS — M25561 Pain in right knee: Secondary | ICD-10-CM | POA: Diagnosis not present

## 2015-04-20 ENCOUNTER — Other Ambulatory Visit (HOSPITAL_COMMUNITY): Payer: Self-pay | Admitting: Internal Medicine

## 2015-04-20 DIAGNOSIS — Z09 Encounter for follow-up examination after completed treatment for conditions other than malignant neoplasm: Secondary | ICD-10-CM

## 2015-04-20 DIAGNOSIS — R921 Mammographic calcification found on diagnostic imaging of breast: Secondary | ICD-10-CM

## 2015-04-21 DIAGNOSIS — M5416 Radiculopathy, lumbar region: Secondary | ICD-10-CM | POA: Diagnosis not present

## 2015-04-21 DIAGNOSIS — M25561 Pain in right knee: Secondary | ICD-10-CM | POA: Diagnosis not present

## 2015-04-21 DIAGNOSIS — R2689 Other abnormalities of gait and mobility: Secondary | ICD-10-CM | POA: Diagnosis not present

## 2015-04-21 DIAGNOSIS — R262 Difficulty in walking, not elsewhere classified: Secondary | ICD-10-CM | POA: Diagnosis not present

## 2015-04-22 DIAGNOSIS — R262 Difficulty in walking, not elsewhere classified: Secondary | ICD-10-CM | POA: Diagnosis not present

## 2015-04-22 DIAGNOSIS — M25561 Pain in right knee: Secondary | ICD-10-CM | POA: Diagnosis not present

## 2015-04-22 DIAGNOSIS — M5416 Radiculopathy, lumbar region: Secondary | ICD-10-CM | POA: Diagnosis not present

## 2015-04-22 DIAGNOSIS — R2689 Other abnormalities of gait and mobility: Secondary | ICD-10-CM | POA: Diagnosis not present

## 2015-04-23 DIAGNOSIS — R262 Difficulty in walking, not elsewhere classified: Secondary | ICD-10-CM | POA: Diagnosis not present

## 2015-04-23 DIAGNOSIS — M25561 Pain in right knee: Secondary | ICD-10-CM | POA: Diagnosis not present

## 2015-04-23 DIAGNOSIS — R2689 Other abnormalities of gait and mobility: Secondary | ICD-10-CM | POA: Diagnosis not present

## 2015-04-23 DIAGNOSIS — M5416 Radiculopathy, lumbar region: Secondary | ICD-10-CM | POA: Diagnosis not present

## 2015-04-28 ENCOUNTER — Ambulatory Visit (HOSPITAL_COMMUNITY)
Admission: RE | Admit: 2015-04-28 | Discharge: 2015-04-28 | Disposition: A | Discharge status: Home / Self Care | Payer: Medicare Other | Source: Ambulatory Visit | Attending: Internal Medicine | Admitting: Internal Medicine

## 2015-04-28 DIAGNOSIS — Z09 Encounter for follow-up examination after completed treatment for conditions other than malignant neoplasm: Secondary | ICD-10-CM | POA: Diagnosis not present

## 2015-04-28 DIAGNOSIS — R921 Mammographic calcification found on diagnostic imaging of breast: Secondary | ICD-10-CM | POA: Diagnosis not present

## 2015-04-29 DIAGNOSIS — R262 Difficulty in walking, not elsewhere classified: Secondary | ICD-10-CM | POA: Diagnosis not present

## 2015-04-29 DIAGNOSIS — M5416 Radiculopathy, lumbar region: Secondary | ICD-10-CM | POA: Diagnosis not present

## 2015-04-29 DIAGNOSIS — M25561 Pain in right knee: Secondary | ICD-10-CM | POA: Diagnosis not present

## 2015-04-29 DIAGNOSIS — R2689 Other abnormalities of gait and mobility: Secondary | ICD-10-CM | POA: Diagnosis not present

## 2015-04-30 DIAGNOSIS — R262 Difficulty in walking, not elsewhere classified: Secondary | ICD-10-CM | POA: Diagnosis not present

## 2015-04-30 DIAGNOSIS — M25561 Pain in right knee: Secondary | ICD-10-CM | POA: Diagnosis not present

## 2015-04-30 DIAGNOSIS — R2689 Other abnormalities of gait and mobility: Secondary | ICD-10-CM | POA: Diagnosis not present

## 2015-04-30 DIAGNOSIS — M5416 Radiculopathy, lumbar region: Secondary | ICD-10-CM | POA: Diagnosis not present

## 2015-05-01 DIAGNOSIS — R2689 Other abnormalities of gait and mobility: Secondary | ICD-10-CM | POA: Diagnosis not present

## 2015-05-01 DIAGNOSIS — R262 Difficulty in walking, not elsewhere classified: Secondary | ICD-10-CM | POA: Diagnosis not present

## 2015-05-01 DIAGNOSIS — M5416 Radiculopathy, lumbar region: Secondary | ICD-10-CM | POA: Diagnosis not present

## 2015-05-01 DIAGNOSIS — M25561 Pain in right knee: Secondary | ICD-10-CM | POA: Diagnosis not present

## 2015-05-05 DIAGNOSIS — R2689 Other abnormalities of gait and mobility: Secondary | ICD-10-CM | POA: Diagnosis not present

## 2015-05-05 DIAGNOSIS — M25561 Pain in right knee: Secondary | ICD-10-CM | POA: Diagnosis not present

## 2015-05-05 DIAGNOSIS — M5416 Radiculopathy, lumbar region: Secondary | ICD-10-CM | POA: Diagnosis not present

## 2015-05-05 DIAGNOSIS — R262 Difficulty in walking, not elsewhere classified: Secondary | ICD-10-CM | POA: Diagnosis not present

## 2015-05-06 DIAGNOSIS — M25561 Pain in right knee: Secondary | ICD-10-CM | POA: Diagnosis not present

## 2015-05-06 DIAGNOSIS — R262 Difficulty in walking, not elsewhere classified: Secondary | ICD-10-CM | POA: Diagnosis not present

## 2015-05-06 DIAGNOSIS — M5416 Radiculopathy, lumbar region: Secondary | ICD-10-CM | POA: Diagnosis not present

## 2015-05-06 DIAGNOSIS — R2689 Other abnormalities of gait and mobility: Secondary | ICD-10-CM | POA: Diagnosis not present

## 2015-05-07 DIAGNOSIS — R2689 Other abnormalities of gait and mobility: Secondary | ICD-10-CM | POA: Diagnosis not present

## 2015-05-07 DIAGNOSIS — R262 Difficulty in walking, not elsewhere classified: Secondary | ICD-10-CM | POA: Diagnosis not present

## 2015-05-07 DIAGNOSIS — M25561 Pain in right knee: Secondary | ICD-10-CM | POA: Diagnosis not present

## 2015-05-07 DIAGNOSIS — M5416 Radiculopathy, lumbar region: Secondary | ICD-10-CM | POA: Diagnosis not present

## 2015-05-11 DIAGNOSIS — R262 Difficulty in walking, not elsewhere classified: Secondary | ICD-10-CM | POA: Diagnosis not present

## 2015-05-11 DIAGNOSIS — R2689 Other abnormalities of gait and mobility: Secondary | ICD-10-CM | POA: Diagnosis not present

## 2015-05-11 DIAGNOSIS — M25561 Pain in right knee: Secondary | ICD-10-CM | POA: Diagnosis not present

## 2015-05-11 DIAGNOSIS — M5416 Radiculopathy, lumbar region: Secondary | ICD-10-CM | POA: Diagnosis not present

## 2015-05-12 DIAGNOSIS — M5416 Radiculopathy, lumbar region: Secondary | ICD-10-CM | POA: Diagnosis not present

## 2015-05-12 DIAGNOSIS — M25561 Pain in right knee: Secondary | ICD-10-CM | POA: Diagnosis not present

## 2015-05-12 DIAGNOSIS — R2689 Other abnormalities of gait and mobility: Secondary | ICD-10-CM | POA: Diagnosis not present

## 2015-05-12 DIAGNOSIS — R262 Difficulty in walking, not elsewhere classified: Secondary | ICD-10-CM | POA: Diagnosis not present

## 2015-05-13 DIAGNOSIS — R2689 Other abnormalities of gait and mobility: Secondary | ICD-10-CM | POA: Diagnosis not present

## 2015-05-13 DIAGNOSIS — M25561 Pain in right knee: Secondary | ICD-10-CM | POA: Diagnosis not present

## 2015-05-13 DIAGNOSIS — R262 Difficulty in walking, not elsewhere classified: Secondary | ICD-10-CM | POA: Diagnosis not present

## 2015-05-13 DIAGNOSIS — M5416 Radiculopathy, lumbar region: Secondary | ICD-10-CM | POA: Diagnosis not present

## 2015-05-19 DIAGNOSIS — M25561 Pain in right knee: Secondary | ICD-10-CM | POA: Diagnosis not present

## 2015-05-19 DIAGNOSIS — R262 Difficulty in walking, not elsewhere classified: Secondary | ICD-10-CM | POA: Diagnosis not present

## 2015-05-19 DIAGNOSIS — R2689 Other abnormalities of gait and mobility: Secondary | ICD-10-CM | POA: Diagnosis not present

## 2015-05-19 DIAGNOSIS — M5416 Radiculopathy, lumbar region: Secondary | ICD-10-CM | POA: Diagnosis not present

## 2015-05-20 DIAGNOSIS — R2689 Other abnormalities of gait and mobility: Secondary | ICD-10-CM | POA: Diagnosis not present

## 2015-05-20 DIAGNOSIS — R262 Difficulty in walking, not elsewhere classified: Secondary | ICD-10-CM | POA: Diagnosis not present

## 2015-05-20 DIAGNOSIS — M25561 Pain in right knee: Secondary | ICD-10-CM | POA: Diagnosis not present

## 2015-05-20 DIAGNOSIS — M5416 Radiculopathy, lumbar region: Secondary | ICD-10-CM | POA: Diagnosis not present

## 2015-05-21 DIAGNOSIS — R2689 Other abnormalities of gait and mobility: Secondary | ICD-10-CM | POA: Diagnosis not present

## 2015-05-21 DIAGNOSIS — M5416 Radiculopathy, lumbar region: Secondary | ICD-10-CM | POA: Diagnosis not present

## 2015-05-21 DIAGNOSIS — R262 Difficulty in walking, not elsewhere classified: Secondary | ICD-10-CM | POA: Diagnosis not present

## 2015-05-21 DIAGNOSIS — M25561 Pain in right knee: Secondary | ICD-10-CM | POA: Diagnosis not present

## 2015-05-26 DIAGNOSIS — R2689 Other abnormalities of gait and mobility: Secondary | ICD-10-CM | POA: Diagnosis not present

## 2015-05-26 DIAGNOSIS — G5600 Carpal tunnel syndrome, unspecified upper limb: Secondary | ICD-10-CM | POA: Diagnosis not present

## 2015-05-26 DIAGNOSIS — H5442 Blindness, left eye, normal vision right eye: Secondary | ICD-10-CM | POA: Diagnosis not present

## 2015-05-26 DIAGNOSIS — G4709 Other insomnia: Secondary | ICD-10-CM | POA: Diagnosis not present

## 2015-05-26 DIAGNOSIS — M5416 Radiculopathy, lumbar region: Secondary | ICD-10-CM | POA: Diagnosis not present

## 2015-05-26 DIAGNOSIS — R262 Difficulty in walking, not elsewhere classified: Secondary | ICD-10-CM | POA: Diagnosis not present

## 2015-05-26 DIAGNOSIS — M25561 Pain in right knee: Secondary | ICD-10-CM | POA: Diagnosis not present

## 2015-05-27 DIAGNOSIS — R262 Difficulty in walking, not elsewhere classified: Secondary | ICD-10-CM | POA: Diagnosis not present

## 2015-05-27 DIAGNOSIS — M25561 Pain in right knee: Secondary | ICD-10-CM | POA: Diagnosis not present

## 2015-05-27 DIAGNOSIS — M5416 Radiculopathy, lumbar region: Secondary | ICD-10-CM | POA: Diagnosis not present

## 2015-05-27 DIAGNOSIS — R2689 Other abnormalities of gait and mobility: Secondary | ICD-10-CM | POA: Diagnosis not present

## 2015-05-28 DIAGNOSIS — R2689 Other abnormalities of gait and mobility: Secondary | ICD-10-CM | POA: Diagnosis not present

## 2015-05-28 DIAGNOSIS — M5416 Radiculopathy, lumbar region: Secondary | ICD-10-CM | POA: Diagnosis not present

## 2015-05-28 DIAGNOSIS — R262 Difficulty in walking, not elsewhere classified: Secondary | ICD-10-CM | POA: Diagnosis not present

## 2015-05-28 DIAGNOSIS — M25561 Pain in right knee: Secondary | ICD-10-CM | POA: Diagnosis not present

## 2015-06-04 DIAGNOSIS — M5416 Radiculopathy, lumbar region: Secondary | ICD-10-CM | POA: Diagnosis not present

## 2015-06-04 DIAGNOSIS — R2689 Other abnormalities of gait and mobility: Secondary | ICD-10-CM | POA: Diagnosis not present

## 2015-06-04 DIAGNOSIS — R262 Difficulty in walking, not elsewhere classified: Secondary | ICD-10-CM | POA: Diagnosis not present

## 2015-06-04 DIAGNOSIS — M25561 Pain in right knee: Secondary | ICD-10-CM | POA: Diagnosis not present

## 2015-06-05 DIAGNOSIS — M5416 Radiculopathy, lumbar region: Secondary | ICD-10-CM | POA: Diagnosis not present

## 2015-06-05 DIAGNOSIS — R2689 Other abnormalities of gait and mobility: Secondary | ICD-10-CM | POA: Diagnosis not present

## 2015-06-05 DIAGNOSIS — M25561 Pain in right knee: Secondary | ICD-10-CM | POA: Diagnosis not present

## 2015-06-05 DIAGNOSIS — R262 Difficulty in walking, not elsewhere classified: Secondary | ICD-10-CM | POA: Diagnosis not present

## 2015-06-09 DIAGNOSIS — R2689 Other abnormalities of gait and mobility: Secondary | ICD-10-CM | POA: Diagnosis not present

## 2015-06-09 DIAGNOSIS — M25561 Pain in right knee: Secondary | ICD-10-CM | POA: Diagnosis not present

## 2015-06-09 DIAGNOSIS — R262 Difficulty in walking, not elsewhere classified: Secondary | ICD-10-CM | POA: Diagnosis not present

## 2015-06-09 DIAGNOSIS — M5416 Radiculopathy, lumbar region: Secondary | ICD-10-CM | POA: Diagnosis not present

## 2015-06-10 DIAGNOSIS — M25561 Pain in right knee: Secondary | ICD-10-CM | POA: Diagnosis not present

## 2015-06-10 DIAGNOSIS — M5416 Radiculopathy, lumbar region: Secondary | ICD-10-CM | POA: Diagnosis not present

## 2015-06-10 DIAGNOSIS — R262 Difficulty in walking, not elsewhere classified: Secondary | ICD-10-CM | POA: Diagnosis not present

## 2015-06-10 DIAGNOSIS — R2689 Other abnormalities of gait and mobility: Secondary | ICD-10-CM | POA: Diagnosis not present

## 2015-06-11 DIAGNOSIS — R262 Difficulty in walking, not elsewhere classified: Secondary | ICD-10-CM | POA: Diagnosis not present

## 2015-06-11 DIAGNOSIS — R2689 Other abnormalities of gait and mobility: Secondary | ICD-10-CM | POA: Diagnosis not present

## 2015-06-11 DIAGNOSIS — M5416 Radiculopathy, lumbar region: Secondary | ICD-10-CM | POA: Diagnosis not present

## 2015-06-11 DIAGNOSIS — M25561 Pain in right knee: Secondary | ICD-10-CM | POA: Diagnosis not present

## 2015-06-16 DIAGNOSIS — R262 Difficulty in walking, not elsewhere classified: Secondary | ICD-10-CM | POA: Diagnosis not present

## 2015-06-16 DIAGNOSIS — M5416 Radiculopathy, lumbar region: Secondary | ICD-10-CM | POA: Diagnosis not present

## 2015-06-16 DIAGNOSIS — R2689 Other abnormalities of gait and mobility: Secondary | ICD-10-CM | POA: Diagnosis not present

## 2015-06-16 DIAGNOSIS — M25561 Pain in right knee: Secondary | ICD-10-CM | POA: Diagnosis not present

## 2015-06-17 DIAGNOSIS — R2689 Other abnormalities of gait and mobility: Secondary | ICD-10-CM | POA: Diagnosis not present

## 2015-06-17 DIAGNOSIS — R262 Difficulty in walking, not elsewhere classified: Secondary | ICD-10-CM | POA: Diagnosis not present

## 2015-06-17 DIAGNOSIS — M25561 Pain in right knee: Secondary | ICD-10-CM | POA: Diagnosis not present

## 2015-06-17 DIAGNOSIS — M5416 Radiculopathy, lumbar region: Secondary | ICD-10-CM | POA: Diagnosis not present

## 2015-06-18 DIAGNOSIS — M25561 Pain in right knee: Secondary | ICD-10-CM | POA: Diagnosis not present

## 2015-06-18 DIAGNOSIS — R2689 Other abnormalities of gait and mobility: Secondary | ICD-10-CM | POA: Diagnosis not present

## 2015-06-18 DIAGNOSIS — M5416 Radiculopathy, lumbar region: Secondary | ICD-10-CM | POA: Diagnosis not present

## 2015-06-18 DIAGNOSIS — R262 Difficulty in walking, not elsewhere classified: Secondary | ICD-10-CM | POA: Diagnosis not present

## 2015-06-22 DIAGNOSIS — L84 Corns and callosities: Secondary | ICD-10-CM | POA: Diagnosis not present

## 2015-06-22 DIAGNOSIS — L602 Onychogryphosis: Secondary | ICD-10-CM | POA: Diagnosis not present

## 2015-06-23 DIAGNOSIS — M5416 Radiculopathy, lumbar region: Secondary | ICD-10-CM | POA: Diagnosis not present

## 2015-06-23 DIAGNOSIS — R262 Difficulty in walking, not elsewhere classified: Secondary | ICD-10-CM | POA: Diagnosis not present

## 2015-06-23 DIAGNOSIS — R2689 Other abnormalities of gait and mobility: Secondary | ICD-10-CM | POA: Diagnosis not present

## 2015-06-23 DIAGNOSIS — M25561 Pain in right knee: Secondary | ICD-10-CM | POA: Diagnosis not present

## 2015-06-24 DIAGNOSIS — G603 Idiopathic progressive neuropathy: Secondary | ICD-10-CM | POA: Diagnosis not present

## 2015-06-24 DIAGNOSIS — M5412 Radiculopathy, cervical region: Secondary | ICD-10-CM | POA: Diagnosis not present

## 2015-06-25 DIAGNOSIS — H548 Legal blindness, as defined in USA: Secondary | ICD-10-CM | POA: Diagnosis not present

## 2015-06-25 DIAGNOSIS — G629 Polyneuropathy, unspecified: Secondary | ICD-10-CM | POA: Diagnosis not present

## 2015-06-25 DIAGNOSIS — K219 Gastro-esophageal reflux disease without esophagitis: Secondary | ICD-10-CM | POA: Diagnosis not present

## 2015-06-25 DIAGNOSIS — I10 Essential (primary) hypertension: Secondary | ICD-10-CM | POA: Diagnosis not present

## 2015-06-25 DIAGNOSIS — R2689 Other abnormalities of gait and mobility: Secondary | ICD-10-CM | POA: Diagnosis not present

## 2015-06-25 DIAGNOSIS — M25561 Pain in right knee: Secondary | ICD-10-CM | POA: Diagnosis not present

## 2015-06-25 DIAGNOSIS — M5416 Radiculopathy, lumbar region: Secondary | ICD-10-CM | POA: Diagnosis not present

## 2015-06-25 DIAGNOSIS — R262 Difficulty in walking, not elsewhere classified: Secondary | ICD-10-CM | POA: Diagnosis not present

## 2015-06-25 DIAGNOSIS — Z1389 Encounter for screening for other disorder: Secondary | ICD-10-CM | POA: Diagnosis not present

## 2015-06-25 DIAGNOSIS — Z79899 Other long term (current) drug therapy: Secondary | ICD-10-CM | POA: Diagnosis not present

## 2015-06-26 DIAGNOSIS — M5416 Radiculopathy, lumbar region: Secondary | ICD-10-CM | POA: Diagnosis not present

## 2015-06-26 DIAGNOSIS — R2689 Other abnormalities of gait and mobility: Secondary | ICD-10-CM | POA: Diagnosis not present

## 2015-06-26 DIAGNOSIS — M25561 Pain in right knee: Secondary | ICD-10-CM | POA: Diagnosis not present

## 2015-06-26 DIAGNOSIS — R262 Difficulty in walking, not elsewhere classified: Secondary | ICD-10-CM | POA: Diagnosis not present

## 2015-07-06 DIAGNOSIS — R262 Difficulty in walking, not elsewhere classified: Secondary | ICD-10-CM | POA: Diagnosis not present

## 2015-07-06 DIAGNOSIS — M25561 Pain in right knee: Secondary | ICD-10-CM | POA: Diagnosis not present

## 2015-07-06 DIAGNOSIS — R2689 Other abnormalities of gait and mobility: Secondary | ICD-10-CM | POA: Diagnosis not present

## 2015-07-06 DIAGNOSIS — M5416 Radiculopathy, lumbar region: Secondary | ICD-10-CM | POA: Diagnosis not present

## 2015-07-09 DIAGNOSIS — R262 Difficulty in walking, not elsewhere classified: Secondary | ICD-10-CM | POA: Diagnosis not present

## 2015-07-09 DIAGNOSIS — M5416 Radiculopathy, lumbar region: Secondary | ICD-10-CM | POA: Diagnosis not present

## 2015-07-09 DIAGNOSIS — R2689 Other abnormalities of gait and mobility: Secondary | ICD-10-CM | POA: Diagnosis not present

## 2015-07-09 DIAGNOSIS — M25561 Pain in right knee: Secondary | ICD-10-CM | POA: Diagnosis not present

## 2015-07-16 DIAGNOSIS — M5431 Sciatica, right side: Secondary | ICD-10-CM | POA: Diagnosis not present

## 2015-07-16 DIAGNOSIS — Z1389 Encounter for screening for other disorder: Secondary | ICD-10-CM | POA: Diagnosis not present

## 2015-07-20 DIAGNOSIS — R262 Difficulty in walking, not elsewhere classified: Secondary | ICD-10-CM | POA: Diagnosis not present

## 2015-07-20 DIAGNOSIS — M25561 Pain in right knee: Secondary | ICD-10-CM | POA: Diagnosis not present

## 2015-07-20 DIAGNOSIS — M5416 Radiculopathy, lumbar region: Secondary | ICD-10-CM | POA: Diagnosis not present

## 2015-07-20 DIAGNOSIS — R2689 Other abnormalities of gait and mobility: Secondary | ICD-10-CM | POA: Diagnosis not present

## 2015-07-22 DIAGNOSIS — R262 Difficulty in walking, not elsewhere classified: Secondary | ICD-10-CM | POA: Diagnosis not present

## 2015-07-22 DIAGNOSIS — M25561 Pain in right knee: Secondary | ICD-10-CM | POA: Diagnosis not present

## 2015-07-22 DIAGNOSIS — H5412 Blindness, left eye, low vision right eye: Secondary | ICD-10-CM | POA: Diagnosis not present

## 2015-07-22 DIAGNOSIS — R2689 Other abnormalities of gait and mobility: Secondary | ICD-10-CM | POA: Diagnosis not present

## 2015-07-22 DIAGNOSIS — R208 Other disturbances of skin sensation: Secondary | ICD-10-CM | POA: Diagnosis not present

## 2015-07-22 DIAGNOSIS — F5101 Primary insomnia: Secondary | ICD-10-CM | POA: Diagnosis not present

## 2015-07-22 DIAGNOSIS — M5416 Radiculopathy, lumbar region: Secondary | ICD-10-CM | POA: Diagnosis not present

## 2015-07-23 ENCOUNTER — Ambulatory Visit (INDEPENDENT_AMBULATORY_CARE_PROVIDER_SITE_OTHER): Payer: Self-pay | Admitting: Otolaryngology

## 2015-07-23 DIAGNOSIS — Z1389 Encounter for screening for other disorder: Secondary | ICD-10-CM | POA: Diagnosis not present

## 2015-07-23 DIAGNOSIS — K5900 Constipation, unspecified: Secondary | ICD-10-CM | POA: Diagnosis not present

## 2015-07-27 DIAGNOSIS — M5416 Radiculopathy, lumbar region: Secondary | ICD-10-CM | POA: Diagnosis not present

## 2015-07-27 DIAGNOSIS — R262 Difficulty in walking, not elsewhere classified: Secondary | ICD-10-CM | POA: Diagnosis not present

## 2015-07-27 DIAGNOSIS — M25561 Pain in right knee: Secondary | ICD-10-CM | POA: Diagnosis not present

## 2015-07-27 DIAGNOSIS — R2689 Other abnormalities of gait and mobility: Secondary | ICD-10-CM | POA: Diagnosis not present

## 2015-07-28 DIAGNOSIS — R262 Difficulty in walking, not elsewhere classified: Secondary | ICD-10-CM | POA: Diagnosis not present

## 2015-07-28 DIAGNOSIS — M5416 Radiculopathy, lumbar region: Secondary | ICD-10-CM | POA: Diagnosis not present

## 2015-07-28 DIAGNOSIS — R2689 Other abnormalities of gait and mobility: Secondary | ICD-10-CM | POA: Diagnosis not present

## 2015-07-28 DIAGNOSIS — M25561 Pain in right knee: Secondary | ICD-10-CM | POA: Diagnosis not present

## 2015-08-03 DIAGNOSIS — R262 Difficulty in walking, not elsewhere classified: Secondary | ICD-10-CM | POA: Diagnosis not present

## 2015-08-03 DIAGNOSIS — R2689 Other abnormalities of gait and mobility: Secondary | ICD-10-CM | POA: Diagnosis not present

## 2015-08-03 DIAGNOSIS — M25561 Pain in right knee: Secondary | ICD-10-CM | POA: Diagnosis not present

## 2015-08-03 DIAGNOSIS — M5416 Radiculopathy, lumbar region: Secondary | ICD-10-CM | POA: Diagnosis not present

## 2015-08-05 DIAGNOSIS — R2689 Other abnormalities of gait and mobility: Secondary | ICD-10-CM | POA: Diagnosis not present

## 2015-08-05 DIAGNOSIS — M25561 Pain in right knee: Secondary | ICD-10-CM | POA: Diagnosis not present

## 2015-08-05 DIAGNOSIS — M5416 Radiculopathy, lumbar region: Secondary | ICD-10-CM | POA: Diagnosis not present

## 2015-08-05 DIAGNOSIS — R262 Difficulty in walking, not elsewhere classified: Secondary | ICD-10-CM | POA: Diagnosis not present

## 2015-08-11 DIAGNOSIS — M5416 Radiculopathy, lumbar region: Secondary | ICD-10-CM | POA: Diagnosis not present

## 2015-08-11 DIAGNOSIS — R2689 Other abnormalities of gait and mobility: Secondary | ICD-10-CM | POA: Diagnosis not present

## 2015-08-11 DIAGNOSIS — M25561 Pain in right knee: Secondary | ICD-10-CM | POA: Diagnosis not present

## 2015-08-11 DIAGNOSIS — R262 Difficulty in walking, not elsewhere classified: Secondary | ICD-10-CM | POA: Diagnosis not present

## 2015-08-13 DIAGNOSIS — R2689 Other abnormalities of gait and mobility: Secondary | ICD-10-CM | POA: Diagnosis not present

## 2015-08-13 DIAGNOSIS — M5416 Radiculopathy, lumbar region: Secondary | ICD-10-CM | POA: Diagnosis not present

## 2015-08-13 DIAGNOSIS — M25561 Pain in right knee: Secondary | ICD-10-CM | POA: Diagnosis not present

## 2015-08-13 DIAGNOSIS — R262 Difficulty in walking, not elsewhere classified: Secondary | ICD-10-CM | POA: Diagnosis not present

## 2015-08-17 DIAGNOSIS — M25561 Pain in right knee: Secondary | ICD-10-CM | POA: Diagnosis not present

## 2015-08-17 DIAGNOSIS — M5416 Radiculopathy, lumbar region: Secondary | ICD-10-CM | POA: Diagnosis not present

## 2015-08-17 DIAGNOSIS — R2689 Other abnormalities of gait and mobility: Secondary | ICD-10-CM | POA: Diagnosis not present

## 2015-08-17 DIAGNOSIS — R262 Difficulty in walking, not elsewhere classified: Secondary | ICD-10-CM | POA: Diagnosis not present

## 2015-08-19 DIAGNOSIS — M5416 Radiculopathy, lumbar region: Secondary | ICD-10-CM | POA: Diagnosis not present

## 2015-08-19 DIAGNOSIS — M25561 Pain in right knee: Secondary | ICD-10-CM | POA: Diagnosis not present

## 2015-08-19 DIAGNOSIS — R262 Difficulty in walking, not elsewhere classified: Secondary | ICD-10-CM | POA: Diagnosis not present

## 2015-08-19 DIAGNOSIS — R2689 Other abnormalities of gait and mobility: Secondary | ICD-10-CM | POA: Diagnosis not present

## 2015-08-25 DIAGNOSIS — R2689 Other abnormalities of gait and mobility: Secondary | ICD-10-CM | POA: Diagnosis not present

## 2015-08-25 DIAGNOSIS — M25561 Pain in right knee: Secondary | ICD-10-CM | POA: Diagnosis not present

## 2015-08-25 DIAGNOSIS — R262 Difficulty in walking, not elsewhere classified: Secondary | ICD-10-CM | POA: Diagnosis not present

## 2015-08-25 DIAGNOSIS — M5416 Radiculopathy, lumbar region: Secondary | ICD-10-CM | POA: Diagnosis not present

## 2015-08-27 DIAGNOSIS — M5416 Radiculopathy, lumbar region: Secondary | ICD-10-CM | POA: Diagnosis not present

## 2015-08-27 DIAGNOSIS — R262 Difficulty in walking, not elsewhere classified: Secondary | ICD-10-CM | POA: Diagnosis not present

## 2015-08-27 DIAGNOSIS — R2689 Other abnormalities of gait and mobility: Secondary | ICD-10-CM | POA: Diagnosis not present

## 2015-08-27 DIAGNOSIS — M25561 Pain in right knee: Secondary | ICD-10-CM | POA: Diagnosis not present

## 2015-09-01 DIAGNOSIS — M25561 Pain in right knee: Secondary | ICD-10-CM | POA: Diagnosis not present

## 2015-09-01 DIAGNOSIS — R262 Difficulty in walking, not elsewhere classified: Secondary | ICD-10-CM | POA: Diagnosis not present

## 2015-09-01 DIAGNOSIS — M5416 Radiculopathy, lumbar region: Secondary | ICD-10-CM | POA: Diagnosis not present

## 2015-09-01 DIAGNOSIS — R2689 Other abnormalities of gait and mobility: Secondary | ICD-10-CM | POA: Diagnosis not present

## 2015-09-03 DIAGNOSIS — R2689 Other abnormalities of gait and mobility: Secondary | ICD-10-CM | POA: Diagnosis not present

## 2015-09-03 DIAGNOSIS — M5416 Radiculopathy, lumbar region: Secondary | ICD-10-CM | POA: Diagnosis not present

## 2015-09-03 DIAGNOSIS — M25561 Pain in right knee: Secondary | ICD-10-CM | POA: Diagnosis not present

## 2015-09-03 DIAGNOSIS — R262 Difficulty in walking, not elsewhere classified: Secondary | ICD-10-CM | POA: Diagnosis not present

## 2015-09-08 DIAGNOSIS — R2689 Other abnormalities of gait and mobility: Secondary | ICD-10-CM | POA: Diagnosis not present

## 2015-09-08 DIAGNOSIS — M5416 Radiculopathy, lumbar region: Secondary | ICD-10-CM | POA: Diagnosis not present

## 2015-09-08 DIAGNOSIS — R262 Difficulty in walking, not elsewhere classified: Secondary | ICD-10-CM | POA: Diagnosis not present

## 2015-09-08 DIAGNOSIS — M25561 Pain in right knee: Secondary | ICD-10-CM | POA: Diagnosis not present

## 2015-09-10 DIAGNOSIS — M5416 Radiculopathy, lumbar region: Secondary | ICD-10-CM | POA: Diagnosis not present

## 2015-09-10 DIAGNOSIS — R2689 Other abnormalities of gait and mobility: Secondary | ICD-10-CM | POA: Diagnosis not present

## 2015-09-10 DIAGNOSIS — M25561 Pain in right knee: Secondary | ICD-10-CM | POA: Diagnosis not present

## 2015-09-10 DIAGNOSIS — R262 Difficulty in walking, not elsewhere classified: Secondary | ICD-10-CM | POA: Diagnosis not present

## 2015-09-14 DIAGNOSIS — R262 Difficulty in walking, not elsewhere classified: Secondary | ICD-10-CM | POA: Diagnosis not present

## 2015-09-14 DIAGNOSIS — M5416 Radiculopathy, lumbar region: Secondary | ICD-10-CM | POA: Diagnosis not present

## 2015-09-14 DIAGNOSIS — R2689 Other abnormalities of gait and mobility: Secondary | ICD-10-CM | POA: Diagnosis not present

## 2015-09-14 DIAGNOSIS — M25561 Pain in right knee: Secondary | ICD-10-CM | POA: Diagnosis not present

## 2015-09-16 DIAGNOSIS — M25561 Pain in right knee: Secondary | ICD-10-CM | POA: Diagnosis not present

## 2015-09-16 DIAGNOSIS — R262 Difficulty in walking, not elsewhere classified: Secondary | ICD-10-CM | POA: Diagnosis not present

## 2015-09-16 DIAGNOSIS — M5416 Radiculopathy, lumbar region: Secondary | ICD-10-CM | POA: Diagnosis not present

## 2015-09-16 DIAGNOSIS — R2689 Other abnormalities of gait and mobility: Secondary | ICD-10-CM | POA: Diagnosis not present

## 2015-09-22 DIAGNOSIS — M25561 Pain in right knee: Secondary | ICD-10-CM | POA: Diagnosis not present

## 2015-09-22 DIAGNOSIS — M5416 Radiculopathy, lumbar region: Secondary | ICD-10-CM | POA: Diagnosis not present

## 2015-09-22 DIAGNOSIS — R2689 Other abnormalities of gait and mobility: Secondary | ICD-10-CM | POA: Diagnosis not present

## 2015-09-22 DIAGNOSIS — R262 Difficulty in walking, not elsewhere classified: Secondary | ICD-10-CM | POA: Diagnosis not present

## 2015-09-24 ENCOUNTER — Ambulatory Visit (INDEPENDENT_AMBULATORY_CARE_PROVIDER_SITE_OTHER): Payer: Medicare Other | Admitting: Otolaryngology

## 2015-09-24 DIAGNOSIS — R262 Difficulty in walking, not elsewhere classified: Secondary | ICD-10-CM | POA: Diagnosis not present

## 2015-09-24 DIAGNOSIS — H9222 Otorrhagia, left ear: Secondary | ICD-10-CM

## 2015-09-24 DIAGNOSIS — H903 Sensorineural hearing loss, bilateral: Secondary | ICD-10-CM

## 2015-09-24 DIAGNOSIS — M25561 Pain in right knee: Secondary | ICD-10-CM | POA: Diagnosis not present

## 2015-09-24 DIAGNOSIS — M5416 Radiculopathy, lumbar region: Secondary | ICD-10-CM | POA: Diagnosis not present

## 2015-09-24 DIAGNOSIS — R2689 Other abnormalities of gait and mobility: Secondary | ICD-10-CM | POA: Diagnosis not present

## 2015-09-25 DIAGNOSIS — L84 Corns and callosities: Secondary | ICD-10-CM | POA: Diagnosis not present

## 2015-09-25 DIAGNOSIS — L602 Onychogryphosis: Secondary | ICD-10-CM | POA: Diagnosis not present

## 2015-09-29 DIAGNOSIS — R262 Difficulty in walking, not elsewhere classified: Secondary | ICD-10-CM | POA: Diagnosis not present

## 2015-09-29 DIAGNOSIS — R2689 Other abnormalities of gait and mobility: Secondary | ICD-10-CM | POA: Diagnosis not present

## 2015-09-29 DIAGNOSIS — M5416 Radiculopathy, lumbar region: Secondary | ICD-10-CM | POA: Diagnosis not present

## 2015-09-29 DIAGNOSIS — M25561 Pain in right knee: Secondary | ICD-10-CM | POA: Diagnosis not present

## 2015-10-01 DIAGNOSIS — M5416 Radiculopathy, lumbar region: Secondary | ICD-10-CM | POA: Diagnosis not present

## 2015-10-01 DIAGNOSIS — M25561 Pain in right knee: Secondary | ICD-10-CM | POA: Diagnosis not present

## 2015-10-01 DIAGNOSIS — R262 Difficulty in walking, not elsewhere classified: Secondary | ICD-10-CM | POA: Diagnosis not present

## 2015-10-01 DIAGNOSIS — R2689 Other abnormalities of gait and mobility: Secondary | ICD-10-CM | POA: Diagnosis not present

## 2015-10-14 DIAGNOSIS — R262 Difficulty in walking, not elsewhere classified: Secondary | ICD-10-CM | POA: Diagnosis not present

## 2015-10-14 DIAGNOSIS — M5416 Radiculopathy, lumbar region: Secondary | ICD-10-CM | POA: Diagnosis not present

## 2015-10-14 DIAGNOSIS — M25561 Pain in right knee: Secondary | ICD-10-CM | POA: Diagnosis not present

## 2015-10-14 DIAGNOSIS — R2689 Other abnormalities of gait and mobility: Secondary | ICD-10-CM | POA: Diagnosis not present

## 2015-10-16 DIAGNOSIS — M5416 Radiculopathy, lumbar region: Secondary | ICD-10-CM | POA: Diagnosis not present

## 2015-10-16 DIAGNOSIS — M25561 Pain in right knee: Secondary | ICD-10-CM | POA: Diagnosis not present

## 2015-10-16 DIAGNOSIS — R2689 Other abnormalities of gait and mobility: Secondary | ICD-10-CM | POA: Diagnosis not present

## 2015-10-16 DIAGNOSIS — R262 Difficulty in walking, not elsewhere classified: Secondary | ICD-10-CM | POA: Diagnosis not present

## 2015-10-27 DIAGNOSIS — R262 Difficulty in walking, not elsewhere classified: Secondary | ICD-10-CM | POA: Diagnosis not present

## 2015-10-27 DIAGNOSIS — R2689 Other abnormalities of gait and mobility: Secondary | ICD-10-CM | POA: Diagnosis not present

## 2015-10-27 DIAGNOSIS — M25561 Pain in right knee: Secondary | ICD-10-CM | POA: Diagnosis not present

## 2015-10-27 DIAGNOSIS — M5416 Radiculopathy, lumbar region: Secondary | ICD-10-CM | POA: Diagnosis not present

## 2015-10-29 DIAGNOSIS — R262 Difficulty in walking, not elsewhere classified: Secondary | ICD-10-CM | POA: Diagnosis not present

## 2015-10-29 DIAGNOSIS — R2689 Other abnormalities of gait and mobility: Secondary | ICD-10-CM | POA: Diagnosis not present

## 2015-10-29 DIAGNOSIS — M5416 Radiculopathy, lumbar region: Secondary | ICD-10-CM | POA: Diagnosis not present

## 2015-10-29 DIAGNOSIS — M25561 Pain in right knee: Secondary | ICD-10-CM | POA: Diagnosis not present

## 2015-11-04 DIAGNOSIS — R2689 Other abnormalities of gait and mobility: Secondary | ICD-10-CM | POA: Diagnosis not present

## 2015-11-04 DIAGNOSIS — M25561 Pain in right knee: Secondary | ICD-10-CM | POA: Diagnosis not present

## 2015-11-04 DIAGNOSIS — R262 Difficulty in walking, not elsewhere classified: Secondary | ICD-10-CM | POA: Diagnosis not present

## 2015-11-04 DIAGNOSIS — M5416 Radiculopathy, lumbar region: Secondary | ICD-10-CM | POA: Diagnosis not present

## 2015-11-06 DIAGNOSIS — R262 Difficulty in walking, not elsewhere classified: Secondary | ICD-10-CM | POA: Diagnosis not present

## 2015-11-06 DIAGNOSIS — M79641 Pain in right hand: Secondary | ICD-10-CM | POA: Diagnosis not present

## 2015-11-06 DIAGNOSIS — M5416 Radiculopathy, lumbar region: Secondary | ICD-10-CM | POA: Diagnosis not present

## 2015-11-06 DIAGNOSIS — R2689 Other abnormalities of gait and mobility: Secondary | ICD-10-CM | POA: Diagnosis not present

## 2015-11-06 DIAGNOSIS — M25561 Pain in right knee: Secondary | ICD-10-CM | POA: Diagnosis not present

## 2015-11-17 DIAGNOSIS — M5416 Radiculopathy, lumbar region: Secondary | ICD-10-CM | POA: Diagnosis not present

## 2015-11-17 DIAGNOSIS — R2689 Other abnormalities of gait and mobility: Secondary | ICD-10-CM | POA: Diagnosis not present

## 2015-11-17 DIAGNOSIS — M25561 Pain in right knee: Secondary | ICD-10-CM | POA: Diagnosis not present

## 2015-11-17 DIAGNOSIS — R262 Difficulty in walking, not elsewhere classified: Secondary | ICD-10-CM | POA: Diagnosis not present

## 2015-11-19 DIAGNOSIS — R262 Difficulty in walking, not elsewhere classified: Secondary | ICD-10-CM | POA: Diagnosis not present

## 2015-11-19 DIAGNOSIS — M5416 Radiculopathy, lumbar region: Secondary | ICD-10-CM | POA: Diagnosis not present

## 2015-11-19 DIAGNOSIS — R2689 Other abnormalities of gait and mobility: Secondary | ICD-10-CM | POA: Diagnosis not present

## 2015-11-19 DIAGNOSIS — M25561 Pain in right knee: Secondary | ICD-10-CM | POA: Diagnosis not present

## 2015-11-26 DIAGNOSIS — M79641 Pain in right hand: Secondary | ICD-10-CM | POA: Diagnosis not present

## 2015-11-26 DIAGNOSIS — M79631 Pain in right forearm: Secondary | ICD-10-CM | POA: Diagnosis not present

## 2015-11-26 DIAGNOSIS — M542 Cervicalgia: Secondary | ICD-10-CM | POA: Diagnosis not present

## 2015-11-26 DIAGNOSIS — M5412 Radiculopathy, cervical region: Secondary | ICD-10-CM | POA: Diagnosis not present

## 2015-12-03 DIAGNOSIS — M542 Cervicalgia: Secondary | ICD-10-CM | POA: Diagnosis not present

## 2015-12-03 DIAGNOSIS — M79631 Pain in right forearm: Secondary | ICD-10-CM | POA: Diagnosis not present

## 2015-12-03 DIAGNOSIS — M5412 Radiculopathy, cervical region: Secondary | ICD-10-CM | POA: Diagnosis not present

## 2015-12-03 DIAGNOSIS — M79641 Pain in right hand: Secondary | ICD-10-CM | POA: Diagnosis not present

## 2015-12-08 DIAGNOSIS — M79641 Pain in right hand: Secondary | ICD-10-CM | POA: Diagnosis not present

## 2015-12-08 DIAGNOSIS — M79631 Pain in right forearm: Secondary | ICD-10-CM | POA: Diagnosis not present

## 2015-12-08 DIAGNOSIS — M542 Cervicalgia: Secondary | ICD-10-CM | POA: Diagnosis not present

## 2015-12-08 DIAGNOSIS — M5412 Radiculopathy, cervical region: Secondary | ICD-10-CM | POA: Diagnosis not present

## 2015-12-10 DIAGNOSIS — M79631 Pain in right forearm: Secondary | ICD-10-CM | POA: Diagnosis not present

## 2015-12-10 DIAGNOSIS — M542 Cervicalgia: Secondary | ICD-10-CM | POA: Diagnosis not present

## 2015-12-10 DIAGNOSIS — M79641 Pain in right hand: Secondary | ICD-10-CM | POA: Diagnosis not present

## 2015-12-10 DIAGNOSIS — M5412 Radiculopathy, cervical region: Secondary | ICD-10-CM | POA: Diagnosis not present

## 2015-12-15 DIAGNOSIS — M79631 Pain in right forearm: Secondary | ICD-10-CM | POA: Diagnosis not present

## 2015-12-15 DIAGNOSIS — M5412 Radiculopathy, cervical region: Secondary | ICD-10-CM | POA: Diagnosis not present

## 2015-12-15 DIAGNOSIS — M542 Cervicalgia: Secondary | ICD-10-CM | POA: Diagnosis not present

## 2015-12-15 DIAGNOSIS — M79641 Pain in right hand: Secondary | ICD-10-CM | POA: Diagnosis not present

## 2015-12-17 DIAGNOSIS — M79631 Pain in right forearm: Secondary | ICD-10-CM | POA: Diagnosis not present

## 2015-12-17 DIAGNOSIS — M5412 Radiculopathy, cervical region: Secondary | ICD-10-CM | POA: Diagnosis not present

## 2015-12-17 DIAGNOSIS — M542 Cervicalgia: Secondary | ICD-10-CM | POA: Diagnosis not present

## 2015-12-17 DIAGNOSIS — M79641 Pain in right hand: Secondary | ICD-10-CM | POA: Diagnosis not present

## 2015-12-18 DIAGNOSIS — L84 Corns and callosities: Secondary | ICD-10-CM | POA: Diagnosis not present

## 2015-12-18 DIAGNOSIS — H5412 Blindness, left eye, low vision right eye: Secondary | ICD-10-CM | POA: Diagnosis not present

## 2015-12-18 DIAGNOSIS — F5101 Primary insomnia: Secondary | ICD-10-CM | POA: Diagnosis not present

## 2015-12-18 DIAGNOSIS — L602 Onychogryphosis: Secondary | ICD-10-CM | POA: Diagnosis not present

## 2015-12-18 DIAGNOSIS — R2689 Other abnormalities of gait and mobility: Secondary | ICD-10-CM | POA: Diagnosis not present

## 2015-12-18 DIAGNOSIS — G609 Hereditary and idiopathic neuropathy, unspecified: Secondary | ICD-10-CM | POA: Diagnosis not present

## 2015-12-18 DIAGNOSIS — G5600 Carpal tunnel syndrome, unspecified upper limb: Secondary | ICD-10-CM | POA: Diagnosis not present

## 2015-12-23 DIAGNOSIS — M5412 Radiculopathy, cervical region: Secondary | ICD-10-CM | POA: Diagnosis not present

## 2015-12-23 DIAGNOSIS — M542 Cervicalgia: Secondary | ICD-10-CM | POA: Diagnosis not present

## 2015-12-23 DIAGNOSIS — M79641 Pain in right hand: Secondary | ICD-10-CM | POA: Diagnosis not present

## 2015-12-23 DIAGNOSIS — M79631 Pain in right forearm: Secondary | ICD-10-CM | POA: Diagnosis not present

## 2015-12-24 DIAGNOSIS — M79631 Pain in right forearm: Secondary | ICD-10-CM | POA: Diagnosis not present

## 2015-12-24 DIAGNOSIS — M5412 Radiculopathy, cervical region: Secondary | ICD-10-CM | POA: Diagnosis not present

## 2015-12-24 DIAGNOSIS — M542 Cervicalgia: Secondary | ICD-10-CM | POA: Diagnosis not present

## 2015-12-24 DIAGNOSIS — M79641 Pain in right hand: Secondary | ICD-10-CM | POA: Diagnosis not present

## 2015-12-29 DIAGNOSIS — M542 Cervicalgia: Secondary | ICD-10-CM | POA: Diagnosis not present

## 2015-12-29 DIAGNOSIS — M79631 Pain in right forearm: Secondary | ICD-10-CM | POA: Diagnosis not present

## 2015-12-29 DIAGNOSIS — M5412 Radiculopathy, cervical region: Secondary | ICD-10-CM | POA: Diagnosis not present

## 2015-12-29 DIAGNOSIS — M79641 Pain in right hand: Secondary | ICD-10-CM | POA: Diagnosis not present

## 2015-12-30 DIAGNOSIS — M79641 Pain in right hand: Secondary | ICD-10-CM | POA: Diagnosis not present

## 2015-12-30 DIAGNOSIS — M79631 Pain in right forearm: Secondary | ICD-10-CM | POA: Diagnosis not present

## 2015-12-30 DIAGNOSIS — M5412 Radiculopathy, cervical region: Secondary | ICD-10-CM | POA: Diagnosis not present

## 2015-12-30 DIAGNOSIS — M542 Cervicalgia: Secondary | ICD-10-CM | POA: Diagnosis not present

## 2016-01-05 DIAGNOSIS — M79631 Pain in right forearm: Secondary | ICD-10-CM | POA: Diagnosis not present

## 2016-01-05 DIAGNOSIS — M79641 Pain in right hand: Secondary | ICD-10-CM | POA: Diagnosis not present

## 2016-01-05 DIAGNOSIS — M542 Cervicalgia: Secondary | ICD-10-CM | POA: Diagnosis not present

## 2016-01-05 DIAGNOSIS — M5412 Radiculopathy, cervical region: Secondary | ICD-10-CM | POA: Diagnosis not present

## 2016-01-07 DIAGNOSIS — M5412 Radiculopathy, cervical region: Secondary | ICD-10-CM | POA: Diagnosis not present

## 2016-01-07 DIAGNOSIS — M79631 Pain in right forearm: Secondary | ICD-10-CM | POA: Diagnosis not present

## 2016-01-07 DIAGNOSIS — M79641 Pain in right hand: Secondary | ICD-10-CM | POA: Diagnosis not present

## 2016-01-07 DIAGNOSIS — M542 Cervicalgia: Secondary | ICD-10-CM | POA: Diagnosis not present

## 2016-01-12 DIAGNOSIS — M542 Cervicalgia: Secondary | ICD-10-CM | POA: Diagnosis not present

## 2016-01-12 DIAGNOSIS — M79641 Pain in right hand: Secondary | ICD-10-CM | POA: Diagnosis not present

## 2016-01-12 DIAGNOSIS — M79631 Pain in right forearm: Secondary | ICD-10-CM | POA: Diagnosis not present

## 2016-01-12 DIAGNOSIS — M5412 Radiculopathy, cervical region: Secondary | ICD-10-CM | POA: Diagnosis not present

## 2016-01-14 DIAGNOSIS — M79631 Pain in right forearm: Secondary | ICD-10-CM | POA: Diagnosis not present

## 2016-01-14 DIAGNOSIS — M542 Cervicalgia: Secondary | ICD-10-CM | POA: Diagnosis not present

## 2016-01-14 DIAGNOSIS — M79641 Pain in right hand: Secondary | ICD-10-CM | POA: Diagnosis not present

## 2016-01-14 DIAGNOSIS — M5412 Radiculopathy, cervical region: Secondary | ICD-10-CM | POA: Diagnosis not present

## 2016-01-29 DIAGNOSIS — Z6835 Body mass index (BMI) 35.0-35.9, adult: Secondary | ICD-10-CM | POA: Diagnosis not present

## 2016-01-29 DIAGNOSIS — G609 Hereditary and idiopathic neuropathy, unspecified: Secondary | ICD-10-CM | POA: Diagnosis not present

## 2016-01-29 DIAGNOSIS — Z1389 Encounter for screening for other disorder: Secondary | ICD-10-CM | POA: Diagnosis not present

## 2016-02-23 DIAGNOSIS — I10 Essential (primary) hypertension: Secondary | ICD-10-CM | POA: Diagnosis not present

## 2016-02-23 DIAGNOSIS — K219 Gastro-esophageal reflux disease without esophagitis: Secondary | ICD-10-CM | POA: Diagnosis not present

## 2016-02-23 DIAGNOSIS — Z0001 Encounter for general adult medical examination with abnormal findings: Secondary | ICD-10-CM | POA: Diagnosis not present

## 2016-02-23 DIAGNOSIS — Z1389 Encounter for screening for other disorder: Secondary | ICD-10-CM | POA: Diagnosis not present

## 2016-02-23 DIAGNOSIS — E782 Mixed hyperlipidemia: Secondary | ICD-10-CM | POA: Diagnosis not present

## 2016-02-26 DIAGNOSIS — L84 Corns and callosities: Secondary | ICD-10-CM | POA: Diagnosis not present

## 2016-02-26 DIAGNOSIS — L602 Onychogryphosis: Secondary | ICD-10-CM | POA: Diagnosis not present

## 2016-02-29 DIAGNOSIS — R208 Other disturbances of skin sensation: Secondary | ICD-10-CM | POA: Diagnosis not present

## 2016-02-29 DIAGNOSIS — H5442A3 Blindness left eye category 3, normal vision right eye: Secondary | ICD-10-CM | POA: Diagnosis not present

## 2016-02-29 DIAGNOSIS — F5101 Primary insomnia: Secondary | ICD-10-CM | POA: Diagnosis not present

## 2016-03-22 DIAGNOSIS — G5601 Carpal tunnel syndrome, right upper limb: Secondary | ICD-10-CM | POA: Diagnosis not present

## 2016-03-22 DIAGNOSIS — G5602 Carpal tunnel syndrome, left upper limb: Secondary | ICD-10-CM | POA: Diagnosis not present

## 2016-03-29 DIAGNOSIS — Z0000 Encounter for general adult medical examination without abnormal findings: Secondary | ICD-10-CM | POA: Diagnosis not present

## 2016-04-05 DIAGNOSIS — R739 Hyperglycemia, unspecified: Secondary | ICD-10-CM | POA: Diagnosis not present

## 2016-04-05 DIAGNOSIS — F5101 Primary insomnia: Secondary | ICD-10-CM | POA: Diagnosis not present

## 2016-04-22 DIAGNOSIS — G5602 Carpal tunnel syndrome, left upper limb: Secondary | ICD-10-CM | POA: Diagnosis not present

## 2016-04-22 DIAGNOSIS — G5601 Carpal tunnel syndrome, right upper limb: Secondary | ICD-10-CM | POA: Diagnosis not present

## 2016-04-25 DIAGNOSIS — L84 Corns and callosities: Secondary | ICD-10-CM | POA: Diagnosis not present

## 2016-04-25 DIAGNOSIS — L602 Onychogryphosis: Secondary | ICD-10-CM | POA: Diagnosis not present

## 2016-05-02 DIAGNOSIS — G5601 Carpal tunnel syndrome, right upper limb: Secondary | ICD-10-CM | POA: Diagnosis not present

## 2016-05-02 DIAGNOSIS — G5602 Carpal tunnel syndrome, left upper limb: Secondary | ICD-10-CM | POA: Diagnosis not present

## 2016-05-04 DIAGNOSIS — L03032 Cellulitis of left toe: Secondary | ICD-10-CM | POA: Diagnosis not present

## 2016-05-19 DIAGNOSIS — M79603 Pain in arm, unspecified: Secondary | ICD-10-CM | POA: Diagnosis not present

## 2016-05-19 DIAGNOSIS — G5610 Other lesions of median nerve, unspecified upper limb: Secondary | ICD-10-CM | POA: Diagnosis not present

## 2016-05-20 DIAGNOSIS — L03032 Cellulitis of left toe: Secondary | ICD-10-CM | POA: Diagnosis not present

## 2016-05-20 DIAGNOSIS — L03031 Cellulitis of right toe: Secondary | ICD-10-CM | POA: Diagnosis not present

## 2016-05-27 DIAGNOSIS — G5601 Carpal tunnel syndrome, right upper limb: Secondary | ICD-10-CM | POA: Diagnosis not present

## 2016-05-27 DIAGNOSIS — G5602 Carpal tunnel syndrome, left upper limb: Secondary | ICD-10-CM | POA: Diagnosis not present

## 2016-06-02 DIAGNOSIS — Z1389 Encounter for screening for other disorder: Secondary | ICD-10-CM | POA: Diagnosis not present

## 2016-06-02 DIAGNOSIS — L819 Disorder of pigmentation, unspecified: Secondary | ICD-10-CM | POA: Diagnosis not present

## 2016-07-04 ENCOUNTER — Ambulatory Visit (INDEPENDENT_AMBULATORY_CARE_PROVIDER_SITE_OTHER): Payer: Medicare Other | Admitting: Neurology

## 2016-07-04 ENCOUNTER — Encounter: Payer: Self-pay | Admitting: Neurology

## 2016-07-04 VITALS — BP 104/71 | HR 61 | Ht 67.0 in | Wt 194.5 lb

## 2016-07-04 DIAGNOSIS — M79602 Pain in left arm: Secondary | ICD-10-CM | POA: Diagnosis not present

## 2016-07-04 DIAGNOSIS — E538 Deficiency of other specified B group vitamins: Secondary | ICD-10-CM | POA: Diagnosis not present

## 2016-07-04 DIAGNOSIS — M79601 Pain in right arm: Secondary | ICD-10-CM | POA: Diagnosis not present

## 2016-07-04 DIAGNOSIS — R202 Paresthesia of skin: Secondary | ICD-10-CM

## 2016-07-04 MED ORDER — DULOXETINE HCL 30 MG PO CPEP: 30.0000 mg | ORAL_CAPSULE | Freq: Every day | ORAL | 3 refills | Status: DC

## 2016-07-04 NOTE — Progress Notes (Signed)
Reason for visit: Bilateral hand discomfort  Referring physician: Dr. [name] is a 71 y.o. female  History of present illness:  Melissa Rosales is a 71 year old right-handed black female with a history of onset of hand discomfort that began about one year ago. The patient has noted tingling in the right hand in particular, but more recently similar problems have developed in the left hand. The patient notes that she is cold sensitive, if she has cold exposure to the arm, she has increased symptoms with discomfort, and some of the discomfort may go up to the elbow and shoulder. She denies any significant neck discomfort. The patient has been seen previously by Dr. [name], and an EMG nerve conduction study was done. The patient was not told anything about the results. The patient eventually was seen by Dr. [name] and repeat nerve conduction studies were done on both arms. No evidence of carpal tunnel syndrome was seen. The patient does not report significant weakness with either arm, she also has some numbness and tingling in the right foot and leg, with some discomfort in the low back. She has been told she has a pinched nerve in the back. She denies any numbness on the face or any headaches or dizziness. She denies issues controlling the bowels or the bladder. She uses a cane for ambulation and she does believe that her balance has altered over the last year. She comes to this office for further evaluation.  Past Medical History:  Diagnosis Date  . Acid reflux   . Arthritis   . Hypertension     Past Surgical History:  Procedure Laterality Date  . VAGINAL HYSTERECTOMY    . vision problems since birth      Family History  Problem Relation Age of Onset  . Arthritis    . Depression Sister     Social history:  reports that she has never smoked. She has never used smokeless tobacco. She reports that she does not drink alcohol or use drugs.  Medications:  Prior to  Admission medications   Medication Sig Start Date End Date Taking? Authorizing Provider  amLODipine (NORVASC) 10 MG tablet Take 10 mg by mouth daily.   Yes Historical Provider, MD  aspirin EC 81 MG tablet Take 81 mg by mouth daily.   Yes Historical Provider, MD  docusate sodium (COLACE) 100 MG capsule Take 100 mg by mouth daily as needed for constipation.   Yes Historical Provider, MD  enalapril (VASOTEC) 20 MG tablet Take 20 mg by mouth daily.   Yes Historical Provider, MD  gabapentin (NEURONTIN) 300 MG capsule Take 300 mg by mouth 3 (three) times daily. Takes with 800 mg tablet to equal dose of 1100 mg three times daily   Yes Historical Provider, MD  hydrochlorothiazide (HYDRODIURIL) 25 MG tablet Take 25 mg by mouth daily.   Yes Historical Provider, MD  ranitidine (ZANTAC) 150 MG tablet Take 150 mg by mouth 2 (two) times daily.   Yes Historical Provider, MD  DULoxetine (CYMBALTA) 30 MG capsule Take 1 capsule (30 mg total) by mouth daily. 07/04/16   [name], MD  FLUoxetine (PROZAC) 20 MG tablet Take 20 mg by mouth daily.    Historical Provider, MD  nabumetone (RELAFEN) 500 MG tablet Take 1,000 mg by mouth daily.    Historical Provider, MD      Allergies  Allergen Reactions  . Keflex [Cephalexin]     Patient just got prescription filled for  this, and she said she started vomiting after she took it    ROS:  Out of a complete 14 system review of symptoms, the patient complains only of the following symptoms, and all other reviewed systems are negative.  Hand numbness and pain  Blood pressure 104/71, pulse 61, height 5\' 7"  (1.702 m), weight 194 lb 8 oz (88.2 kg).  Physical Exam  General: The patient is alert and cooperative at the time of the examination. The patient is moderately obese.  Eyes: Pupils are equal, round, and reactive to light. Discs are flat bilaterally.  Neck: The neck is supple, no carotid bruits are noted.  Respiratory: The respiratory examination is  clear.  Cardiovascular: The cardiovascular examination reveals a regular rate and rhythm, no obvious murmurs or rubs are noted.  Skin: Extremities are without significant edema.  Neurologic Exam  Mental status: The patient is alert and oriented x 3 at the time of the examination. The patient has apparent normal recent and remote memory, with an apparently normal attention span and concentration ability.  Cranial nerves: Facial symmetry is present. There is good sensation of the face to pinprick and soft touch bilaterally. The strength of the facial muscles and the muscles to head turning and shoulder shrug are normal bilaterally. Speech is well enunciated, no aphasia or dysarthria is noted. Extraocular movements are full on the right, with primary gaze there is exotropia of the left eye, ptosis. Visual fields are full. The tongue is midline, and the patient has symmetric elevation of the soft palate. No obvious hearing deficits are noted.  Motor: The motor testing reveals 5 over 5 strength of all 4 extremities. Good symmetric motor tone is noted throughout.  Sensory: Sensory testing is intact to pinprick, soft touch, vibration sensation, and position sense on all 4 extremities. No evidence of extinction is noted.  Coordination: Cerebellar testing reveals good finger-nose-finger and heel-to-shin bilaterally.  Gait and station: Gait is slightly wide-based, the patient uses a cane for ambulation. Tandem gait was not attempted. Romberg is negative. No drift is seen.  Reflexes: Deep tendon reflexes are symmetric and normal bilaterally. Toes are downgoing bilaterally.   Assessment/Plan:  1. Bilateral hand discomfort  The etiology of the discomfort of the hands is not clear. A neuropathy has not been demonstrated on the upper extremities. The patient notes that the right hand is worse than the left, symptoms are worsened by cold exposure. She will be sent for blood work today. She is already on  gabapentin for discomfort without benefit, we will add Cymbalta. The patient will be sent for MRI of the cervical spine and she will follow-up in 4 months.  [name] MD 07/04/2016 3:21 PM  [facility] [address] [place], [place] [address]  Phone [phone] Fax [phone]

## 2016-07-04 NOTE — Patient Instructions (Signed)
   We will get blood work today and get MRI of the brain. 

## 2016-07-05 ENCOUNTER — Telehealth: Payer: Self-pay | Admitting: *Deleted

## 2016-07-05 LAB — RHEUMATOID FACTOR

## 2016-07-05 LAB — RPR: RPR: NONREACTIVE

## 2016-07-05 LAB — SEDIMENTATION RATE: Sed Rate: 44 mm/hr — ABNORMAL HIGH (ref 0–40)

## 2016-07-05 LAB — VITAMIN B12

## 2016-07-05 LAB — ANA W/REFLEX: Anti Nuclear Antibody(ANA): NEGATIVE

## 2016-07-05 LAB — B. BURGDORFI ANTIBODIES: Lyme IgG/IgM Ab: <0.91 {ISR} (ref 0.00–0.90)

## 2016-07-05 LAB — ANGIOTENSIN CONVERTING ENZYME: Angio Convert Enzyme: 45 U/L (ref 14–82)

## 2016-07-05 NOTE — Telephone Encounter (Signed)
Tried home number, phone continued to ring  and unable to LVM. Stated "out of memory" "please enter remote access code".  Tried mobile number, LVM for her to call about results. Gave [facility] phone number.

## 2016-07-05 NOTE — Telephone Encounter (Signed)
-----   Message from [name], MD sent at 07/05/2016  1:44 PM EST -----  The blood work results are unremarkable, with exception of a very minimal elevation in sedimentation rate, not likely to be clinically significant. Please call the patient. ----- Message ----- From: Lavone Neri Lab Results In Sent: 07/05/2016   7:42 AM To: [name], MD

## 2016-07-12 ENCOUNTER — Other Ambulatory Visit: Payer: Self-pay

## 2016-07-15 DIAGNOSIS — G4700 Insomnia, unspecified: Secondary | ICD-10-CM | POA: Diagnosis not present

## 2016-07-15 DIAGNOSIS — Z1389 Encounter for screening for other disorder: Secondary | ICD-10-CM | POA: Diagnosis not present

## 2016-08-02 ENCOUNTER — Telehealth: Payer: Self-pay | Admitting: Neurology

## 2016-08-02 MED ORDER — DULOXETINE HCL 30 MG PO CPEP: 30.0000 mg | ORAL_CAPSULE | Freq: Two times a day (BID) | ORAL | 3 refills | Status: DC

## 2016-08-02 NOTE — Telephone Encounter (Signed)
Patient states DULoxetine (CYMBALTA) 30 MG capsule not helping with pain, tingling in her hand.

## 2016-08-02 NOTE — Addendum Note (Signed)
Addended by: Margette Fast on: 08/02/2016 02:02 PM   Modules accepted: Orders

## 2016-08-02 NOTE — Telephone Encounter (Signed)
I called the patient. The patient is still having some discomfort in the hands, she has had 2 nerve conduction studies done that did not show evidence of a neuropathy such as carpal tunnel syndrome. We will go up on the Cymbalta taking 30 mg twice daily. The patient will be having MRI of the cervical spine in the next several days.

## 2016-08-02 NOTE — Telephone Encounter (Signed)
Dr Jannifer Franklin- please advise. You last saw pt 07/04/16

## 2016-08-05 ENCOUNTER — Ambulatory Visit
Admission: RE | Admit: 2016-08-05 | Discharge: 2016-08-05 | Disposition: A | Discharge status: Home / Self Care | Payer: Medicare Other | Source: Ambulatory Visit | Attending: Neurology | Admitting: Neurology

## 2016-08-05 DIAGNOSIS — R202 Paresthesia of skin: Principal | ICD-10-CM

## 2016-08-05 DIAGNOSIS — M79601 Pain in right arm: Secondary | ICD-10-CM | POA: Diagnosis not present

## 2016-08-05 DIAGNOSIS — M79602 Pain in left arm: Secondary | ICD-10-CM | POA: Diagnosis not present

## 2016-08-05 DIAGNOSIS — M4802 Spinal stenosis, cervical region: Secondary | ICD-10-CM | POA: Diagnosis not present

## 2016-08-07 ENCOUNTER — Telehealth: Payer: Self-pay | Admitting: Neurology

## 2016-08-07 DIAGNOSIS — M4712 Other spondylosis with myelopathy, cervical region: Secondary | ICD-10-CM

## 2016-08-07 NOTE — Telephone Encounter (Signed)
  I called the patient. The MRI of the cervical spine shows cord compression. I have reccommeded a neurosurgical referral, she is to call our office regarding this tomorrow.   MRI cervical 08/07/16:  IMPRESSION:  This MRI of the cervical spine without contrast shows multilevel changes as detailed above. The most significant findings are: 1.    There is scoliosis that is convex to the right.. 2.    At C3-C4 there are degenerative changes causing right greater than left foraminal narrowing that could lead to right C4 nerve root compression. 3.    At C4-C5, there is severe spinal stenosis and apparent myelopathic signal within the spinal cord due to severe degenerative changes. There is bilateral moderate foraminal narrowing but no nerve root compression.. 4.    At C5-C6, there is moderate spinal stenosis and edema within the disc. There does not appear to be nerve root compression. 5.    At C6-C7 there is moderately severe spinal stenosis, worse on the right.  The spinal cord has normal signal. 6.    At C7-T1, there is mild spinal stenosis ages causing right greater than left foraminal narrowing at could lead to right C8 nerve root compression.

## 2016-08-08 NOTE — Telephone Encounter (Signed)
I called and talked with the patient. The patient has severe spinal cord compression at the C4-5 level, I have recommended a neurosurgical referral. The patient wants to "think about it" and she will call me back.  I indicated that this is a serious problem, I would not wait too long to make a decision.

## 2016-08-09 NOTE — Telephone Encounter (Signed)
I called the patient back, left a message. I will make a referral to [place] [facility], hopefully we can get the patient in quickly.

## 2016-08-09 NOTE — Telephone Encounter (Signed)
Patient called office in reference to neurosurgical referral.  Patient would like to move forward with the referral please.  Patient would like to know the information of where she will be getting referred to.  Please call after 1:00pm per patient.

## 2016-08-09 NOTE — Addendum Note (Signed)
Addended by: [name] on: 08/09/2016 11:00 AM   Modules accepted: Orders

## 2016-08-16 NOTE — Telephone Encounter (Signed)
Pt called because she has not heard from [place] [facility] and she very much would like to move forward re: the surgery. Pt also stated that the last medication given to her for her hand is not helping and she would like to have something else called in. Her mobile is best # to reach her on

## 2016-08-17 NOTE — Telephone Encounter (Signed)
I called the patient. The patient indicates that the Cymbalta has not helped her paresthesias in arms. I indicated this is related to her spinal cord compression, she may stop the Cymbalta, we have made a referral to [place] [facility], but she has not heard anything about her appointment yet.  I will call the office of [place] [facility] today, I will find out about her appointment. Their telephone number is [phone].  I called the neurosurgery office, they apparently never got the referral that was sent in on March 20. They will have one of their surgeons look at the scan, try to get her worked in soon.

## 2016-08-23 DIAGNOSIS — Z0000 Encounter for general adult medical examination without abnormal findings: Secondary | ICD-10-CM | POA: Diagnosis not present

## 2016-08-23 DIAGNOSIS — Z1389 Encounter for screening for other disorder: Secondary | ICD-10-CM | POA: Diagnosis not present

## 2016-08-23 DIAGNOSIS — Z7689 Persons encountering health services in other specified circumstances: Secondary | ICD-10-CM | POA: Diagnosis not present

## 2016-08-23 DIAGNOSIS — N342 Other urethritis: Secondary | ICD-10-CM | POA: Diagnosis not present

## 2016-08-26 DIAGNOSIS — M4712 Other spondylosis with myelopathy, cervical region: Secondary | ICD-10-CM | POA: Diagnosis not present

## 2016-08-26 DIAGNOSIS — M5000 Cervical disc disorder with myelopathy, unspecified cervical region: Secondary | ICD-10-CM | POA: Diagnosis not present

## 2016-08-26 DIAGNOSIS — M5030 Other cervical disc degeneration, unspecified cervical region: Secondary | ICD-10-CM | POA: Diagnosis not present

## 2016-08-26 DIAGNOSIS — M4722 Other spondylosis with radiculopathy, cervical region: Secondary | ICD-10-CM | POA: Diagnosis not present

## 2016-08-26 DIAGNOSIS — M542 Cervicalgia: Secondary | ICD-10-CM | POA: Diagnosis not present

## 2016-09-21 DIAGNOSIS — L84 Corns and callosities: Secondary | ICD-10-CM | POA: Diagnosis not present

## 2016-09-21 DIAGNOSIS — L602 Onychogryphosis: Secondary | ICD-10-CM | POA: Diagnosis not present

## 2016-09-21 DIAGNOSIS — E1351 Other specified diabetes mellitus with diabetic peripheral angiopathy without gangrene: Secondary | ICD-10-CM | POA: Diagnosis not present

## 2016-09-29 ENCOUNTER — Ambulatory Visit (INDEPENDENT_AMBULATORY_CARE_PROVIDER_SITE_OTHER): Payer: Medicare Other | Admitting: Otolaryngology

## 2016-09-29 DIAGNOSIS — H903 Sensorineural hearing loss, bilateral: Secondary | ICD-10-CM | POA: Diagnosis not present

## 2016-09-30 ENCOUNTER — Other Ambulatory Visit: Payer: Self-pay | Admitting: Neurosurgery

## 2016-09-30 DIAGNOSIS — M4712 Other spondylosis with myelopathy, cervical region: Secondary | ICD-10-CM | POA: Diagnosis not present

## 2016-09-30 DIAGNOSIS — M4012 Other secondary kyphosis, cervical region: Secondary | ICD-10-CM | POA: Diagnosis not present

## 2016-09-30 DIAGNOSIS — M5030 Other cervical disc degeneration, unspecified cervical region: Secondary | ICD-10-CM | POA: Diagnosis not present

## 2016-09-30 DIAGNOSIS — M5000 Cervical disc disorder with myelopathy, unspecified cervical region: Secondary | ICD-10-CM | POA: Diagnosis not present

## 2016-10-04 DIAGNOSIS — M79672 Pain in left foot: Secondary | ICD-10-CM | POA: Diagnosis not present

## 2016-10-04 DIAGNOSIS — L03032 Cellulitis of left toe: Secondary | ICD-10-CM | POA: Diagnosis not present

## 2016-10-18 DIAGNOSIS — L02612 Cutaneous abscess of left foot: Secondary | ICD-10-CM | POA: Diagnosis not present

## 2016-10-28 ENCOUNTER — Encounter (HOSPITAL_COMMUNITY): Payer: Self-pay

## 2016-10-28 ENCOUNTER — Encounter (HOSPITAL_COMMUNITY)
Admission: RE | Admit: 2016-10-28 | Discharge: 2016-10-28 | Disposition: A | Discharge status: Home / Self Care | Payer: Medicare Other | Source: Ambulatory Visit | Attending: Neurosurgery | Admitting: Neurosurgery

## 2016-10-28 DIAGNOSIS — M79602 Pain in left arm: Secondary | ICD-10-CM | POA: Diagnosis not present

## 2016-10-28 DIAGNOSIS — M79601 Pain in right arm: Secondary | ICD-10-CM | POA: Insufficient documentation

## 2016-10-28 DIAGNOSIS — I10 Essential (primary) hypertension: Secondary | ICD-10-CM | POA: Insufficient documentation

## 2016-10-28 DIAGNOSIS — M6281 Muscle weakness (generalized): Secondary | ICD-10-CM | POA: Insufficient documentation

## 2016-10-28 DIAGNOSIS — R202 Paresthesia of skin: Secondary | ICD-10-CM | POA: Insufficient documentation

## 2016-10-28 DIAGNOSIS — I252 Old myocardial infarction: Secondary | ICD-10-CM | POA: Diagnosis not present

## 2016-10-28 DIAGNOSIS — K5900 Constipation, unspecified: Secondary | ICD-10-CM | POA: Insufficient documentation

## 2016-10-28 DIAGNOSIS — M25569 Pain in unspecified knee: Secondary | ICD-10-CM | POA: Diagnosis not present

## 2016-10-28 DIAGNOSIS — M5431 Sciatica, right side: Secondary | ICD-10-CM | POA: Diagnosis not present

## 2016-10-28 DIAGNOSIS — M5136 Other intervertebral disc degeneration, lumbar region: Secondary | ICD-10-CM | POA: Diagnosis not present

## 2016-10-28 DIAGNOSIS — K219 Gastro-esophageal reflux disease without esophagitis: Secondary | ICD-10-CM | POA: Insufficient documentation

## 2016-10-28 DIAGNOSIS — R001 Bradycardia, unspecified: Secondary | ICD-10-CM | POA: Insufficient documentation

## 2016-10-28 DIAGNOSIS — Z01812 Encounter for preprocedural laboratory examination: Secondary | ICD-10-CM | POA: Diagnosis not present

## 2016-10-28 DIAGNOSIS — Z01810 Encounter for preprocedural cardiovascular examination: Secondary | ICD-10-CM | POA: Diagnosis not present

## 2016-10-28 LAB — BASIC METABOLIC PANEL
ANION GAP: 7 (ref 5–15)
BUN: 18 mg/dL (ref 6–20)
CO2: 30 mmol/L (ref 22–32)
Calcium: 9.9 mg/dL (ref 8.9–10.3)
Chloride: 100 mmol/L — ABNORMAL LOW (ref 101–111)
Creatinine, Ser: 1.35 mg/dL — ABNORMAL HIGH (ref 0.44–1.00)
GFR calc non Af Amer: 39 mL/min — ABNORMAL LOW (ref >60)
GFR, EST AFRICAN AMERICAN: 45 mL/min — AB (ref >60)
GLUCOSE: 87 mg/dL (ref 65–99)
POTASSIUM: 3.8 mmol/L (ref 3.5–5.1)
Sodium: 137 mmol/L (ref 135–145)

## 2016-10-28 LAB — TYPE AND SCREEN
ABO/RH(D): O POS
Antibody Screen: NEGATIVE

## 2016-10-28 LAB — CBC
HEMATOCRIT: 38.2 % (ref 36.0–46.0)
HEMOGLOBIN: 12.2 g/dL (ref 12.0–15.0)
MCH: 27.3 pg (ref 26.0–34.0)
MCHC: 31.9 g/dL (ref 30.0–36.0)
MCV: 85.5 fL (ref 78.0–100.0)
Platelets: 185 10*3/uL (ref 150–400)
RBC: 4.47 MIL/uL (ref 3.87–5.11)
RDW: 14.8 % (ref 11.5–15.5)
WBC: 8.1 10*3/uL (ref 4.0–10.5)

## 2016-10-28 LAB — ABO/RH: ABO/RH(D): O POS

## 2016-10-28 LAB — SURGICAL PCR SCREEN
MRSA, PCR: NEGATIVE
Staphylococcus aureus: POSITIVE — AB

## 2016-10-28 NOTE — Pre-Procedure Instructions (Signed)
Melissa Rosales  10/28/2016      [facility] [place], [place] [address] Phone: [phone] Fax: [phone]    Your procedure is scheduled on June 18  Report to [facility] at 530 A.M.  Call this number if you have problems the morning of surgery:  [phone]   Remember:  Do not eat food or drink liquids after midnight.  Take these medicines the morning of surgery with A SIP OF WATER amlodipine (Norvasc), gabapentin (Neurontin), ranitidine (Zantac)  Stop taking Meloxicam (Mobic), Ibuprofen, Advil, Motrin, Aleve,BC's, Goody's, Herbal medications, Vitamins, Fish oil Stop/take aspirin as directed by your Dr.   Lazaro Arms not wear jewelry, make-up or nail polish.  Do not wear lotions, powders, or perfumes, or deoderant.  Do not shave 48 hours prior to surgery.  Men may shave face and neck.  Do not bring valuables to the hospital.  [facility] is not responsible for any belongings or valuables.  Contacts, dentures or bridgework may not be worn into surgery.  Leave your suitcase in the car.  After surgery it may be brought to your room.  For patients admitted to the hospital, discharge time will be determined by your treatment team.  Patients discharged the day of surgery will not be allowed to drive home.    Special instructions:  Melissa Rosales - Preparing for Surgery  Before surgery, you can play an important role.  Because skin is not sterile, your skin needs to be as free of germs as possible.  You can reduce the number of germs on you skin by washing with CHG (chlorahexidine gluconate) soap before surgery.  CHG is an antiseptic cleaner which kills germs and bonds with the skin to continue killing germs even after washing.  Please DO NOT use if you have an allergy to CHG or antibacterial soaps.  If your skin becomes reddened/irritated stop using the CHG and inform your nurse when you arrive at Short Stay.  Do  not shave (including legs and underarms) for at least 48 hours prior to the first CHG shower.  You may shave your face.  Please follow these instructions carefully:   1.  Shower with CHG Soap the night before surgery and the                                morning of Surgery.  2.  If you choose to wash your hair, wash your hair first as usual with your       normal shampoo.  3.  After you shampoo, rinse your hair and body thoroughly to remove the                      Shampoo.  4.  Use CHG as you would any other liquid soap.  You can apply chg directly       to the skin and wash gently with scrungie or a clean washcloth.  5.  Apply the CHG Soap to your body ONLY FROM THE NECK DOWN.        Do not use on open wounds or open sores.  Avoid contact with your eyes,       ears, mouth and genitals (private parts).  Wash genitals (private parts)       with your normal soap.  6.  Wash thoroughly, paying special attention  to the area where your surgery        will be performed.  7.  Thoroughly rinse your body with warm water from the neck down.  8.  DO NOT shower/wash with your normal soap after using and rinsing off       the CHG Soap.  9.  Pat yourself dry with a clean towel.            10.  Wear clean pajamas.            11.  Place clean sheets on your bed the night of your first shower and do not        sleep with pets.  Day of Surgery  Do not apply any lotions/deoderants the morning of surgery.  Please wear clean clothes to the hospital/surgery center.    Please read over the following fact sheets that you were given. Pain Booklet, Coughing and Deep Breathing, MRSA Information and Surgical Site Infection Prevention

## 2016-10-28 NOTE — Progress Notes (Signed)
I called a prescription for Mupirocin ointment to [place] [place]

## 2016-10-28 NOTE — Progress Notes (Signed)
PCP is Dr. Redmond School Denies ever seeing a cardiologist.  Denies ever having a card cath, stress test, or echo. Denies any chest pain, fever, shortness of breath, or cough.

## 2016-10-31 NOTE — Progress Notes (Addendum)
Anesthesia Chart Review:  Pt is a 71 year old female scheduled for C4-5 ACDF on 11/07/2016 with Jovita Gamma, M.D.  - PCP is Redmond School, MD  PMH includes: HTN, GERD. Never smoker. BMI 33.5.  Medications include: Amlodipine, ASA 81 mg, enalapril, Lasix, HCTZ, Zantac  Preoperative labs reviewed. Cr 1.35, BUN 18.  This is elevated from prior results from PCP's office 08/24/16 where Cr was 0.85.  I have forwarded BMET results to Dr. Gerarda Fraction for follow up purposes. Will repeat BMET DOS.   EKG 10/28/16: Sinus bradycardia (58 bpm). Septal infarct, age undetermined  If no changes, I anticipate pt can proceed with surgery as scheduled.   Willeen Cass, FNP-BC [facility] Stay Surgical Center/Anesthesiology Phone: [phone] 11/02/2016 9:40 AM

## 2016-11-04 MED ORDER — VANCOMYCIN HCL IN DEXTROSE 1-5 GM/200ML-% IV SOLN
1000.0000 mg | INTRAVENOUS | Status: AC
  Administered 2016-11-07: 1000 mg via INTRAVENOUS
  Filled 2016-11-04: qty 200

## 2016-11-06 NOTE — Anesthesia Preprocedure Evaluation (Addendum)
Anesthesia Evaluation  Patient identified by MRN, date of birth, ID band Patient awake    Reviewed: Allergy & Precautions, H&P , NPO status , Patient's Chart, lab work & pertinent test results  Airway Mallampati: III  TM Distance: >3 FB Neck ROM: Limited    Dental no notable dental hx. (+) Teeth Intact, Dental Advisory Given   Pulmonary neg pulmonary ROS,    Pulmonary exam normal breath sounds clear to auscultation       Cardiovascular Exercise Tolerance: Good hypertension, Pt. on medications  Rhythm:Regular Rate:Normal     Neuro/Psych negative neurological ROS  negative psych ROS   GI/Hepatic Neg liver ROS, GERD  Medicated and Controlled,  Endo/Other  negative endocrine ROS  Renal/GU negative Renal ROS  negative genitourinary   Musculoskeletal  (+) Arthritis , Osteoarthritis,    Abdominal   Peds  Hematology negative hematology ROS (+)   Anesthesia Other Findings   Reproductive/Obstetrics negative OB ROS                            Anesthesia Physical Anesthesia Plan  ASA: II  Anesthesia Plan: General   Post-op Pain Management:    Induction: Intravenous  PONV Risk Score and Plan: 4 or greater and Ondansetron, Dexamethasone, Propofol and Midazolam  Airway Management Planned: Oral ETT  Additional Equipment:   Intra-op Plan:   Post-operative Plan: Extubation in OR  Informed Consent: I have reviewed the patients History and Physical, chart, labs and discussed the procedure including the risks, benefits and alternatives for the proposed anesthesia with the patient or authorized representative who has indicated his/her understanding and acceptance.   Dental advisory given  Plan Discussed with: CRNA  Anesthesia Plan Comments:        Anesthesia Quick Evaluation

## 2016-11-07 ENCOUNTER — Observation Stay (HOSPITAL_COMMUNITY)
Admission: RE | Admit: 2016-11-07 | Discharge: 2016-11-08 | Disposition: A | Discharge status: Home / Self Care | Payer: Medicare Other | Source: Ambulatory Visit | Attending: Neurosurgery | Admitting: Neurosurgery

## 2016-11-07 ENCOUNTER — Ambulatory Visit (HOSPITAL_COMMUNITY): Payer: Medicare Other | Admitting: Emergency Medicine

## 2016-11-07 ENCOUNTER — Encounter (HOSPITAL_COMMUNITY)
Admission: RE | Disposition: A | Discharge status: Home / Self Care | Payer: Self-pay | Source: Ambulatory Visit | Attending: Neurosurgery

## 2016-11-07 ENCOUNTER — Observation Stay (HOSPITAL_COMMUNITY): Payer: Medicare Other | Admitting: Certified Registered Nurse Anesthetist

## 2016-11-07 ENCOUNTER — Encounter (HOSPITAL_COMMUNITY): Payer: Self-pay | Admitting: Certified Registered Nurse Anesthetist

## 2016-11-07 ENCOUNTER — Ambulatory Visit (HOSPITAL_COMMUNITY): Payer: Medicare Other

## 2016-11-07 DIAGNOSIS — M4712 Other spondylosis with myelopathy, cervical region: Secondary | ICD-10-CM | POA: Diagnosis not present

## 2016-11-07 DIAGNOSIS — M50121 Cervical disc disorder at C4-C5 level with radiculopathy: Secondary | ICD-10-CM | POA: Diagnosis not present

## 2016-11-07 DIAGNOSIS — M5136 Other intervertebral disc degeneration, lumbar region: Secondary | ICD-10-CM | POA: Insufficient documentation

## 2016-11-07 DIAGNOSIS — M5441 Lumbago with sciatica, right side: Secondary | ICD-10-CM | POA: Diagnosis not present

## 2016-11-07 DIAGNOSIS — Z419 Encounter for procedure for purposes other than remedying health state, unspecified: Secondary | ICD-10-CM

## 2016-11-07 DIAGNOSIS — Z79899 Other long term (current) drug therapy: Secondary | ICD-10-CM | POA: Insufficient documentation

## 2016-11-07 DIAGNOSIS — M50021 Cervical disc disorder at C4-C5 level with myelopathy: Principal | ICD-10-CM | POA: Insufficient documentation

## 2016-11-07 DIAGNOSIS — M5000 Cervical disc disorder with myelopathy, unspecified cervical region: Secondary | ICD-10-CM | POA: Diagnosis present

## 2016-11-07 DIAGNOSIS — M4802 Spinal stenosis, cervical region: Secondary | ICD-10-CM | POA: Insufficient documentation

## 2016-11-07 DIAGNOSIS — M1990 Unspecified osteoarthritis, unspecified site: Secondary | ICD-10-CM | POA: Diagnosis not present

## 2016-11-07 DIAGNOSIS — K5900 Constipation, unspecified: Secondary | ICD-10-CM | POA: Diagnosis not present

## 2016-11-07 DIAGNOSIS — Z7982 Long term (current) use of aspirin: Secondary | ICD-10-CM | POA: Insufficient documentation

## 2016-11-07 DIAGNOSIS — M4322 Fusion of spine, cervical region: Secondary | ICD-10-CM | POA: Diagnosis not present

## 2016-11-07 DIAGNOSIS — K219 Gastro-esophageal reflux disease without esophagitis: Secondary | ICD-10-CM | POA: Insufficient documentation

## 2016-11-07 DIAGNOSIS — M79601 Pain in right arm: Secondary | ICD-10-CM | POA: Diagnosis not present

## 2016-11-07 DIAGNOSIS — M4722 Other spondylosis with radiculopathy, cervical region: Secondary | ICD-10-CM | POA: Diagnosis not present

## 2016-11-07 DIAGNOSIS — I10 Essential (primary) hypertension: Secondary | ICD-10-CM | POA: Diagnosis not present

## 2016-11-07 DIAGNOSIS — M5431 Sciatica, right side: Secondary | ICD-10-CM | POA: Insufficient documentation

## 2016-11-07 DIAGNOSIS — M6281 Muscle weakness (generalized): Secondary | ICD-10-CM | POA: Insufficient documentation

## 2016-11-07 DIAGNOSIS — M79602 Pain in left arm: Secondary | ICD-10-CM | POA: Insufficient documentation

## 2016-11-07 HISTORY — PX: HARDWARE REMOVAL: SHX979

## 2016-11-07 HISTORY — PX: ANTERIOR CERVICAL DECOMP/DISCECTOMY FUSION: SHX1161

## 2016-11-07 LAB — BASIC METABOLIC PANEL
ANION GAP: 10 (ref 5–15)
BUN: 18 mg/dL (ref 6–20)
CALCIUM: 9.5 mg/dL (ref 8.9–10.3)
CHLORIDE: 105 mmol/L (ref 101–111)
CO2: 26 mmol/L (ref 22–32)
Creatinine, Ser: 0.99 mg/dL (ref 0.44–1.00)
GFR calc Af Amer: >60 mL/min (ref >60)
GFR calc non Af Amer: 56 mL/min — ABNORMAL LOW (ref >60)
Glucose, Bld: 91 mg/dL (ref 65–99)
Potassium: 3.3 mmol/L — ABNORMAL LOW (ref 3.5–5.1)
SODIUM: 141 mmol/L (ref 135–145)

## 2016-11-07 SURGERY — ANTERIOR CERVICAL DECOMPRESSION/DISCECTOMY FUSION 1 LEVEL
Anesthesia: General

## 2016-11-07 SURGERY — REMOVAL, HARDWARE
Anesthesia: General | Site: Back

## 2016-11-07 MED ORDER — CHLORHEXIDINE GLUCONATE CLOTH 2 % EX PADS: 6.0000 | MEDICATED_PAD | Freq: Once | CUTANEOUS | Status: DC

## 2016-11-07 MED ORDER — EPHEDRINE 5 MG/ML INJ
INTRAVENOUS | Status: AC
  Filled 2016-11-07: qty 10

## 2016-11-07 MED ORDER — FENTANYL CITRATE (PF) 250 MCG/5ML IJ SOLN
INTRAMUSCULAR | Status: AC
  Filled 2016-11-07: qty 5

## 2016-11-07 MED ORDER — BUPIVACAINE HCL (PF) 0.5 % IJ SOLN
INTRAMUSCULAR | Status: DC | PRN
  Administered 2016-11-07: 5 mL

## 2016-11-07 MED ORDER — MIDAZOLAM HCL 2 MG/2ML IJ SOLN
INTRAMUSCULAR | Status: AC
  Filled 2016-11-07: qty 2

## 2016-11-07 MED ORDER — LIDOCAINE-EPINEPHRINE 1 %-1:100000 IJ SOLN
INTRAMUSCULAR | Status: DC | PRN
  Administered 2016-11-07: 5 mL

## 2016-11-07 MED ORDER — BUPIVACAINE HCL (PF) 0.5 % IJ SOLN
INTRAMUSCULAR | Status: AC
  Filled 2016-11-07: qty 30

## 2016-11-07 MED ORDER — SUGAMMADEX SODIUM 200 MG/2ML IV SOLN
INTRAVENOUS | Status: DC | PRN
  Administered 2016-11-07: 200 mg via INTRAVENOUS

## 2016-11-07 MED ORDER — BACITRACIN 50000 UNITS IM SOLR
INTRAMUSCULAR | Status: DC | PRN
  Administered 2016-11-07 (×2)

## 2016-11-07 MED ORDER — MORPHINE SULFATE (PF) 4 MG/ML IV SOLN: 4.0000 mg | INTRAVENOUS | Status: DC | PRN

## 2016-11-07 MED ORDER — THROMBIN 5000 UNITS EX SOLR
CUTANEOUS | Status: DC | PRN
  Administered 2016-11-07: 10000 [IU] via TOPICAL

## 2016-11-07 MED ORDER — BACITRACIN ZINC 500 UNIT/GM EX OINT
TOPICAL_OINTMENT | CUTANEOUS | Status: AC
  Filled 2016-11-07: qty 28.35

## 2016-11-07 MED ORDER — EPHEDRINE SULFATE 50 MG/ML IJ SOLN
INTRAMUSCULAR | Status: DC | PRN
  Administered 2016-11-07 (×2): 10 mg via INTRAVENOUS

## 2016-11-07 MED ORDER — THROMBIN 5000 UNITS EX SOLR
CUTANEOUS | Status: AC
  Filled 2016-11-07: qty 15000

## 2016-11-07 MED ORDER — KETOROLAC TROMETHAMINE 15 MG/ML IJ SOLN
INTRAMUSCULAR | Status: AC
  Filled 2016-11-07: qty 1

## 2016-11-07 MED ORDER — LIDOCAINE HCL (CARDIAC) 20 MG/ML IV SOLN
INTRAVENOUS | Status: DC | PRN
  Administered 2016-11-07: 60 mg via INTRAVENOUS

## 2016-11-07 MED ORDER — SODIUM CHLORIDE 0.9% FLUSH: 3.0000 mL | Freq: Two times a day (BID) | INTRAVENOUS | Status: DC

## 2016-11-07 MED ORDER — SUCCINYLCHOLINE CHLORIDE 20 MG/ML IJ SOLN
INTRAMUSCULAR | Status: DC | PRN
  Administered 2016-11-07: 100 mg via INTRAVENOUS

## 2016-11-07 MED ORDER — HYDROXYZINE HCL 25 MG PO TABS: 50.0000 mg | ORAL_TABLET | ORAL | Status: DC | PRN

## 2016-11-07 MED ORDER — SUCCINYLCHOLINE CHLORIDE 200 MG/10ML IV SOSY
PREFILLED_SYRINGE | INTRAVENOUS | Status: AC
  Filled 2016-11-07: qty 10

## 2016-11-07 MED ORDER — LIDOCAINE-EPINEPHRINE 1 %-1:100000 IJ SOLN
INTRAMUSCULAR | Status: AC
  Filled 2016-11-07: qty 1

## 2016-11-07 MED ORDER — THROMBIN 5000 UNITS EX SOLR
CUTANEOUS | Status: DC | PRN
  Administered 2016-11-07: 09:00:00 via TOPICAL

## 2016-11-07 MED ORDER — ACETAMINOPHEN 650 MG RE SUPP: 650.0000 mg | RECTAL | Status: DC | PRN

## 2016-11-07 MED ORDER — GENTAMICIN IN SALINE 1.6-0.9 MG/ML-% IV SOLN
80.0000 mg | INTRAVENOUS | Status: AC
  Administered 2016-11-07: 80 mg via INTRAVENOUS
  Filled 2016-11-07: qty 50

## 2016-11-07 MED ORDER — PROPOFOL 10 MG/ML IV BOLUS
INTRAVENOUS | Status: DC | PRN
  Administered 2016-11-07: 100 mg via INTRAVENOUS

## 2016-11-07 MED ORDER — AMLODIPINE BESYLATE 10 MG PO TABS
10.0000 mg | ORAL_TABLET | Freq: Every day | ORAL | Status: DC
Start: 2016-11-08 — End: 2016-11-08
  Filled 2016-11-07: qty 1

## 2016-11-07 MED ORDER — GABAPENTIN 100 MG PO CAPS
100.0000 mg | ORAL_CAPSULE | Freq: Three times a day (TID) | ORAL | Status: DC
  Administered 2016-11-07 (×2): 100 mg via ORAL
  Filled 2016-11-07 (×2): qty 1

## 2016-11-07 MED ORDER — BISACODYL 10 MG RE SUPP: 10.0000 mg | Freq: Every day | RECTAL | Status: DC | PRN

## 2016-11-07 MED ORDER — FAMOTIDINE 20 MG PO TABS
20.0000 mg | ORAL_TABLET | Freq: Two times a day (BID) | ORAL | Status: DC
  Administered 2016-11-07: 20 mg via ORAL
  Filled 2016-11-07: qty 1

## 2016-11-07 MED ORDER — FENTANYL CITRATE (PF) 100 MCG/2ML IJ SOLN
INTRAMUSCULAR | Status: DC | PRN
  Administered 2016-11-07: 50 ug via INTRAVENOUS
  Administered 2016-11-07 (×2): 100 ug via INTRAVENOUS

## 2016-11-07 MED ORDER — ONDANSETRON HCL 4 MG PO TABS: 4.0000 mg | ORAL_TABLET | Freq: Four times a day (QID) | ORAL | Status: DC | PRN

## 2016-11-07 MED ORDER — HYDROCHLOROTHIAZIDE 25 MG PO TABS: 25.0000 mg | ORAL_TABLET | Freq: Every day | ORAL | Status: DC

## 2016-11-07 MED ORDER — ONDANSETRON HCL 4 MG/2ML IJ SOLN
INTRAMUSCULAR | Status: DC | PRN
  Administered 2016-11-07: 4 mg via INTRAVENOUS

## 2016-11-07 MED ORDER — ENALAPRIL MALEATE 20 MG PO TABS
20.0000 mg | ORAL_TABLET | Freq: Every evening | ORAL | Status: DC
  Filled 2016-11-07 (×2): qty 1

## 2016-11-07 MED ORDER — KETOROLAC TROMETHAMINE 30 MG/ML IJ SOLN
7.5000 mg | Freq: Four times a day (QID) | INTRAMUSCULAR | Status: DC
  Administered 2016-11-07 – 2016-11-08 (×3): 7.5 mg via INTRAVENOUS
  Filled 2016-11-07 (×3): qty 1

## 2016-11-07 MED ORDER — GLYCOPYRROLATE 0.2 MG/ML IJ SOLN
INTRAMUSCULAR | Status: DC | PRN
  Administered 2016-11-07: 0.2 mg via INTRAVENOUS

## 2016-11-07 MED ORDER — HYDROMORPHONE HCL 1 MG/ML IJ SOLN: 0.2500 mg | INTRAMUSCULAR | Status: DC | PRN

## 2016-11-07 MED ORDER — PHENYLEPHRINE 40 MCG/ML (10ML) SYRINGE FOR IV PUSH (FOR BLOOD PRESSURE SUPPORT)
PREFILLED_SYRINGE | INTRAVENOUS | Status: AC
  Filled 2016-11-07: qty 10

## 2016-11-07 MED ORDER — ONDANSETRON HCL 4 MG/2ML IJ SOLN: 4.0000 mg | Freq: Four times a day (QID) | INTRAMUSCULAR | Status: DC | PRN

## 2016-11-07 MED ORDER — MIDAZOLAM HCL 5 MG/5ML IJ SOLN
INTRAMUSCULAR | Status: DC | PRN
  Administered 2016-11-07: 2 mg via INTRAVENOUS

## 2016-11-07 MED ORDER — MENTHOL 3 MG MT LOZG: 1.0000 | LOZENGE | OROMUCOSAL | Status: DC | PRN

## 2016-11-07 MED ORDER — ACETAMINOPHEN 10 MG/ML IV SOLN
INTRAVENOUS | Status: AC
  Filled 2016-11-07: qty 100

## 2016-11-07 MED ORDER — FENTANYL CITRATE (PF) 100 MCG/2ML IJ SOLN
INTRAMUSCULAR | Status: DC | PRN
  Administered 2016-11-07: 50 ug via INTRAVENOUS

## 2016-11-07 MED ORDER — 0.9 % SODIUM CHLORIDE (POUR BTL) OPTIME
TOPICAL | Status: DC | PRN
  Administered 2016-11-07: 1000 mL

## 2016-11-07 MED ORDER — KCL IN DEXTROSE-NACL 40-5-0.45 MEQ/L-%-% IV SOLN
INTRAVENOUS | Status: DC
  Filled 2016-11-07: qty 1000

## 2016-11-07 MED ORDER — FLEET ENEMA 7-19 GM/118ML RE ENEM: 1.0000 | ENEMA | Freq: Once | RECTAL | Status: DC | PRN

## 2016-11-07 MED ORDER — ONDANSETRON HCL 4 MG/2ML IJ SOLN
INTRAMUSCULAR | Status: AC
  Filled 2016-11-07: qty 2

## 2016-11-07 MED ORDER — ACETAMINOPHEN 325 MG PO TABS: 650.0000 mg | ORAL_TABLET | ORAL | Status: DC | PRN

## 2016-11-07 MED ORDER — PHENOL 1.4 % MT LIQD: 1.0000 | OROMUCOSAL | Status: DC | PRN

## 2016-11-07 MED ORDER — SODIUM CHLORIDE 0.9 % IV SOLN: 250.0000 mL | INTRAVENOUS | Status: DC

## 2016-11-07 MED ORDER — TRAZODONE HCL 50 MG PO TABS
50.0000 mg | ORAL_TABLET | Freq: Every evening | ORAL | Status: DC | PRN
  Filled 2016-11-07: qty 1

## 2016-11-07 MED ORDER — EPHEDRINE SULFATE 50 MG/ML IJ SOLN
INTRAMUSCULAR | Status: DC | PRN
  Administered 2016-11-07: 10 mg via INTRAVENOUS

## 2016-11-07 MED ORDER — SODIUM CHLORIDE 0.9% FLUSH: 3.0000 mL | INTRAVENOUS | Status: DC | PRN

## 2016-11-07 MED ORDER — FUROSEMIDE 20 MG PO TABS: 20.0000 mg | ORAL_TABLET | Freq: Every day | ORAL | Status: DC

## 2016-11-07 MED ORDER — ACETAMINOPHEN 10 MG/ML IV SOLN
INTRAVENOUS | Status: DC | PRN
  Administered 2016-11-07: 1000 mg via INTRAVENOUS

## 2016-11-07 MED ORDER — LACTATED RINGERS IV SOLN
INTRAVENOUS | Status: DC | PRN
  Administered 2016-11-07: 07:00:00 via INTRAVENOUS

## 2016-11-07 MED ORDER — VITAMIN C 500 MG PO TABS
500.0000 mg | ORAL_TABLET | Freq: Every day | ORAL | Status: DC
  Filled 2016-11-07: qty 1

## 2016-11-07 MED ORDER — ALUM & MAG HYDROXIDE-SIMETH 200-200-20 MG/5ML PO SUSP: 30.0000 mL | Freq: Four times a day (QID) | ORAL | Status: DC | PRN

## 2016-11-07 MED ORDER — MAGNESIUM HYDROXIDE 400 MG/5ML PO SUSP: 30.0000 mL | Freq: Every day | ORAL | Status: DC | PRN

## 2016-11-07 MED ORDER — ROCURONIUM BROMIDE 100 MG/10ML IV SOLN
INTRAVENOUS | Status: DC | PRN
  Administered 2016-11-07: 50 mg via INTRAVENOUS
  Administered 2016-11-07: 20 mg via INTRAVENOUS

## 2016-11-07 MED ORDER — PHENYLEPHRINE HCL 10 MG/ML IJ SOLN
INTRAMUSCULAR | Status: DC | PRN
  Administered 2016-11-07 (×3): 80 ug via INTRAVENOUS

## 2016-11-07 MED ORDER — LIDOCAINE 2% (20 MG/ML) 5 ML SYRINGE
INTRAMUSCULAR | Status: AC
  Filled 2016-11-07: qty 5

## 2016-11-07 MED ORDER — PROPOFOL 10 MG/ML IV BOLUS
INTRAVENOUS | Status: AC
  Filled 2016-11-07: qty 20

## 2016-11-07 MED ORDER — PHENYLEPHRINE HCL 10 MG/ML IJ SOLN
INTRAMUSCULAR | Status: DC | PRN
  Administered 2016-11-07: 120 ug via INTRAVENOUS

## 2016-11-07 MED ORDER — CYCLOBENZAPRINE HCL 5 MG PO TABS
5.0000 mg | ORAL_TABLET | Freq: Three times a day (TID) | ORAL | Status: DC | PRN
  Administered 2016-11-07: 5 mg via ORAL
  Filled 2016-11-07: qty 2
  Filled 2016-11-07: qty 1

## 2016-11-07 MED ORDER — DEXAMETHASONE SODIUM PHOSPHATE 4 MG/ML IJ SOLN
INTRAMUSCULAR | Status: DC | PRN
  Administered 2016-11-07: 10 mg via INTRAVENOUS

## 2016-11-07 MED ORDER — LACTATED RINGERS IV SOLN
INTRAVENOUS | Status: DC | PRN
  Administered 2016-11-07: 11:00:00 via INTRAVENOUS

## 2016-11-07 MED ORDER — KETOROLAC TROMETHAMINE 30 MG/ML IJ SOLN
7.5000 mg | Freq: Once | INTRAMUSCULAR | Status: AC
Start: 2016-11-07 — End: 2016-11-07
  Administered 2016-11-07: 7.5 mg via INTRAVENOUS

## 2016-11-07 MED ORDER — HYDROCODONE-ACETAMINOPHEN 5-325 MG PO TABS
1.0000 | ORAL_TABLET | ORAL | Status: DC | PRN
  Administered 2016-11-07: 1 via ORAL
  Administered 2016-11-07 – 2016-11-08 (×2): 2 via ORAL
  Filled 2016-11-07 (×3): qty 2

## 2016-11-07 MED ORDER — HYDROXYZINE HCL 50 MG/ML IM SOLN: 50.0000 mg | INTRAMUSCULAR | Status: DC | PRN

## 2016-11-07 SURGICAL SUPPLY — 59 items
ADH SKN CLS APL DERMABOND .7 (GAUZE/BANDAGES/DRESSINGS) ×1
BAG DECANTER FOR FLEXI CONT (MISCELLANEOUS) ×2 IMPLANT
BIT DRILL 14X2.5XNS TI ANT (BIT) IMPLANT
BIT DRILL AVIATOR 14 (BIT) ×2
BIT DRILL NEURO 2X3.1 SFT TUCH (MISCELLANEOUS) ×1 IMPLANT
BIT DRL 14X2.5XNS TI ANT (BIT) ×1
BLADE ULTRA TIP 2M (BLADE) ×2 IMPLANT
CANISTER SUCT 3000ML PPV (MISCELLANEOUS) ×2 IMPLANT
CARTRIDGE OIL MAESTRO DRILL (MISCELLANEOUS) ×1 IMPLANT
COVER MAYO STAND STRL (DRAPES) ×2 IMPLANT
DECANTER SPIKE VIAL GLASS SM (MISCELLANEOUS) ×2 IMPLANT
DERMABOND ADVANCED (GAUZE/BANDAGES/DRESSINGS) ×1
DERMABOND ADVANCED .7 DNX12 (GAUZE/BANDAGES/DRESSINGS) ×1 IMPLANT
DIFFUSER DRILL AIR PNEUMATIC (MISCELLANEOUS) ×2 IMPLANT
DRAPE C-ARM 35X43 STRL (DRAPES) ×3 IMPLANT
DRAPE HALF SHEET 40X57 (DRAPES) ×1 IMPLANT
DRAPE LAPAROTOMY 100X72 PEDS (DRAPES) ×2 IMPLANT
DRAPE MICROSCOPE LEICA (MISCELLANEOUS) ×2 IMPLANT
DRAPE POUCH INSTRU U-SHP 10X18 (DRAPES) ×2 IMPLANT
DRILL NEURO 2X3.1 SOFT TOUCH (MISCELLANEOUS) ×2
ELECT COATED BLADE 2.86 ST (ELECTRODE) ×2 IMPLANT
ELECT REM PT RETURN 9FT ADLT (ELECTROSURGICAL) ×2
ELECTRODE REM PT RTRN 9FT ADLT (ELECTROSURGICAL) ×1 IMPLANT
GLOVE BIOGEL PI IND STRL 8 (GLOVE) ×1 IMPLANT
GLOVE BIOGEL PI INDICATOR 8 (GLOVE) ×1
GLOVE ECLIPSE 7.5 STRL STRAW (GLOVE) ×2 IMPLANT
GLOVE EXAM NITRILE LRG STRL (GLOVE) IMPLANT
GLOVE EXAM NITRILE XL STR (GLOVE) IMPLANT
GLOVE EXAM NITRILE XS STR PU (GLOVE) IMPLANT
GOWN STRL REUS W/ TWL LRG LVL3 (GOWN DISPOSABLE) IMPLANT
GOWN STRL REUS W/ TWL XL LVL3 (GOWN DISPOSABLE) IMPLANT
GOWN STRL REUS W/TWL 2XL LVL3 (GOWN DISPOSABLE) IMPLANT
GOWN STRL REUS W/TWL LRG LVL3 (GOWN DISPOSABLE) ×2
GOWN STRL REUS W/TWL XL LVL3 (GOWN DISPOSABLE) ×4
GRAFT CORT CANC 14X8.25X11 5D (Bone Implant) ×1 IMPLANT
HALTER HD/CHIN CERV TRACTION D (MISCELLANEOUS) ×2 IMPLANT
HEMOSTAT POWDER KIT SURGIFOAM (HEMOSTASIS) ×2 IMPLANT
KIT BASIN OR (CUSTOM PROCEDURE TRAY) ×2 IMPLANT
KIT ROOM TURNOVER OR (KITS) ×2 IMPLANT
NDL HYPO 25X1 1.5 SAFETY (NEEDLE) ×1 IMPLANT
NDL SPNL 22GX3.5 QUINCKE BK (NEEDLE) ×1 IMPLANT
NEEDLE HYPO 25X1 1.5 SAFETY (NEEDLE) ×2 IMPLANT
NEEDLE SPNL 22GX3.5 QUINCKE BK (NEEDLE) ×2 IMPLANT
NS IRRIG 1000ML POUR BTL (IV SOLUTION) ×2 IMPLANT
OIL CARTRIDGE MAESTRO DRILL (MISCELLANEOUS) ×2
PACK LAMINECTOMY NEURO (CUSTOM PROCEDURE TRAY) ×2 IMPLANT
PAD ARMBOARD 7.5X6 YLW CONV (MISCELLANEOUS) ×6 IMPLANT
PLATE AVIATOR ASSY 1LVL SZ 18 (Plate) ×1 IMPLANT
RUBBERBAND STERILE (MISCELLANEOUS) ×4 IMPLANT
SCREW AVIAT VAR SLFTAP 4.35X14 (Screw) ×1 IMPLANT
SCREW AVIATOR VAR SELFTAP 4X14 (Screw) ×3 IMPLANT
SPONGE INTESTINAL PEANUT (DISPOSABLE) ×2 IMPLANT
SPONGE SURGIFOAM ABS GEL SZ50 (HEMOSTASIS) ×2 IMPLANT
STAPLER SKIN PROX WIDE 3.9 (STAPLE) ×1 IMPLANT
SUT VIC AB 2-0 CP2 18 (SUTURE) ×2 IMPLANT
SUT VIC AB 3-0 SH 8-18 (SUTURE) ×2 IMPLANT
TOWEL GREEN STERILE (TOWEL DISPOSABLE) ×2 IMPLANT
TOWEL GREEN STERILE FF (TOWEL DISPOSABLE) ×1 IMPLANT
WATER STERILE IRR 1000ML POUR (IV SOLUTION) ×2 IMPLANT

## 2016-11-07 SURGICAL SUPPLY — 61 items
ADH SKN CLS APL DERMABOND .7 (GAUZE/BANDAGES/DRESSINGS) ×1
BAG DECANTER FOR FLEXI CONT (MISCELLANEOUS) ×2 IMPLANT
BLADE CLIPPER SURG (BLADE) IMPLANT
BUR ACRON 5.0MM COATED (BURR) ×1 IMPLANT
BUR MATCHSTICK NEURO 3.0 LAGG (BURR) ×1 IMPLANT
CANISTER SUCT 3000ML PPV (MISCELLANEOUS) ×2 IMPLANT
CARTRIDGE OIL MAESTRO DRILL (MISCELLANEOUS) ×1 IMPLANT
COVER BACK TABLE 60X90IN (DRAPES) ×1 IMPLANT
DERMABOND ADVANCED (GAUZE/BANDAGES/DRESSINGS) ×1
DERMABOND ADVANCED .7 DNX12 (GAUZE/BANDAGES/DRESSINGS) ×2 IMPLANT
DIFFUSER DRILL AIR PNEUMATIC (MISCELLANEOUS) ×2 IMPLANT
DRAPE C-ARM 42X72 X-RAY (DRAPES) ×2 IMPLANT
DRAPE HALF SHEET 40X57 (DRAPES) IMPLANT
DRAPE LAPAROTOMY 100X72X124 (DRAPES) ×2 IMPLANT
DRAPE POUCH INSTRU U-SHP 10X18 (DRAPES) ×2 IMPLANT
ELECT REM PT RETURN 9FT ADLT (ELECTROSURGICAL) ×2
ELECTRODE REM PT RTRN 9FT ADLT (ELECTROSURGICAL) ×1 IMPLANT
GAUZE SPONGE 4X4 12PLY STRL (GAUZE/BANDAGES/DRESSINGS) ×2 IMPLANT
GAUZE SPONGE 4X4 16PLY XRAY LF (GAUZE/BANDAGES/DRESSINGS) IMPLANT
GLOVE BIOGEL PI IND STRL 7.0 (GLOVE) IMPLANT
GLOVE BIOGEL PI IND STRL 7.5 (GLOVE) IMPLANT
GLOVE BIOGEL PI IND STRL 8 (GLOVE) ×2 IMPLANT
GLOVE BIOGEL PI INDICATOR 7.0 (GLOVE) ×1
GLOVE BIOGEL PI INDICATOR 7.5 (GLOVE) ×2
GLOVE BIOGEL PI INDICATOR 8 (GLOVE) ×1
GLOVE ECLIPSE 7.5 STRL STRAW (GLOVE) ×3 IMPLANT
GLOVE EXAM NITRILE LRG STRL (GLOVE) IMPLANT
GLOVE EXAM NITRILE XL STR (GLOVE) IMPLANT
GLOVE EXAM NITRILE XS STR PU (GLOVE) IMPLANT
GLOVE SURG SS PI 7.5 STRL IVOR (GLOVE) ×1 IMPLANT
GOWN STRL REUS W/ TWL LRG LVL3 (GOWN DISPOSABLE) ×1 IMPLANT
GOWN STRL REUS W/ TWL XL LVL3 (GOWN DISPOSABLE) ×1 IMPLANT
GOWN STRL REUS W/TWL 2XL LVL3 (GOWN DISPOSABLE) IMPLANT
GOWN STRL REUS W/TWL LRG LVL3 (GOWN DISPOSABLE) ×2
GOWN STRL REUS W/TWL XL LVL3 (GOWN DISPOSABLE) ×4
KIT BASIN OR (CUSTOM PROCEDURE TRAY) ×2 IMPLANT
KIT ROOM TURNOVER OR (KITS) ×2 IMPLANT
NDL SPNL 18GX3.5 QUINCKE PK (NEEDLE) IMPLANT
NDL SPNL 22GX3.5 QUINCKE BK (NEEDLE) ×2 IMPLANT
NEEDLE SPNL 18GX3.5 QUINCKE PK (NEEDLE) IMPLANT
NEEDLE SPNL 22GX3.5 QUINCKE BK (NEEDLE) IMPLANT
NS IRRIG 1000ML POUR BTL (IV SOLUTION) ×1 IMPLANT
OIL CARTRIDGE MAESTRO DRILL (MISCELLANEOUS) ×2
PACK LAMINECTOMY NEURO (CUSTOM PROCEDURE TRAY) ×2 IMPLANT
PAD ARMBOARD 7.5X6 YLW CONV (MISCELLANEOUS) ×6 IMPLANT
PATTIES SURGICAL .5 X.5 (GAUZE/BANDAGES/DRESSINGS) IMPLANT
PATTIES SURGICAL .5 X1 (DISPOSABLE) IMPLANT
PATTIES SURGICAL 1X1 (DISPOSABLE) IMPLANT
SPONGE LAP 4X18 X RAY DECT (DISPOSABLE) IMPLANT
SPONGE NEURO XRAY DETECT 1X3 (DISPOSABLE) IMPLANT
SPONGE SURGIFOAM ABS GEL 100 (HEMOSTASIS) ×1 IMPLANT
STAPLER PROXIMATE FIRING4 30MM (STAPLE) ×1 IMPLANT
SUT VIC AB 1 CT1 18XBRD ANBCTR (SUTURE) ×2 IMPLANT
SUT VIC AB 1 CT1 8-18 (SUTURE)
SUT VIC AB 2-0 CP2 18 (SUTURE) ×2 IMPLANT
SYR 3ML LL SCALE MARK (SYRINGE) ×4 IMPLANT
SYR CONTROL 10ML LL (SYRINGE) ×2 IMPLANT
TOWEL GREEN STERILE (TOWEL DISPOSABLE) ×2 IMPLANT
TOWEL GREEN STERILE FF (TOWEL DISPOSABLE) ×2 IMPLANT
TRAY FOLEY W/METER SILVER 16FR (SET/KITS/TRAYS/PACK) ×1 IMPLANT
WATER STERILE IRR 1000ML POUR (IV SOLUTION) ×1 IMPLANT

## 2016-11-07 NOTE — Op Note (Signed)
11/07/2016  11:27 AM  PATIENT:  Melissa Rosales  71 y.o. female  PRE-OPERATIVE DIAGNOSIS:  Status post anterior cervical discectomy and arthrodesis, with incompletely secured anterior instrumentation  POST-OPERATIVE DIAGNOSIS:  Status post anterior cervical discectomy and arthrodesis, with incompletely secured anterior instrumentation  PROCEDURE:  Procedure(s):  Revision of anterior cervical discectomy and arthrodesis  SURGEON:  Jovita Gamma, M.D.  ASSISTANTS: Cyndy Freeze, M.D.  ANESTHESIA:   general  EBL:  Less than 10 mL  BLOOD ADMINISTERED:none  COUNT: Correct per nursing staff  DICTATION: Patient had just undergone an anterior cervical discectomy and arthrodesis at the C4-5 level. She had been reversed from the anesthetic, extubated, and transferred to the recovery room. It was realized at that point that the patient's anterior cervical plate and screw system had not been locked, and therefore she was brought back to the operating room, placed under general endotracheal anesthesia, placed in 10 pounds of halter traction, and prepared for brief reoperation. The Dermabond was removed, and then the anterior neck was prepped with Betadine soap and solution. The field was draped in a sterile fashion. The previous incision was reopened, and we dissected down through the surgical plane to the anterior aspect of the cervical spine. The aviator plate was identified, and the locking system was found not to been secured. We went ahead and locked the plate and screws. The wound was then irrigated several times with bacitracin solution, hemostasis was confirmed. We then proceeded with closure. Platysma was approximately interrupted undyed 2-0 Vicryl sutures. Subcutaneous and subcuticular closed with interrupted inverted 3-0 Vicryl sutures. Skin edges were approximated Dermabond. The patient was then taken out of traction, to be reversed an anesthetic, extubated, and transferred to the recovery  room for further care.  PLAN OF CARE: Admit for overnight observation  PATIENT DISPOSITION:  PACU - hemodynamically stable.   Delay start of Pharmacological VTE agent (>24hrs) due to surgical blood loss or risk of bleeding:  yes

## 2016-11-07 NOTE — H&P (Signed)
Subjective: Patient is a 71 y.o. right-handed black female who is admitted for treatment of cervical myeloradiculopathy secondary to cervical stenosis due to broad-based spondylitic disc herniation as well as ligamentum flavum thickening. MRI reveals increased signal in the spinal cord at that level. She does have 5 levels of degenerative changes to the cervical spine, but the worst degeneration and most pronounced stenosis is at C4-5. Patient admitted for a C4-5 anterior cervical decompression arthrodesis with structural bone graft and cervical plating.   Patient Active Problem List   Diagnosis Date Noted  . Paresthesia and pain of both upper extremities 07/04/2016  . Pain in joint, lower leg 07/05/2011  . Muscle weakness (generalized) 07/05/2011  . Sciatica of right side 06/30/2011  . DDD (degenerative disc disease), lumbar 06/30/2011  . HYPERTENSION 06/19/2009  . GERD 06/19/2009  . CONSTIPATION 06/19/2009   Past Medical History:  Diagnosis Date  . Acid reflux   . Arthritis   . Hypertension     Past Surgical History:  Procedure Laterality Date  . COLONOSCOPY    . EYE SURGERY Bilateral   . VAGINAL HYSTERECTOMY    . vision problems since birth      Prescriptions Prior to Admission  Medication Sig Dispense Refill Last Dose  . amLODipine (NORVASC) 10 MG tablet Take 10 mg by mouth daily.    11/07/2016 at 0430  . aspirin EC 81 MG tablet Take 81 mg by mouth daily.   Past Week at Unknown time  . b complex vitamins tablet Take 1 tablet by mouth daily.   Past Week at Unknown time  . Calcium Carb-Cholecalciferol (CALCIUM + D3 PO) Take 1 tablet by mouth 2 (two) times daily.   Past Week at Unknown time  . clotrimazole-betamethasone (LOTRISONE) cream Apply 1 application topically 2 (two) times daily as needed (irritation).   Past Week at Unknown time  . docusate sodium (COLACE) 100 MG capsule Take 100 mg by mouth daily as needed for constipation.   11/06/2016 at Unknown time  . enalapril  (VASOTEC) 20 MG tablet Take 20 mg by mouth every evening. (1700)   Past Week at Unknown time  . furosemide (LASIX) 20 MG tablet Take 20 mg by mouth daily.   11/06/2016 at Unknown time  . gabapentin (NEURONTIN) 100 MG capsule Take 100 mg by mouth 3 (three) times daily.   11/07/2016 at 0430  . hydrochlorothiazide (HYDRODIURIL) 25 MG tablet Take 25 mg by mouth daily.   11/06/2016 at Unknown time  . lactulose (CHRONULAC) 10 GM/15ML solution Take 10-20 g by mouth at bedtime as needed (for constipation).   Past Week at Unknown time  . meloxicam (MOBIC) 15 MG tablet Take 15 mg by mouth daily.   Past Week at Unknown time  . Multiple Vitamins-Minerals (MULTIVITAMIN WITH MINERALS) tablet Take 1 tablet by mouth daily.   Past Week at Unknown time  . Omega 3 1000 MG CAPS Take 1 capsule by mouth daily.   Past Week at Unknown time  . ranitidine (ZANTAC) 150 MG tablet Take 150 mg by mouth daily.    11/07/2016 at 0430  . traZODone (DESYREL) 50 MG tablet Take 50 mg by mouth at bedtime as needed for sleep.   Past Week at Unknown time  . vitamin C (ASCORBIC ACID) 500 MG tablet Take 500 mg by mouth daily.   Past Week at Unknown time  . FIBER FORMULA CAPS Take 1 capsule by mouth daily.      Allergies  Allergen Reactions  .  Keflex [Cephalexin] Nausea And Vomiting    Social History  Substance Use Topics  . Smoking status: Never Smoker  . Smokeless tobacco: Never Used  . Alcohol use No    Family History  Problem Relation Age of Onset  . Arthritis Unknown   . Depression Sister      Review of Systems A comprehensive review of systems was negative.  Objective: Vital signs in last 24 hours: Temp:  [98.8 F (37.1 C)] 98.8 F (37.1 C) (06/18 0646) Pulse Rate:  [62] 62 (06/18 0646) Resp:  [20] 20 (06/18 0646) BP: (121)/(63) 121/63 (06/18 0646) SpO2:  [100 %] 100 % (06/18 0646) Weight:  [85.3 kg (188 lb)] 85.3 kg (188 lb) (06/18 0646)  EXAM: Patient well-developed well-nourished female in no acute distress.  Lungs are clear to auscultation , the patient has symmetrical respiratory excursion. Heart has a regular rate and rhythm normal S1 and S2 no murmur.   Abdomen is soft nontender nondistended bowel sounds are present. Extremity examination shows no clubbing cyanosis or edema. Neurologic examination shows 5/5 strength in the upper extremities including the deltoid, biceps, triceps, intrinsics, and grip, as well as the iliopsoas in the lower extremities bilaterally. However the intensity of the right grip intrinsics somewhat less than the intensity of the left grip and intrinsics. Sensation is intact to pinprick. Reflexes examination shows the left biceps and brachioradialis are 1-2, the right biceps and brachioradialis are 2-3. Triceps are 2-3 bilaterally. Quadriceps are 3-4 bilaterally. Left gastrocnemius is trace. Right gastrocnemius is 1-2. Left toe is more clearly upgoing, right toe is equivocal to upgoing.  Data Review:CBC    Component Value Date/Time   WBC 8.1 10/28/2016 1428   RBC 4.47 10/28/2016 1428   HGB 12.2 10/28/2016 1428   HCT 38.2 10/28/2016 1428   PLT 185 10/28/2016 1428   MCV 85.5 10/28/2016 1428   MCH 27.3 10/28/2016 1428   MCHC 31.9 10/28/2016 1428   RDW 14.8 10/28/2016 1428   LYMPHSABS 1.5 08/26/2012 0955   MONOABS 0.6 08/26/2012 0955   EOSABS 0.0 08/26/2012 0955   BASOSABS 0.0 08/26/2012 0955                          BMET    Component Value Date/Time   NA 141 11/07/2016 0629   K 3.3 (L) 11/07/2016 0629   CL 105 11/07/2016 0629   CO2 26 11/07/2016 0629   GLUCOSE 91 11/07/2016 0629   BUN 18 11/07/2016 0629   CREATININE 0.99 11/07/2016 0629   CALCIUM 9.5 11/07/2016 0629   GFRNONAA 56 (L) 11/07/2016 0629   GFRAA >60 11/07/2016 6222     Assessment/Plan: Patient presenting with slowly progressive cervical myeloradiculopathy that has developed over the past several years. Most pronounced stenosis is at the C4-5 level, with there is increased signal the spinal cord.  Decision is made to proceed with C4-5 anterior cervical decompression and arthrodesis with structural allograft and cervical plating. We have discussed the possibility that she may require posterior cervical decompression and arthrodesis.  I've discussed with the patient the nature of his condition, the nature the surgical procedure, the typical length of surgery, hospital stay, and overall recuperation. We discussed limitations postoperatively. I discussed risks of surgery including risks of infection, bleeding, possibly need for transfusion, the risk of nerve root dysfunction with pain, weakness, numbness, or paresthesias, the risk of spinal cord dysfunction with paralysis of all 4 limbs and quadriplegia, and the risk of dural  tear and CSF leakage and possible need for further surgery, the risk of esophageal dysfunction causing dysphagia and the risk of laryngeal dysfunction causing hoarseness of the voice, the risk of failure of the arthrodesis and the possible need for further surgery, and the risk of anesthetic complications including myocardial infarction, stroke, pneumonia, and death. We also discussed the need for postoperative immobilization in a cervical collar. Understanding all this the patient does wish to proceed with surgery and is admitted for such.    Hosie Spangle, MD 11/07/2016 7:19 AM

## 2016-11-07 NOTE — Anesthesia Postprocedure Evaluation (Signed)
Anesthesia Post Note  Patient: Meredyth E Naeem  Procedure(s) Performed: Procedure(s) (LRB): ANTERIOR CERVICAL DECOMPRESSION/DISCECTOMY FUSION CERVICAL 4- CERVICAL 5 (N/A)     Patient location during evaluation: PACU Anesthesia Type: General Level of consciousness: awake and alert Pain management: pain level controlled Vital Signs Assessment: post-procedure vital signs reviewed and stable Respiratory status: spontaneous breathing, nonlabored ventilation and respiratory function stable Cardiovascular status: blood pressure returned to baseline and stable Postop Assessment: no signs of nausea or vomiting Anesthetic complications: no    Last Vitals:  Vitals:   11/07/16 1202 11/07/16 1314  BP: 115/63 132/78  Pulse: 77 80  Resp: 13 18  Temp:  36.7 C    Last Pain:  Vitals:   11/07/16 1147  TempSrc:   PainSc: Asleep                 Daryl Quiros,W. EDMOND

## 2016-11-07 NOTE — Progress Notes (Signed)
Vitals:   11/07/16 1147 11/07/16 1202 11/07/16 1314 11/07/16 1628  BP: 125/68 115/63 132/78 127/70  Pulse: 76 77 80 77  Resp: 18 13 18 18   Temp:   98 F (36.7 C) 97.6 F (36.4 C)  TempSrc:      SpO2: 96% 95% 95% 100%  Weight:       BMET  Recent Labs  11/07/16 0629  NA 141  K 3.3*  CL 105  CO2 26  GLUCOSE 91  BUN 18  CREATININE 0.99  CALCIUM 9.5    Patient resting in bed, comfortable. Has ambulated in the halls, using a rolling walker, with the staff. Voiding well. Incision clean and dry; no swelling, erythema, or drainage.  Plan: Encouraged to ambulate. Continue to progress through postoperative recovery.  Hosie Spangle, MD 11/07/2016, 6:42 PM

## 2016-11-07 NOTE — Transfer of Care (Signed)
Immediate Anesthesia Transfer of Care Note  Patient: Melissa Rosales  Procedure(s) Performed: Procedure(s) with comments: ANTERIOR CERVICAL DECOMPRESSION/DISCECTOMY FUSION CERVICAL 4- CERVICAL 5 (N/A) - ANTERIOR CERVICAL DECOMPRESSION/DISCECTOMY FUSION CERVICAL 4- CERVICAL 5  Patient Location: PACU  Anesthesia Type:General  Level of Consciousness: awake and alert   Airway & Oxygen Therapy: Patient Spontanous Breathing and Patient connected to nasal cannula oxygen  Post-op Assessment: Report given to RN and Post -op Vital signs reviewed and stable  Post vital signs: Reviewed and stable  Last Vitals:  Vitals:   11/07/16 0646 11/07/16 1020  BP: 121/63 112/84  Pulse: 62 77  Resp: 20 18  Temp: 37.1 C 36.8 C    Last Pain:  Vitals:   11/07/16 1020  TempSrc:   PainSc: Asleep         Complications: No apparent anesthesia complications

## 2016-11-07 NOTE — Op Note (Signed)
11/07/2016  10:04 AM  PATIENT:  Melissa Rosales  71 y.o. female  PRE-OPERATIVE DIAGNOSIS:  C4-5 cervical disc herniation with myeloradiculopathy, cervical spondylosis, cervical degenerative disease  POST-OPERATIVE DIAGNOSIS:  C4-5 cervical disc herniation with myeloradiculopathy, cervical spondylosis, cervical degenerative disease  PROCEDURE:  Procedure(s):  ANTERIOR CERVICAL DECOMPRESSION/ARTHRODESIS FUSION CERVICAL 4- CERVICAL 5  SURGEON:  Jovita Gamma, M.D.  ASSISTANTS: Cyndy Freeze, M.D.  ANESTHESIA:   general  EBL:  50 cc  BLOOD ADMINISTERED:none  COUNT: Correct per nursing staff  DICTATION: Patient was brought to the operating room placed under general endotracheal anesthesia. Patient was placed in 10 pounds of halter traction. The neck was prepped with Betadine soap and solution and draped in a sterile fashion. A horizontal incision was made on the left side of the neck. The line of the incision was infiltrated with local anesthetic with epinephrine. Dissection was carried down thru the subcutaneous tissue and platysma, bipolar cautery was used to maintain hemostasis. Dissection was then carried down thru an avascular plane leaving the sternocleidomastoid carotid artery and jugular vein laterally and the trachea and esophagus medially. The ventral aspect of the vertebral column was identified and a localizing x-ray was taken. The C4-5 level was identified. The annulus was incised and the disc space entered. Discectomy was performed with micro-curettes and pituitary rongeurs. The operating microscope was draped and brought into the field provided additional magnification illumination and visualization. Discectomy was continued posteriorly thru the disc space and then the cartilaginous endplate was removed using micro-curettes along with the high-speed drill. Posterior osteophytic overgrowth was removed using the high-speed drill along with a 2 mm thin footplated Kerrison punch.  Posterior longitudinal ligament along with disc herniation was carefully removed, decompressing the spinal canal and thecal sac. We then continued to remove osteophytic overgrowth and disc material decompressing the neural foramina and exiting nerve roots bilaterally. Once the decompression was completed hemostasis was established with the use of Gelfoam with thrombin and bipolar cautery. The Gelfoam was removed, a thin layer of Surgifoam applied, the wound irrigated and hemostasis confirmed. We then measured the height of the intravertebral disc space and selected a 8 millimeter in height structural allograft. It was hydrated and saline solution and then gently positioned in the intravertebral disc space and countersunk. We then selected a 18 millimeter in height Aviator cervical plate. It was positioned over the fusion construct and secured to the vertebra with 4 x 14 mm screw on the right at the C4 level, 4.35 x 14 mm screw on the left at the C4 level, and 4 x 14 mm screws at the C5 level. Each screw hole was hand drilled and then the screws placed. The wound was irrigated with bacitracin solution checked for hemostasis which was established and confirmed. X-ray and C-arm were used and showed the grafts in good position, the plate and screws in good position, and the overall construct looked good. We then proceeded with closure. The platysma was closed with interrupted inverted 2-0 undyed Vicryl suture, the subcutaneous and subcuticular closed with interrupted inverted 3-0 undyed Vicryl suture. The skin edges were approximated with Dermabond. Following surgery the patient was taken out of cervical traction, reversed an anesthetic, extubated, transferred to the recovery room, where she was noted to be moving all 4 extremities to command.  However as this dictation was prepared, it was realized that the locking system was not secured, and therefore the patient is going to be returned to the operating room to lock  the anterior  cervical plate and screws.

## 2016-11-07 NOTE — Progress Notes (Signed)
Vitals:   11/07/16 1025 11/07/16 1146 11/07/16 1147 11/07/16 1202  BP:  125/68 125/68 115/63  Pulse: 77 77 76 77  Resp: 15 18 18 13   Temp:  (!) 96.8 F (36 C)    TempSrc:      SpO2: 98% 95% 96% 95%  Weight:       BMET  Recent Labs  11/07/16 0629  NA 141  K 3.3*  CL 105  CO2 26  GLUCOSE 91  BUN 18  CREATININE 0.99  CALCIUM 9.5    Patient examined several times in PACU. Has steadily become more awake and alert. Now following commands briskly with all 4 extremities. Good movement of all 4 extremities. Incision clean and dry. Immobilized in a soft cervical collar.  Plan: For transfer soon to [place] unit.  Hosie Spangle, MD 11/07/2016, 12:42 PM

## 2016-11-07 NOTE — Transfer of Care (Signed)
Immediate Anesthesia Transfer of Care Note  Patient: Melissa Rosales  Procedure(s) Performed: Procedure(s): anterior cervical disectomy fusion cervical four-five (revision) (N/A)  Patient Location: PACU  Anesthesia Type:General  Level of Consciousness: awake and alert   Airway & Oxygen Therapy: Patient Spontanous Breathing and Patient connected to nasal cannula oxygen  Post-op Assessment: Report given to RN and Post -op Vital signs reviewed and stable  Post vital signs: Reviewed and stable  Last Vitals:  Vitals:   11/07/16 1025 11/07/16 1146  BP:  125/68  Pulse: 77 77  Resp: 15 18  Temp:  (!) 36 C    Last Pain:  Vitals:   11/07/16 1147  TempSrc:   PainSc: (P) Asleep         Complications: No apparent anesthesia complications

## 2016-11-07 NOTE — Anesthesia Postprocedure Evaluation (Signed)
Anesthesia Post Note  Patient: Clarity E Edwards  Procedure(s) Performed: Procedure(s) (LRB): anterior cervical disectomy fusion cervical four-five (revision) (N/A)     Patient location during evaluation: PACU Anesthesia Type: General Level of consciousness: awake and alert Pain management: pain level controlled Vital Signs Assessment: post-procedure vital signs reviewed and stable Respiratory status: spontaneous breathing, nonlabored ventilation and respiratory function stable Cardiovascular status: blood pressure returned to baseline and stable Postop Assessment: no signs of nausea or vomiting Anesthetic complications: no    Last Vitals:  Vitals:   11/07/16 1202 11/07/16 1314  BP: 115/63 132/78  Pulse: 77 80  Resp: 13 18  Temp:  36.7 C    Last Pain:  Vitals:   11/07/16 1147  TempSrc:   PainSc: Asleep                 Whitaker Holderman,W. EDMOND

## 2016-11-07 NOTE — Anesthesia Procedure Notes (Signed)
Procedure Name: Intubation Date/Time: 11/07/2016 7:40 AM Performed by: Ollen Bowl Pre-anesthesia Checklist: Patient identified, Emergency Drugs available, Suction available, Patient being monitored and Timeout performed Patient Re-evaluated:Patient Re-evaluated prior to inductionOxygen Delivery Method: Simple face mask and Circle system utilized Preoxygenation: Pre-oxygenation with 100% oxygen Intubation Type: IV induction Ventilation: Mask ventilation without difficulty Laryngoscope Size: Glidescope and 3 Grade View: Grade I Tube type: Oral Tube size: 7.5 mm Number of attempts: 1 Airway Equipment and Method: Patient positioned with wedge pillow,  Stylet and Video-laryngoscopy Placement Confirmation: ETT inserted through vocal cords under direct vision,  positive ETCO2 and breath sounds checked- equal and bilateral Secured at: 22 cm Tube secured with: Tape Dental Injury: Teeth and Oropharynx as per pre-operative assessment  Comments: Elective glidescope due to limited mobility

## 2016-11-07 NOTE — Anesthesia Preprocedure Evaluation (Addendum)
Anesthesia Evaluation  Patient identified by MRN, date of birth, ID band Patient awake    Reviewed: Allergy & Precautions, H&P , NPO status , Patient's Chart, lab work & pertinent test results  Airway Mallampati: III  TM Distance: >3 FB Neck ROM: Limited    Dental no notable dental hx. (+) Teeth Intact, Dental Advisory Given   Pulmonary neg pulmonary ROS,    Pulmonary exam normal breath sounds clear to auscultation       Cardiovascular hypertension, Pt. on medications negative cardio ROS   Rhythm:Regular Rate:Normal     Neuro/Psych negative neurological ROS  negative psych ROS   GI/Hepatic Neg liver ROS, GERD  Medicated and Controlled,  Endo/Other  negative endocrine ROS  Renal/GU negative Renal ROS  negative genitourinary   Musculoskeletal   Abdominal   Peds  Hematology negative hematology ROS (+)   Anesthesia Other Findings   Reproductive/Obstetrics negative OB ROS                            Anesthesia Physical Anesthesia Plan  ASA: II  Anesthesia Plan: General   Post-op Pain Management:    Induction: Intravenous  PONV Risk Score and Plan: 4 or greater and Treatment may vary due to age or medical condition and Propofol  Airway Management Planned: Oral ETT  Additional Equipment:   Intra-op Plan:   Post-operative Plan: Extubation in OR  Informed Consent: I have reviewed the patients History and Physical, chart, labs and discussed the procedure including the risks, benefits and alternatives for the proposed anesthesia with the patient or authorized representative who has indicated his/her understanding and acceptance.   Dental advisory given  Plan Discussed with: CRNA  Anesthesia Plan Comments:         Anesthesia Quick Evaluation

## 2016-11-07 NOTE — Anesthesia Procedure Notes (Signed)
Procedure Name: Intubation Date/Time: 11/07/2016 10:37 AM Performed by: Ollen Bowl Pre-anesthesia Checklist: Patient identified, Emergency Drugs available, Suction available, Patient being monitored and Timeout performed Patient Re-evaluated:Patient Re-evaluated prior to inductionOxygen Delivery Method: Circle system utilized and Simple face mask Preoxygenation: Pre-oxygenation with 100% oxygen Intubation Type: IV induction Ventilation: Mask ventilation without difficulty Laryngoscope Size: Glidescope and 3 Grade View: Grade I Tube type: Oral Tube size: 7.0 mm Number of attempts: 1 Airway Equipment and Method: Patient positioned with wedge pillow,  Stylet and Video-laryngoscopy Placement Confirmation: ETT inserted through vocal cords under direct vision,  positive ETCO2 and breath sounds checked- equal and bilateral Secured at: 22 cm Tube secured with: Tape Dental Injury: Teeth and Oropharynx as per pre-operative assessment

## 2016-11-08 ENCOUNTER — Encounter (HOSPITAL_COMMUNITY): Payer: Self-pay | Admitting: Neurosurgery

## 2016-11-08 DIAGNOSIS — M50021 Cervical disc disorder at C4-C5 level with myelopathy: Secondary | ICD-10-CM | POA: Diagnosis not present

## 2016-11-08 MED ORDER — HYDROCODONE-ACETAMINOPHEN 5-325 MG PO TABS: 1.0000 | ORAL_TABLET | ORAL | 0 refills | Status: DC | PRN

## 2016-11-08 NOTE — Progress Notes (Signed)
Patient alert and oriented, mae's well, voiding adequate amount of urine, swallowing without difficulty, no c/o pain at time of discharge. Patient discharged home with family. Script and discharged instructions given to patient. Patient and family stated understanding of instructions given. Patient has an appointment with Dr. Nudelman 

## 2016-11-08 NOTE — Discharge Summary (Signed)
Physician Discharge Summary  Patient ID: Melissa Rosales MRN: [id number] DOB/AGE: 70-11-1945 70 y.o.  Admit date: 11/07/2016 Discharge date: 11/08/2016  Admission Diagnoses:  C4-5 cervical disc herniation with myeloradiculopathy, cervical spondylosis, cervical degenerative disease  Discharge Diagnoses:  C4-5 cervical disc herniation with myeloradiculopathy, cervical spondylosis, cervical degenerative disease Active Problems:   HNP (herniated nucleus pulposus) with myelopathy, cervical   Discharged Condition: good  Hospital Course: She was admitted, underwent a C4-5 ACDF. Postoperatively she has done well. She is up and ambulating actively in the halls. Her incision is healing nicely. There is no swelling, erythema, ecchymosis, or drainage. She is being discharged to home with instructions regarding wound care and activities. She is scheduled to follow-up with me in 3 weeks.  Discharge Exam: Blood pressure 121/77, pulse 60, temperature 98.7 F (37.1 C), temperature source Oral, resp. rate 18, weight 85.3 kg (188 lb), SpO2 98 %.  Disposition: 01-Home or Self Care  Discharge Instructions    Discharge wound care:    Complete by:  As directed    Leave the wound open to air. Shower daily with the wound uncovered. Water and soapy water should run over the incision area. Do not wash directly on the incision for 2 weeks. Remove the glue after 2 weeks.   Driving Restrictions    Complete by:  As directed    No driving for 2 weeks. May ride in the car locally now. May begin to drive locally in 2 weeks.   Other Restrictions    Complete by:  As directed    Walk gradually increasing distances out in the fresh air at least twice a day. Walking additional 6 times inside the house, gradually increasing distances, daily. No bending, lifting, or twisting. Perform activities between shoulder and waist height (that is at counter height when standing or table height when sitting).     Allergies as of  11/08/2016      Reactions   Keflex [cephalexin] Nausea And Vomiting      Medication List    TAKE these medications   amLODipine 10 MG tablet Commonly known as:  NORVASC Take 10 mg by mouth daily.   aspirin EC 81 MG tablet Take 81 mg by mouth daily.   b complex vitamins tablet Take 1 tablet by mouth daily.   CALCIUM + D3 PO Take 1 tablet by mouth 2 (two) times daily.   clotrimazole-betamethasone cream Commonly known as:  LOTRISONE Apply 1 application topically 2 (two) times daily as needed (irritation).   docusate sodium 100 MG capsule Commonly known as:  COLACE Take 100 mg by mouth daily as needed for constipation.   enalapril 20 MG tablet Commonly known as:  VASOTEC Take 20 mg by mouth every evening. (1700)   FIBER FORMULA Caps Take 1 capsule by mouth daily.   furosemide 20 MG tablet Commonly known as:  LASIX Take 20 mg by mouth daily.   gabapentin 100 MG capsule Commonly known as:  NEURONTIN Take 100 mg by mouth 3 (three) times daily.   hydrochlorothiazide 25 MG tablet Commonly known as:  HYDRODIURIL Take 25 mg by mouth daily.   HYDROcodone-acetaminophen 5-325 MG tablet Commonly known as:  NORCO/VICODIN Take 1-2 tablets by mouth every 4 (four) hours as needed for moderate pain.   lactulose 10 GM/15ML solution Commonly known as:  CHRONULAC Take 10-20 g by mouth at bedtime as needed (for constipation).   meloxicam 15 MG tablet Commonly known as:  MOBIC Take 15 mg by mouth daily.  multivitamin with minerals tablet Take 1 tablet by mouth daily.   Omega 3 1000 MG Caps Take 1 capsule by mouth daily.   ranitidine 150 MG tablet Commonly known as:  ZANTAC Take 150 mg by mouth daily.   traZODone 50 MG tablet Commonly known as:  DESYREL Take 50 mg by mouth at bedtime as needed for sleep.   vitamin C 500 MG tablet Commonly known as:  ASCORBIC ACID Take 500 mg by mouth daily.        SignedHosie Spangle 11/08/2016, 8:41 AM

## 2016-11-08 NOTE — Discharge Instructions (Signed)

## 2016-11-24 ENCOUNTER — Ambulatory Visit: Payer: Self-pay | Admitting: Neurology

## 2016-11-29 DIAGNOSIS — M4012 Other secondary kyphosis, cervical region: Secondary | ICD-10-CM | POA: Diagnosis not present

## 2016-11-29 DIAGNOSIS — M5000 Cervical disc disorder with myelopathy, unspecified cervical region: Secondary | ICD-10-CM | POA: Diagnosis not present

## 2016-11-30 ENCOUNTER — Ambulatory Visit: Payer: Self-pay | Admitting: Neurology

## 2017-01-10 DIAGNOSIS — L602 Onychogryphosis: Secondary | ICD-10-CM | POA: Diagnosis not present

## 2017-01-10 DIAGNOSIS — L84 Corns and callosities: Secondary | ICD-10-CM | POA: Diagnosis not present

## 2017-01-10 DIAGNOSIS — E1351 Other specified diabetes mellitus with diabetic peripheral angiopathy without gangrene: Secondary | ICD-10-CM | POA: Diagnosis not present

## 2017-01-18 DIAGNOSIS — G5791 Unspecified mononeuropathy of right lower limb: Secondary | ICD-10-CM | POA: Diagnosis not present

## 2017-01-18 DIAGNOSIS — M1711 Unilateral primary osteoarthritis, right knee: Secondary | ICD-10-CM | POA: Diagnosis not present

## 2017-01-31 DIAGNOSIS — Z981 Arthrodesis status: Secondary | ICD-10-CM | POA: Diagnosis not present

## 2017-02-10 ENCOUNTER — Other Ambulatory Visit (HOSPITAL_COMMUNITY): Payer: Self-pay | Admitting: Family Medicine

## 2017-02-10 DIAGNOSIS — Z09 Encounter for follow-up examination after completed treatment for conditions other than malignant neoplasm: Secondary | ICD-10-CM

## 2017-02-10 DIAGNOSIS — R921 Mammographic calcification found on diagnostic imaging of breast: Secondary | ICD-10-CM

## 2017-02-21 ENCOUNTER — Encounter (HOSPITAL_COMMUNITY): Payer: Self-pay

## 2017-02-28 ENCOUNTER — Ambulatory Visit (HOSPITAL_COMMUNITY)
Admission: RE | Admit: 2017-02-28 | Discharge: 2017-02-28 | Disposition: A | Discharge status: Home / Self Care | Payer: Medicare Other | Source: Ambulatory Visit | Attending: Family Medicine | Admitting: Family Medicine

## 2017-02-28 DIAGNOSIS — Z09 Encounter for follow-up examination after completed treatment for conditions other than malignant neoplasm: Secondary | ICD-10-CM | POA: Diagnosis not present

## 2017-02-28 DIAGNOSIS — R928 Other abnormal and inconclusive findings on diagnostic imaging of breast: Secondary | ICD-10-CM | POA: Diagnosis not present

## 2017-02-28 DIAGNOSIS — R921 Mammographic calcification found on diagnostic imaging of breast: Secondary | ICD-10-CM | POA: Diagnosis not present

## 2017-03-21 DIAGNOSIS — Z23 Encounter for immunization: Secondary | ICD-10-CM | POA: Diagnosis not present

## 2017-04-11 DIAGNOSIS — L602 Onychogryphosis: Secondary | ICD-10-CM | POA: Diagnosis not present

## 2017-04-11 DIAGNOSIS — E1351 Other specified diabetes mellitus with diabetic peripheral angiopathy without gangrene: Secondary | ICD-10-CM | POA: Diagnosis not present

## 2017-04-11 DIAGNOSIS — L84 Corns and callosities: Secondary | ICD-10-CM | POA: Diagnosis not present

## 2017-05-13 ENCOUNTER — Other Ambulatory Visit: Payer: Self-pay

## 2017-05-13 ENCOUNTER — Emergency Department (HOSPITAL_COMMUNITY): Payer: Medicare Other

## 2017-05-13 ENCOUNTER — Emergency Department (HOSPITAL_COMMUNITY)
Admission: EM | Admit: 2017-05-13 | Discharge: 2017-05-14 | Disposition: A | Discharge status: Home / Self Care | Payer: Medicare Other | Attending: Emergency Medicine | Admitting: Emergency Medicine

## 2017-05-13 ENCOUNTER — Encounter (HOSPITAL_COMMUNITY): Payer: Self-pay | Admitting: *Deleted

## 2017-05-13 DIAGNOSIS — I10 Essential (primary) hypertension: Secondary | ICD-10-CM | POA: Insufficient documentation

## 2017-05-13 DIAGNOSIS — Z79899 Other long term (current) drug therapy: Secondary | ICD-10-CM | POA: Insufficient documentation

## 2017-05-13 DIAGNOSIS — Y999 Unspecified external cause status: Secondary | ICD-10-CM | POA: Diagnosis not present

## 2017-05-13 DIAGNOSIS — Y939 Activity, unspecified: Secondary | ICD-10-CM | POA: Insufficient documentation

## 2017-05-13 DIAGNOSIS — S8254XA Nondisplaced fracture of medial malleolus of right tibia, initial encounter for closed fracture: Secondary | ICD-10-CM | POA: Diagnosis not present

## 2017-05-13 DIAGNOSIS — Y92003 Bedroom of unspecified non-institutional (private) residence as the place of occurrence of the external cause: Secondary | ICD-10-CM | POA: Insufficient documentation

## 2017-05-13 DIAGNOSIS — M25571 Pain in right ankle and joints of right foot: Secondary | ICD-10-CM | POA: Diagnosis not present

## 2017-05-13 DIAGNOSIS — X509XXA Other and unspecified overexertion or strenuous movements or postures, initial encounter: Secondary | ICD-10-CM | POA: Diagnosis not present

## 2017-05-13 DIAGNOSIS — S99911A Unspecified injury of right ankle, initial encounter: Secondary | ICD-10-CM | POA: Diagnosis present

## 2017-05-13 DIAGNOSIS — S8251XA Displaced fracture of medial malleolus of right tibia, initial encounter for closed fracture: Secondary | ICD-10-CM | POA: Diagnosis not present

## 2017-05-13 NOTE — ED Triage Notes (Signed)
Pt states she got up & felt like right ankle gave out & now its hurting.

## 2017-05-13 NOTE — ED Notes (Signed)
Pt transported to xray 

## 2017-05-14 DIAGNOSIS — S8254XA Nondisplaced fracture of medial malleolus of right tibia, initial encounter for closed fracture: Secondary | ICD-10-CM | POA: Diagnosis not present

## 2017-05-14 DIAGNOSIS — S8251XA Displaced fracture of medial malleolus of right tibia, initial encounter for closed fracture: Secondary | ICD-10-CM | POA: Diagnosis not present

## 2017-05-14 MED ORDER — TRAMADOL HCL 50 MG PO TABS: 50.0000 mg | ORAL_TABLET | Freq: Four times a day (QID) | ORAL | 0 refills | Status: DC | PRN

## 2017-05-14 MED ORDER — TRAMADOL HCL 50 MG PO TABS
50.0000 mg | ORAL_TABLET | Freq: Once | ORAL | Status: AC
  Administered 2017-05-14: 50 mg via ORAL
  Filled 2017-05-14: qty 1

## 2017-05-14 MED ORDER — NAPROXEN 375 MG PO TABS: 375.0000 mg | ORAL_TABLET | Freq: Two times a day (BID) | ORAL | 0 refills | Status: DC

## 2017-05-14 MED ORDER — IBUPROFEN 800 MG PO TABS
800.0000 mg | ORAL_TABLET | Freq: Once | ORAL | Status: AC
  Administered 2017-05-14: 800 mg via ORAL
  Filled 2017-05-14: qty 1

## 2017-05-14 NOTE — ED Provider Notes (Signed)
Patient presented to the ER with right ankle injury.  Face to face Exam: HEENT - PERRLA Lungs - CTAB Heart - RRR, no M/R/G Abd - S/NT/ND Neuro - alert, oriented x3 Musculoskeletal -tenderness medial aspect right ankle with swelling  Plan: Patient has medial malleolus fracture.  Fracture line is below the mortise. She is a fall risk so will be placed in a Cam walker and have minimal weightbearing, follow-up with orthopedics.   Orpah Greek, MD 05/14/17 [phone]

## 2017-05-14 NOTE — ED Provider Notes (Signed)
[facility] EMERGENCY DEPARTMENT Provider Note   CSN: [id number] Arrival date & time: 05/13/17  2229     History   Chief Complaint Chief Complaint  Patient presents with  . Ankle Pain    HPI Melissa Rosales is a 71 y.o. female with a history of hypertension, arthritis, GERD and paresthesias of extremities presenting with right ankle pain and swelling from an injury incurred just prior to arrival.  She reports she was standing on her bed when she twisted her ankle and has had pain and swelling since the event.  She has been able to weight-bear but with discomfort.  She denies pain in her knee or upper leg.  She has had no treatment prior to arrival.  The history is provided by the patient.    Past Medical History:  Diagnosis Date  . Acid reflux   . Arthritis   . Hypertension     Patient Active Problem List   Diagnosis Date Noted  . HNP (herniated nucleus pulposus) with myelopathy, cervical 11/07/2016  . Paresthesia and pain of both upper extremities 07/04/2016  . Pain in joint, lower leg 07/05/2011  . Muscle weakness (generalized) 07/05/2011  . Sciatica of right side 06/30/2011  . DDD (degenerative disc disease), lumbar 06/30/2011  . HYPERTENSION 06/19/2009  . GERD 06/19/2009  . CONSTIPATION 06/19/2009    Past Surgical History:  Procedure Laterality Date  . ANTERIOR CERVICAL DECOMP/DISCECTOMY FUSION N/A 11/07/2016   Procedure: ANTERIOR CERVICAL DECOMPRESSION/DISCECTOMY FUSION CERVICAL 4- CERVICAL 5;  Surgeon: Jovita Gamma, MD;  Location: [place];  Service: Neurosurgery;  Laterality: N/A;  ANTERIOR CERVICAL DECOMPRESSION/DISCECTOMY FUSION CERVICAL 4- CERVICAL 5  . COLONOSCOPY    . EYE SURGERY Bilateral   . HARDWARE REMOVAL N/A 11/07/2016   Procedure: anterior cervical disectomy fusion cervical four-five (revision);  Surgeon: Jovita Gamma, MD;  Location: [place];  Service: Neurosurgery;  Laterality: N/A;  . VAGINAL HYSTERECTOMY    . vision problems since birth       OB History    No data available       Home Medications    Prior to Admission medications   Medication Sig Start Date End Date Taking? Authorizing Provider  amLODipine (NORVASC) 10 MG tablet Take 10 mg by mouth daily.    Yes [provider]  aspirin EC 81 MG tablet Take 81 mg by mouth daily.   Yes [provider]  b complex vitamins tablet Take 1 tablet by mouth daily.   Yes [provider]  Calcium Carb-Cholecalciferol (CALCIUM + D3 PO) Take 1 tablet by mouth 2 (two) times daily.   Yes [provider]  clotrimazole-betamethasone (LOTRISONE) cream Apply 1 application topically 2 (two) times daily as needed (irritation).   Yes [provider]  docusate sodium (COLACE) 100 MG capsule Take 100 mg by mouth daily.    Yes [provider]  FIBER FORMULA CAPS Take 1 capsule by mouth daily.   Yes [provider]  furosemide (LASIX) 20 MG tablet Take 20 mg by mouth daily. 09/02/16  Yes [provider]  gabapentin (NEURONTIN) 100 MG capsule Take 100 mg by mouth 2 (two) times daily.  09/13/16  Yes [provider]  hydrochlorothiazide (HYDRODIURIL) 25 MG tablet Take 25 mg by mouth daily.   Yes [provider]  lactulose (CHRONULAC) 10 GM/15ML solution Take 10-20 g by mouth at bedtime as needed (for constipation).   Yes [provider]  Multiple Vitamins-Minerals (MULTIVITAMIN WITH MINERALS) tablet Take 1 tablet  by mouth daily.   Yes [provider]  Omega 3 1000 MG CAPS Take 1 capsule by mouth daily.   Yes [provider]  potassium chloride (K-DUR) 10 MEQ tablet Take 10 mEq by mouth daily. 05/05/17  Yes [provider]  ranitidine (ZANTAC) 150 MG tablet Take 150 mg by mouth daily.    Yes [provider]  vitamin C (ASCORBIC ACID) 500 MG tablet Take 500 mg by mouth daily.   Yes [provider]  enalapril (VASOTEC) 20 MG tablet Take 20 mg by mouth every  evening. (1700)    [provider]  naproxen (NAPROSYN) 375 MG tablet Take 1 tablet (375 mg total) by mouth 2 (two) times daily. 05/14/17   Evalee Jefferson, PA-C  traMADol (ULTRAM) 50 MG tablet Take 1 tablet (50 mg total) by mouth every 6 (six) hours as needed. 05/14/17   Evalee Jefferson, PA-C    Family History Family History  Problem Relation Age of Onset  . Arthritis Unknown   . Depression Sister     Social History Social History   Tobacco Use  . Smoking status: Never Smoker  . Smokeless tobacco: Never Used  Substance Use Topics  . Alcohol use: No  . Drug use: No     Allergies   Keflex [cephalexin]   Review of Systems Review of Systems  Constitutional: Negative for fever.  Musculoskeletal: Positive for arthralgias and joint swelling. Negative for myalgias.  Skin: Positive for color change.  Neurological: Negative for weakness and numbness.     Physical Exam Updated Vital Signs BP 125/79 (BP Location: Left Arm)   Pulse 74   Temp 98.1 F (36.7 C) (Oral)   Resp 18   Ht 5\' 7"  (1.702 m)   Wt 86.2 kg (190 lb)   SpO2 98%   BMI 29.76 kg/m   Physical Exam  Constitutional: She appears well-developed and well-nourished.  HENT:  Head: Normocephalic.  Cardiovascular: Normal rate and intact distal pulses. Exam reveals no decreased pulses.  Pulses:      Dorsalis pedis pulses are 2+ on the right side, and 2+ on the left side.       Posterior tibial pulses are 2+ on the right side, and 2+ on the left side.  Musculoskeletal: She exhibits edema and tenderness.       Right ankle: She exhibits decreased range of motion, swelling and ecchymosis. She exhibits normal pulse. Tenderness. Lateral malleolus tenderness found. No head of 5th metatarsal and no proximal fibula tenderness found. Achilles tendon normal.  Neurological: She is alert. No sensory deficit.  Skin: Skin is warm, dry and intact.  Nursing note and vitals reviewed.    ED Treatments / Results  Labs (all  labs ordered are listed, but only abnormal results are displayed) Labs Reviewed - No data to display  EKG  EKG Interpretation None       Radiology Dg Ankle Complete Right  Result Date: 05/13/2017 CLINICAL DATA:  71 year old female with fall and right ankle pain. EXAM: RIGHT ANKLE - COMPLETE 3+ VIEW COMPARISON:  None. FINDINGS: Evaluation for fracture is somewhat limited due to osteopenia. There is an area of irregularity of the medial malleolus, likely related to old healed fracture. An acute nondisplaced fracture is less likely. There is no dislocation. There is diffuse soft tissue swelling of the ankle. IMPRESSION: 1. Focal area of irregularity of the medial malleolus, likely chronic. Clinical correlation is recommended. No definite acute fracture. CT may provide better evaluation if there  is high clinical concern for acute fracture. No dislocation. 2. Diffuse soft tissue swelling of the ankle. Electronically Signed   By: Anner Crete M.D.   On: 05/13/2017 23:34   Ct Foot Right Wo Contrast  Result Date: 05/14/2017 CLINICAL DATA:  72-year-old female twisted right ankle and fall. EXAM: CT OF THE RIGHT FOOT WITHOUT CONTRAST TECHNIQUE: Multidetector CT imaging of the right foot was performed according to the standard protocol. Multiplanar CT image reconstructions were also generated. COMPARISON:  Right ankle radiograph dated 05/13/2017 FINDINGS: Bones/Joint/Cartilage There is a nondisplaced acute fracture of the medial malleolus. There is irregularity of the adjacent cortex which may be related to prior injury. Evaluation of the bones is limited due to advanced osteopenia. No other acute fracture identified. There is no dislocation. Ligaments Suboptimally assessed by CT. Muscles and Tendons No intramuscular hematoma. Soft tissues Diffuse soft tissue swelling of the ankle primarily over the medial malleolus. IMPRESSION: Acute nondisplaced fracture of the medial malleolus. No dislocation. Advanced  osteopenia. Electronically Signed   By: Anner Crete M.D.   On: 05/14/2017 01:06    Procedures Procedures (including critical care time)  Medications Ordered in ED Medications  ibuprofen (ADVIL,MOTRIN) tablet 800 mg (800 mg Oral Given 05/14/17 0130)  traMADol (ULTRAM) tablet 50 mg (50 mg Oral Given 05/14/17 0130)     Initial Impression / Assessment and Plan / ED Course  I have reviewed the triage vital signs and the nursing notes.  Pertinent labs & imaging results that were available during my care of the patient were reviewed by me and considered in my medical decision making (see chart for details).     Patient was placed in a Cam walker.  Discussed ice, elevation and minimizing weightbearing.  She has a walker with her and was encouraged to use at all times.  She was referred to Dr. Aline Brochure for further management of this injury.  Final Clinical Impressions(s) / ED Diagnoses   Final diagnoses:  Closed nondisplaced fracture of medial malleolus of right tibia, initial encounter    ED Discharge Orders        Ordered    traMADol (ULTRAM) 50 MG tablet  Every 6 hours PRN     05/14/17 0117    naproxen (NAPROSYN) 375 MG tablet  2 times daily     05/14/17 0117       Evalee Jefferson, PA-C 05/14/17 1847    Orpah Greek, MD 05/25/17 [phone]

## 2017-05-14 NOTE — Discharge Instructions (Signed)
Wear the brace we provided at all times to protect your injury and continue using your walker for help minimize weight bearing on your injured ankle.  Ice and elevation will help with pain and swelling. You may take the tramadol prescribed for pain relief.  This will make you drowsy - do not drive within 4 hours of taking this medication.

## 2017-05-17 ENCOUNTER — Encounter: Payer: Self-pay | Admitting: Neurology

## 2017-05-17 ENCOUNTER — Ambulatory Visit (INDEPENDENT_AMBULATORY_CARE_PROVIDER_SITE_OTHER): Payer: Medicare Other | Admitting: Neurology

## 2017-05-17 DIAGNOSIS — G959 Disease of spinal cord, unspecified: Secondary | ICD-10-CM

## 2017-05-17 HISTORY — DX: Disease of spinal cord, unspecified: G95.9

## 2017-05-17 MED ORDER — GABAPENTIN 100 MG PO CAPS: ORAL_CAPSULE | ORAL | 3 refills | Status: DC

## 2017-05-17 NOTE — Patient Instructions (Signed)
   We will increase the gabapentin 100 mg capsules to one twice a day and 2 at night.

## 2017-05-17 NOTE — Progress Notes (Signed)
Reason for visit: Cervical myelopathy  Melissa Rosales is an 71 y.o. female  History of present illness:  Melissa Rosales is a 71 year old right-handed white female who was seen initially on 04 July 2016 with numbness and paresthesias involving both hands.  Nerve conduction studies previously had not shown any evidence of carpal tunnel syndrome.  The patient was sent for MRI of the cervical spine which showed a cervical myelopathy at C4-5 level, she required decompressive surgery on 07 November 2016.  The patient has continued to have some numbness and dysesthesias of both hands, right greater than left.  She has some slight numbness in the feet as well.  She is on very low-dose gabapentin taking 100 mg twice daily.  The patient indicates that this does help her leg discomfort.  She has had some gait instability, she uses a walker for ambulation.  She denies issues controlling the bowels or the bladder.  She has fallen recently and fractured her right foot, she is in a boot at this time.   Past Medical History:  Diagnosis Date  . Acid reflux   . Arthritis   . Cervical myelopathy ([place]) 05/17/2017   C4-5  . Hypertension     Past Surgical History:  Procedure Laterality Date  . ANTERIOR CERVICAL DECOMP/DISCECTOMY FUSION N/A 11/07/2016   Procedure: ANTERIOR CERVICAL DECOMPRESSION/DISCECTOMY FUSION CERVICAL 4- CERVICAL 5;  Surgeon: Jovita Gamma, MD;  Location: [place];  Service: Neurosurgery;  Laterality: N/A;  ANTERIOR CERVICAL DECOMPRESSION/DISCECTOMY FUSION CERVICAL 4- CERVICAL 5  . COLONOSCOPY    . EYE SURGERY Bilateral   . HARDWARE REMOVAL N/A 11/07/2016   Procedure: anterior cervical disectomy fusion cervical four-five (revision);  Surgeon: Jovita Gamma, MD;  Location: [place];  Service: Neurosurgery;  Laterality: N/A;  . VAGINAL HYSTERECTOMY    . vision problems since birth      Family History  Problem Relation Age of Onset  . Arthritis Unknown   . Depression Sister     Social  history:  reports that  has never smoked. she has never used smokeless tobacco. She reports that she does not drink alcohol or use drugs.    Allergies  Allergen Reactions  . Keflex [Cephalexin] Nausea And Vomiting    Medications:  Prior to Admission medications   Medication Sig Start Date End Date Taking? Authorizing Provider  amLODipine (NORVASC) 10 MG tablet Take 10 mg by mouth daily.    Yes [provider]  aspirin EC 81 MG tablet Take 81 mg by mouth daily.   Yes [provider]  b complex vitamins tablet Take 1 tablet by mouth daily.   Yes [provider]  Calcium Carb-Cholecalciferol (CALCIUM + D3 PO) Take 1 tablet by mouth 2 (two) times daily.   Yes [provider]  clotrimazole-betamethasone (LOTRISONE) cream Apply 1 application topically 2 (two) times daily as needed (irritation).   Yes [provider]  docusate sodium (COLACE) 100 MG capsule Take 100 mg by mouth daily.    Yes [provider]  enalapril (VASOTEC) 20 MG tablet Take 20 mg by mouth every evening. (1700)   Yes [provider]  FIBER FORMULA CAPS Take 1 capsule by mouth daily.   Yes [provider]  furosemide (LASIX) 20 MG tablet Take 20 mg by mouth daily. 09/02/16  Yes [provider]  gabapentin (NEURONTIN) 100 MG capsule Take 100 mg by mouth 2 (two) times daily.  09/13/16  Yes [provider]  hydrochlorothiazide (HYDRODIURIL) 25  MG tablet Take 25 mg by mouth daily.   Yes [provider]  lactulose (CHRONULAC) 10 GM/15ML solution Take 10-20 g by mouth at bedtime as needed (for constipation).   Yes [provider]  Multiple Vitamins-Minerals (MULTIVITAMIN WITH MINERALS) tablet Take 1 tablet by mouth daily.   Yes [provider]  naproxen (NAPROSYN) 375 MG tablet Take 1 tablet (375 mg total) by mouth 2 (two) times daily. 05/14/17  Yes Idol, Almyra Free, PA-C  Omega 3 1000 MG CAPS Take 1 capsule by mouth daily.    Yes [provider]  potassium chloride (K-DUR) 10 MEQ tablet Take 10 mEq by mouth daily. 05/05/17  Yes [provider]  ranitidine (ZANTAC) 150 MG tablet Take 150 mg by mouth daily.    Yes [provider]  traMADol (ULTRAM) 50 MG tablet Take 1 tablet (50 mg total) by mouth every 6 (six) hours as needed. 05/14/17  Yes Idol, Almyra Free, PA-C  vitamin C (ASCORBIC ACID) 500 MG tablet Take 500 mg by mouth daily.   Yes [provider]    ROS:  Out of a complete 14 system review of symptoms, the patient complains only of the following symptoms, and all other reviewed systems are negative.  Hand paresthesias  Blood pressure 129/82, pulse 64, height 5\' 7"  (1.702 m), weight 198 lb 8 oz (90 kg).  Physical Exam  General: The patient is alert and cooperative at the time of the examination.  Skin: No significant peripheral edema is noted.  The right foot is in a brace.   Neurologic Exam  Mental status: The patient is alert and oriented x 3 at the time of the examination. The patient has apparent normal recent and remote memory, with an apparently normal attention span and concentration ability.   Cranial nerves: Facial symmetry is present. Speech is normal, no aphasia or dysarthria is noted. Extraocular movements are full. Visual fields are full.  Motor: The patient has good strength in all 4 extremities.  Sensory examination: Soft touch sensation is symmetric on the face, arms, and legs.  Coordination: The patient has good finger-nose-finger and heel-to-shin bilaterally.  Gait and station: The patient has a wide-based, unsteady gait.  The patient uses a walker for ambulation.  Tandem gait was not attempted.  Romberg is negative. No drift is seen.  Reflexes: Deep tendon reflexes are symmetric.   MRI cervical 08/05/16:  IMPRESSION:  This MRI of the cervical spine without contrast shows multilevel changes as detailed above. The most significant findings  are: 1.    There is scoliosis that is convex to the right.. 2.    At C3-C4 there are degenerative changes causing right greater than left foraminal narrowing that could lead to right C4 nerve root compression. 3.    At C4-C5, there is severe spinal stenosis and apparent myelopathic signal within the spinal cord due to severe degenerative changes. There is bilateral moderate foraminal narrowing but no nerve root compression.. 4.    At C5-C6, there is moderate spinal stenosis and edema within the disc. There does not appear to be nerve root compression. 5.    At C6-C7 there is moderately severe spinal stenosis, worse on the right.  The spinal cord has normal signal. 6.    At C7-T1, there is mild spinal stenosis ages causing right greater than left foraminal narrowing at could lead to right C8 nerve root compression.  * MRI scan images were reviewed online. I agree with the written report.  Assessment/Plan:  1.  Bilateral hand paresthesias, cervical myelopathy  2.  Gait disorder  The patient continues to have some discomfort in the hands, she is on a very low dose of gabapentin, we will go up to 100 mg twice daily and 200 mg at night, she will call for any dose adjustments.  A prescription for gabapentin was sent in.  She will follow-up in 4 months.  [name] MD 05/17/2017 2:50 PM  [facility] [address] [place], [place] [address]  Phone [phone] Fax [phone]

## 2017-05-18 ENCOUNTER — Other Ambulatory Visit: Payer: Self-pay

## 2017-05-18 NOTE — Patient Outreach (Signed)
Outreach patient after ED visit.  I spoke with patient and she verified PCP is current.  She is scheduling a follow up with her specialist.  She does not have any transportation issues (she uses [facility]).  I explained [place] and 24 Hour Nurse Advice Line and she made note of the [place] Number.  She was interested in answering questions for the engagement tool.  Successful letter, know before you go and magnet mailed on today.

## 2017-05-19 ENCOUNTER — Other Ambulatory Visit: Payer: Self-pay

## 2017-05-19 NOTE — Progress Notes (Signed)
This encounter was created in error - please disregard.

## 2017-05-19 NOTE — Patient Outreach (Signed)
[facility]) Care Management  05/19/2017  Melissa Rosales 04-Mar-1946 [id number]   Telephone Screen  Referral Date: 05/18/17 Referral Source: [facility] CM Eligible Patients-ED Census Referral Reason: HTN,ED visit on 05/13/17 Insurance: [facility] Medicare   Outreach attempt # 1 to patient. Spoke with patient. She stets that things are going well for her. She denies any new and/or worsening symptoms. She voices that she is is managing her meds and has no issues or concerns regarding them. She uses [facility] to get back and forth to appts. Patient has hx of HTN, arthritis and GERD,. She voices she has BP machine in the home and BP controlled at present. She checks BP daily and per patient report it is normally in the 120s/70s. She denies needing any education and support on managing her conditions. Patient voices she spoke with administrative assistant on yesterday and voiced that she had no THN needs or concerns at this time and seemed annoyed by duplicate questions being asked. Advised patient that RN CM would send out [facility] contact info via mail for future reference. She voiced understanding and was appreciative of call.    Plan: RN CM will notify [facility] administrative assistant of case status. RN CM will send letter and long with brochure and magnet to patient for future reference.    Enzo Montgomery, RN,BSN,CCM [facility] Telephonic Care Management Coordinator Direct Phone: [phone] Toll Free: [phone] Fax: [phone]

## 2017-05-24 ENCOUNTER — Ambulatory Visit (INDEPENDENT_AMBULATORY_CARE_PROVIDER_SITE_OTHER): Payer: Medicare Other | Admitting: Orthopaedic Surgery

## 2017-05-24 ENCOUNTER — Encounter: Payer: Self-pay | Admitting: Orthopaedic Surgery

## 2017-05-24 VITALS — BP 108/70 | HR 82 | Temp 98.3°F | Ht 65.0 in | Wt 191.0 lb

## 2017-05-24 DIAGNOSIS — S8254XA Nondisplaced fracture of medial malleolus of right tibia, initial encounter for closed fracture: Secondary | ICD-10-CM

## 2017-05-24 NOTE — Progress Notes (Signed)
Subjective:    Patient ID: Melissa Rosales, female    DOB: Oct 09, 1945, 72 y.o.   MRN: [id number]  HPI She hurt her right ankle after a fall from standing up on her bed.  She was seen in the ER.  X-rays and CT scan were done showing a nondisplaced fracture of the medial malleolus.  She was given a CAM walker.  She has no other injury.  I have reviewed the X-rays, CT, reports and ER record.   Review of Systems  HENT: Negative for congestion.   Respiratory: Positive for shortness of breath. Negative for cough.   Cardiovascular: Negative for chest pain and leg swelling.  Endocrine: Positive for cold intolerance.  Musculoskeletal: Positive for arthralgias, gait problem and joint swelling.  Allergic/Immunologic: Positive for environmental allergies.  All other systems reviewed and are negative.  Past Medical History:  Diagnosis Date  . Acid reflux   . Arthritis   . Cervical myelopathy ([place]) 05/17/2017   C4-5  . Hypertension     Past Surgical History:  Procedure Laterality Date  . ANTERIOR CERVICAL DECOMP/DISCECTOMY FUSION N/A 11/07/2016   Procedure: ANTERIOR CERVICAL DECOMPRESSION/DISCECTOMY FUSION CERVICAL 4- CERVICAL 5;  Surgeon: Jovita Gamma, MD;  Location: [facility];  Service: Neurosurgery;  Laterality: N/A;  ANTERIOR CERVICAL DECOMPRESSION/DISCECTOMY FUSION CERVICAL 4- CERVICAL 5  . COLONOSCOPY    . EYE SURGERY Bilateral   . HARDWARE REMOVAL N/A 11/07/2016   Procedure: anterior cervical disectomy fusion cervical four-five (revision);  Surgeon: Jovita Gamma, MD;  Location: [place] [facility];  Service: Neurosurgery;  Laterality: N/A;  . VAGINAL HYSTERECTOMY    . vision problems since birth      Current Outpatient Medications on File Prior to Visit  Medication Sig Dispense Refill  . amLODipine (NORVASC) 10 MG tablet Take 10 mg by mouth daily.     Marland Kitchen aspirin EC 81 MG tablet Take 81 mg by mouth daily.    Marland Kitchen b complex vitamins tablet Take 1 tablet by mouth daily.    . Calcium  Carb-Cholecalciferol (CALCIUM + D3 PO) Take 1 tablet by mouth 2 (two) times daily.    . clotrimazole-betamethasone (LOTRISONE) cream Apply 1 application topically 2 (two) times daily as needed (irritation).    Marland Kitchen docusate sodium (COLACE) 100 MG capsule Take 100 mg by mouth daily.     . enalapril (VASOTEC) 20 MG tablet Take 20 mg by mouth every evening. (1700)    . FIBER FORMULA CAPS Take 1 capsule by mouth daily.    . furosemide (LASIX) 20 MG tablet Take 20 mg by mouth daily.    Marland Kitchen gabapentin (NEURONTIN) 100 MG capsule One capsule twice a day and 2 at night 120 capsule 3  . hydrochlorothiazide (HYDRODIURIL) 25 MG tablet Take 25 mg by mouth daily.    Marland Kitchen lactulose (CHRONULAC) 10 GM/15ML solution Take 10-20 g by mouth at bedtime as needed (for constipation).    . Multiple Vitamins-Minerals (MULTIVITAMIN WITH MINERALS) tablet Take 1 tablet by mouth daily.    . naproxen (NAPROSYN) 375 MG tablet Take 1 tablet (375 mg total) by mouth 2 (two) times daily. 20 tablet 0  . Omega 3 1000 MG CAPS Take 1 capsule by mouth daily.    . potassium chloride (K-DUR) 10 MEQ tablet Take 10 mEq by mouth daily.    . ranitidine (ZANTAC) 150 MG tablet Take 150 mg by mouth daily.     . traMADol (ULTRAM) 50 MG tablet Take 1 tablet (50 mg total) by mouth every 6 (  six) hours as needed. 20 tablet 0  . vitamin C (ASCORBIC ACID) 500 MG tablet Take 500 mg by mouth daily.     No current facility-administered medications on file prior to visit.     Social History   Socioeconomic History  . Marital status: Legally Separated    Spouse name: Not on file  . Number of children: 0  . Years of education: 10  . Highest education level: Not on file  Social Needs  . Financial resource strain: Not on file  . Food insecurity - worry: Not on file  . Food insecurity - inability: Not on file  . Transportation needs - medical: Not on file  . Transportation needs - non-medical: Not on file  Occupational History  . Occupation: Retired    Tobacco Use  . Smoking status: Never Smoker  . Smokeless tobacco: Never Used  Substance and Sexual Activity  . Alcohol use: No  . Drug use: No  . Sexual activity: No  Other Topics Concern  . Not on file  Social History Narrative   Lives alone. Niece stays with her.   Caffeine use: Drinks coffee- 1 cup per day    Family History  Problem Relation Age of Onset  . Arthritis Unknown   . Heart disease Mother   . Depression Sister     BP 108/70   Pulse 82   Temp 98.3 F (36.8 C)   Ht 5\' 5"  (1.651 m)   Wt 191 lb (86.6 kg)   BMI 31.78 kg/m      Objective:   Physical Exam  Constitutional: She is oriented to person, place, and time. She appears well-developed and well-nourished.  HENT:  Head: Normocephalic and atraumatic.  Eyes: Conjunctivae and EOM are normal. Pupils are equal, round, and reactive to light.  Neck: Normal range of motion. Neck supple.  Cardiovascular: Normal rate, regular rhythm and intact distal pulses.  Pulmonary/Chest: Effort normal.  Abdominal: Soft.  Musculoskeletal: She exhibits tenderness (Right ankle tender medially, little swelling, no ecchymosis, ROM full but tender, NV intact.  Left ankle negative.).  Neurological: She is alert and oriented to person, place, and time. She displays normal reflexes. No cranial nerve deficit. She exhibits normal muscle tone. Coordination normal.  Skin: Skin is warm and dry.  Psychiatric: She has a normal mood and affect. Her behavior is normal. Judgment and thought content normal.  Vitals reviewed.         Assessment & Plan:   Encounter Diagnosis  Name Primary?  . Closed nondisplaced fracture of medial malleolus of right tibia, initial encounter Yes   Continue the CAM walker and walker.  Return in two weeks.  X-rays of the right ankle on return.  Call if any problem.  Precautions discussed.   Electronically Signed Sanjuana Kava, MD 1/2/20191:55 PM

## 2017-06-07 ENCOUNTER — Ambulatory Visit (INDEPENDENT_AMBULATORY_CARE_PROVIDER_SITE_OTHER): Payer: Medicare Other

## 2017-06-07 ENCOUNTER — Ambulatory Visit (INDEPENDENT_AMBULATORY_CARE_PROVIDER_SITE_OTHER): Payer: Self-pay | Admitting: Orthopaedic Surgery

## 2017-06-07 ENCOUNTER — Encounter: Payer: Self-pay | Admitting: Orthopaedic Surgery

## 2017-06-07 DIAGNOSIS — S8254XD Nondisplaced fracture of medial malleolus of right tibia, subsequent encounter for closed fracture with routine healing: Secondary | ICD-10-CM

## 2017-06-07 NOTE — Progress Notes (Signed)
CC: my ankle is better  She has less pain to the right ankle.  She has a CAM walker and cane.  She has no new trauma.  NV intact. ROM is good.  There is still some swelling of the ankle, more laterally.  X-rays were done, reported separately.  Encounter Diagnosis  Name Primary?  . Closed nondisplaced fracture of medial malleolus of right tibia with routine healing, subsequent encounter Yes   Return in three weeks.  X-rays on return.  Call if any problem.  Precautions discussed.   Electronically Signed Sanjuana Kava, MD 1/16/20192:01 PM

## 2017-06-23 DIAGNOSIS — R600 Localized edema: Secondary | ICD-10-CM | POA: Diagnosis not present

## 2017-06-23 DIAGNOSIS — M179 Osteoarthritis of knee, unspecified: Secondary | ICD-10-CM | POA: Diagnosis not present

## 2017-06-23 DIAGNOSIS — E782 Mixed hyperlipidemia: Secondary | ICD-10-CM | POA: Diagnosis not present

## 2017-06-23 DIAGNOSIS — I10 Essential (primary) hypertension: Secondary | ICD-10-CM | POA: Diagnosis not present

## 2017-06-28 ENCOUNTER — Encounter: Payer: Self-pay | Admitting: Orthopaedic Surgery

## 2017-06-28 ENCOUNTER — Ambulatory Visit (INDEPENDENT_AMBULATORY_CARE_PROVIDER_SITE_OTHER): Payer: Medicare Other

## 2017-06-28 ENCOUNTER — Ambulatory Visit (INDEPENDENT_AMBULATORY_CARE_PROVIDER_SITE_OTHER): Payer: Self-pay | Admitting: Orthopaedic Surgery

## 2017-06-28 DIAGNOSIS — S8254XD Nondisplaced fracture of medial malleolus of right tibia, subsequent encounter for closed fracture with routine healing: Secondary | ICD-10-CM

## 2017-06-28 NOTE — Progress Notes (Signed)
CC:  May ankle is not hurting  She is using a CAM walker and cane   She has no pain of the right ankle.  X-rays were done, reported separately.  The fracture is healing  Encounter Diagnosis  Name Primary?  . Closed nondisplaced fracture of medial malleolus of right tibia with routine healing, subsequent encounter Yes   She can stop the CAM walker.  Return in two weeks.  X-rays then.  Call if any problem.  Precautions discussed.   Electronically Signed Sanjuana Kava, MD 2/6/20192:32 PM

## 2017-07-11 DIAGNOSIS — L84 Corns and callosities: Secondary | ICD-10-CM | POA: Diagnosis not present

## 2017-07-11 DIAGNOSIS — L602 Onychogryphosis: Secondary | ICD-10-CM | POA: Diagnosis not present

## 2017-07-11 DIAGNOSIS — I739 Peripheral vascular disease, unspecified: Secondary | ICD-10-CM | POA: Diagnosis not present

## 2017-07-12 ENCOUNTER — Ambulatory Visit: Payer: Medicare Other | Admitting: Orthopaedic Surgery

## 2017-07-18 ENCOUNTER — Encounter: Payer: Self-pay | Admitting: Orthopaedic Surgery

## 2017-07-18 ENCOUNTER — Ambulatory Visit (INDEPENDENT_AMBULATORY_CARE_PROVIDER_SITE_OTHER): Payer: Medicare Other

## 2017-07-18 ENCOUNTER — Ambulatory Visit (INDEPENDENT_AMBULATORY_CARE_PROVIDER_SITE_OTHER): Payer: Medicare Other | Admitting: Orthopaedic Surgery

## 2017-07-18 DIAGNOSIS — S8254XD Nondisplaced fracture of medial malleolus of right tibia, subsequent encounter for closed fracture with routine healing: Secondary | ICD-10-CM | POA: Diagnosis not present

## 2017-07-18 NOTE — Progress Notes (Signed)
CC:  My ankle is much better. It is only a little sore at times.  She is walking well with her cane. She has no problem.  NV intact right ankle.  ROM is full.  She has swelling.  She is walking well.  X-rays were done of the right ankle,reported separately.  Encounter Diagnosis  Name Primary?  . Closed nondisplaced fracture of medial malleolus of right tibia with routine healing, subsequent encounter Yes   Discharge.  Call if any problem.  Precautions discussed.   Electronically Signed Sanjuana Kava, MD 2/26/20192:11 PM

## 2017-07-19 ENCOUNTER — Telehealth: Payer: Self-pay | Admitting: Neurology

## 2017-07-19 MED ORDER — GABAPENTIN 300 MG PO CAPS: ORAL_CAPSULE | ORAL | 4 refills | Status: DC

## 2017-07-19 NOTE — Telephone Encounter (Signed)
I called the patient.  The patient is not getting much relief from low-dose gabapentin, her renal function is completely normal.  She is otherwise tolerating the drug.  We will go up on the dose significantly.  The patient will take 300 mg twice daily for 2 weeks and then go to 300 mg in the morning and 600 mg at night.  She will call for any dose adjustments.

## 2017-07-19 NOTE — Telephone Encounter (Signed)
Pt calling to make Dr Jannifer Franklin aware that in spite of the last medication he gave her for hands it has not helped.  Pt states her hands are in the same condition.  Please call

## 2017-07-19 NOTE — Addendum Note (Signed)
Addended by: [name] on: 07/19/2017 04:51 PM   Modules accepted: Orders

## 2017-08-01 ENCOUNTER — Ambulatory Visit: Payer: Medicare Other | Admitting: Neurology

## 2017-08-22 DIAGNOSIS — B351 Tinea unguium: Secondary | ICD-10-CM | POA: Diagnosis not present

## 2017-08-22 DIAGNOSIS — I739 Peripheral vascular disease, unspecified: Secondary | ICD-10-CM | POA: Diagnosis not present

## 2017-08-22 DIAGNOSIS — M19072 Primary osteoarthritis, left ankle and foot: Secondary | ICD-10-CM | POA: Diagnosis not present

## 2017-08-22 DIAGNOSIS — L84 Corns and callosities: Secondary | ICD-10-CM | POA: Diagnosis not present

## 2017-08-22 DIAGNOSIS — M19071 Primary osteoarthritis, right ankle and foot: Secondary | ICD-10-CM | POA: Diagnosis not present

## 2017-09-21 ENCOUNTER — Ambulatory Visit: Payer: Medicare Other | Admitting: Adult Health

## 2017-09-26 ENCOUNTER — Encounter: Payer: Self-pay | Admitting: Adult Health

## 2017-09-26 ENCOUNTER — Ambulatory Visit (INDEPENDENT_AMBULATORY_CARE_PROVIDER_SITE_OTHER): Payer: Medicare Other | Admitting: Adult Health

## 2017-09-26 VITALS — BP 114/72 | HR 53 | Ht 67.0 in | Wt 191.0 lb

## 2017-09-26 DIAGNOSIS — M79601 Pain in right arm: Secondary | ICD-10-CM | POA: Diagnosis not present

## 2017-09-26 DIAGNOSIS — R202 Paresthesia of skin: Secondary | ICD-10-CM | POA: Diagnosis not present

## 2017-09-26 DIAGNOSIS — M79602 Pain in left arm: Secondary | ICD-10-CM

## 2017-09-26 DIAGNOSIS — G959 Disease of spinal cord, unspecified: Secondary | ICD-10-CM

## 2017-09-26 MED ORDER — DULOXETINE HCL 20 MG PO CPEP: 20.0000 mg | ORAL_CAPSULE | Freq: Every day | ORAL | 3 refills | Status: DC

## 2017-09-26 NOTE — Patient Instructions (Signed)
Your Plan:  Continue gabapentin Start Cymbalta 20 mg daily If your symptoms worsen or you develop new symptoms please let us know.   Thank you for coming to see [place] at [facility]. I hope we have been able to provide you high quality care today.  You may receive a patient satisfaction survey over the next few weeks. We would appreciate your feedback and comments so that we may continue to improve ourselves and the health of our patients.  Duloxetine delayed-release capsules What is this medicine? DULOXETINE (doo LOX e teen) is used to treat depression, anxiety, and different types of chronic pain. This medicine may be used for other purposes; ask your health care provider or pharmacist if you have questions. COMMON BRAND NAME(S): Cymbalta, [place]ren[place] What should I tell my health care provider before I take this medicine? They need to know if you have any of these conditions: -bipolar disorder or a family history of bipolar disorder -glaucoma -kidney disease -liver disease -suicidal thoughts or a previous suicide attempt -taken medicines called MAOIs like Carbex, Eldepryl, Marplan, Nardil, and Parnate within 14 days -an unusual reaction to duloxetine, other medicines, foods, dyes, or preservatives -pregnant or trying to get pregnant -breast-feeding How should I use this medicine? Take this medicine by mouth with a glass of water. Follow the directions on the prescription label. Do not cut, crush or chew this medicine. You can take this medicine with or without food. Take your medicine at regular intervals. Do not take your medicine more often than directed. Do not stop taking this medicine suddenly except upon the advice of your doctor. Stopping this medicine too quickly may cause serious side effects or your condition may worsen. A special MedGuide will be given to you by the pharmacist with each prescription and refill. Be sure to read this information carefully each  time. Talk to your pediatrician regarding the use of this medicine in children. While this drug may be prescribed for children as young as 34 years of age for selected conditions, precautions do apply. Overdosage: If you think you have taken too much of this medicine contact a poison control center or emergency room at once. NOTE: This medicine is only for you. Do not share this medicine with others. What if I miss a dose? If you miss a dose, take it as soon as you can. If it is almost time for your next dose, take only that dose. Do not take double or extra doses. What may interact with this medicine? Do not take this medicine with any of the following medications: -desvenlafaxine -levomilnacipran -linezolid -MAOIs like Carbex, Eldepryl, Marplan, Nardil, and Parnate -methylene blue (injected into a vein) -milnacipran -thioridazine -venlafaxine This medicine may also interact with the following medications: -alcohol -amphetamines -aspirin and aspirin-like medicines -certain antibiotics like ciprofloxacin and enoxacin -certain medicines for blood pressure, heart disease, irregular heart beat -certain medicines for depression, anxiety, or psychotic disturbances -certain medicines for migraine headache like almotriptan, eletriptan, frovatriptan, naratriptan, rizatriptan, sumatriptan, zolmitriptan -certain medicines that treat or prevent blood clots like warfarin, enoxaparin, and dalteparin -cimetidine -fentanyl -lithium -NSAIDS, medicines for pain and inflammation, like ibuprofen or naproxen -phentermine -procarbazine -rasagiline -sibutramine -St. John's wort -theophylline -tramadol -tryptophan This list may not describe all possible interactions. Give your health care provider a list of all the medicines, herbs, non-prescription drugs, or dietary supplements you use. Also tell them if you smoke, drink alcohol, or use illegal drugs. Some items may interact with your medicine. What  should  I watch for while using this medicine? Tell your doctor if your symptoms do not get better or if they get worse. Visit your doctor or health care professional for regular checks on your progress. Because it may take several weeks to see the full effects of this medicine, it is important to continue your treatment as prescribed by your doctor. Patients and their families should watch out for new or worsening thoughts of suicide or depression. Also watch out for sudden changes in feelings such as feeling anxious, agitated, panicky, irritable, hostile, aggressive, impulsive, severely restless, overly excited and hyperactive, or not being able to sleep. If this happens, especially at the beginning of treatment or after a change in dose, call your health care professional. Dennis Bast may get drowsy or dizzy. Do not drive, use machinery, or do anything that needs mental alertness until you know how this medicine affects you. Do not stand or sit up quickly, especially if you are an older patient. This reduces the risk of dizzy or fainting spells. Alcohol may interfere with the effect of this medicine. Avoid alcoholic drinks. This medicine can cause an increase in blood pressure. This medicine can also cause a sudden drop in your blood pressure, which may make you feel faint and increase the chance of a fall. These effects are most common when you first start the medicine or when the dose is increased, or during use of other medicines that can cause a sudden drop in blood pressure. Check with your doctor for instructions on monitoring your blood pressure while taking this medicine. Your mouth may get dry. Chewing sugarless gum or sucking hard candy, and drinking plenty of water may help. Contact your doctor if the problem does not go away or is severe. What side effects may I notice from receiving this medicine? Side effects that you should report to your doctor or health care professional as soon as  possible: -allergic reactions like skin rash, itching or hives, swelling of the face, lips, or tongue -anxious -breathing problems -confusion -changes in vision -chest pain -confusion -elevated mood, decreased need for sleep, racing thoughts, impulsive behavior -eye pain -fast, irregular heartbeat -feeling faint or lightheaded, falls -feeling agitated, angry, or irritable -hallucination, loss of contact with reality -high blood pressure -loss of balance or coordination -palpitations -redness, blistering, peeling or loosening of the skin, including inside the mouth -restlessness, pacing, inability to keep still -seizures -stiff muscles -suicidal thoughts or other mood changes -trouble passing urine or change in the amount of urine -trouble sleeping -unusual bleeding or bruising -unusually weak or tired -vomiting -yellowing of the eyes or skin Side effects that usually do not require medical attention (report to your doctor or health care professional if they continue or are bothersome): -change in sex drive or performance -change in appetite or weight -constipation -dizziness -dry mouth -headache -increased sweating -nausea -tired This list may not describe all possible side effects. Call your doctor for medical advice about side effects. You may report side effects to FDA at 1-800-FDA-1088. Where should I keep my medicine? Keep out of the reach of children. Store at room temperature between 20 and 25 degrees C (68 to 77 degrees F). Throw away any unused medicine after the expiration date. NOTE: This sheet is a summary. It may not cover all possible information. If you have questions about this medicine, talk to your doctor, pharmacist, or health care provider.  2018 Elsevier/Gold Standard (2015-10-08 18:16:03)

## 2017-09-26 NOTE — Progress Notes (Signed)
I have read the note, and I agree with the clinical assessment and plan.  Charles K Willis   

## 2017-09-26 NOTE — Progress Notes (Signed)
PATIENT: Melissa Rosales DOB: 1945-12-04  REASON FOR VISIT: follow up HISTORY FROM: patient  HISTORY OF PRESENT ILLNESS: Today 09/26/17 Melissa Rosales is a 72 year old female with a history of numbness and paresthesias in the upper extremities.  She returns today for evaluation.  At the last visit gabapentin was increased however the patient has not noticed any benefit in regards to the numbness and paresthesias in the hands.  She states that the right hand is worse than the left.  She reports cold sensitivity and therefore wears long sleeve shirt and gloves particularly on the right side.  She did have decompressive surgery November 07, 2016 but this did not improve her symptoms.  She does not feel the symptoms have gotten worse but does not feel that she is gotten much relief.  She returns today for evaluation.  HISTORY Melissa Rosales is a 72 year old right-handed white female who was seen initially on 04 July 2016 with numbness and paresthesias involving both hands.  Nerve conduction studies previously had not shown any evidence of carpal tunnel syndrome.  The patient was sent for MRI of the cervical spine which showed a cervical myelopathy at C4-5 level, she required decompressive surgery on 07 November 2016.  The patient has continued to have some numbness and dysesthesias of both hands, right greater than left.  She has some slight numbness in the feet as well.  She is on very low-dose gabapentin taking 100 mg twice daily.  The patient indicates that this does help her leg discomfort.  She has had some gait instability, she uses a walker for ambulation.  She denies issues controlling the bowels or the bladder.  She has fallen recently and fractured her right foot, she is in a boot at this time.     REVIEW OF SYSTEMS: Out of a complete 14 system review of symptoms, the patient complains only of the following symptoms, and all other reviewed systems are negative.  See HPI  ALLERGIES: Allergies    Allergen Reactions  . Keflex [Cephalexin] Nausea And Vomiting    HOME MEDICATIONS: Outpatient Medications Prior to Visit  Medication Sig Dispense Refill  . amLODipine (NORVASC) 10 MG tablet Take 10 mg by mouth daily.     Marland Kitchen aspirin EC 81 MG tablet Take 81 mg by mouth daily.    Marland Kitchen b complex vitamins tablet Take 1 tablet by mouth daily.    . Calcium Carb-Cholecalciferol (CALCIUM + D3 PO) Take 1 tablet by mouth 2 (two) times daily.    . clotrimazole-betamethasone (LOTRISONE) cream Apply 1 application topically 2 (two) times daily as needed (irritation).    Marland Kitchen docusate sodium (COLACE) 100 MG capsule Take 100 mg by mouth daily.     . enalapril (VASOTEC) 20 MG tablet Take 20 mg by mouth every evening. (1700)    . FIBER FORMULA CAPS Take 1 capsule by mouth daily.    . furosemide (LASIX) 20 MG tablet Take 20 mg by mouth daily.    Marland Kitchen gabapentin (NEURONTIN) 300 MG capsule 1 capsule twice daily for 2 weeks, then take 1 in the morning and 2 in the evening 90 capsule 4  . hydrochlorothiazide (HYDRODIURIL) 25 MG tablet Take 25 mg by mouth daily.    Marland Kitchen lactulose (CHRONULAC) 10 GM/15ML solution Take 10-20 g by mouth at bedtime as needed (for constipation).    . Multiple Vitamins-Minerals (MULTIVITAMIN WITH MINERALS) tablet Take 1 tablet by mouth daily.    . naproxen (NAPROSYN) 375 MG tablet Take 1  tablet (375 mg total) by mouth 2 (two) times daily. 20 tablet 0  . Omega 3 1000 MG CAPS Take 1 capsule by mouth daily.    . potassium chloride (K-DUR) 10 MEQ tablet Take 10 mEq by mouth daily.    . ranitidine (ZANTAC) 150 MG tablet Take 150 mg by mouth daily.     . traMADol (ULTRAM) 50 MG tablet Take 1 tablet (50 mg total) by mouth every 6 (six) hours as needed. 20 tablet 0  . vitamin C (ASCORBIC ACID) 500 MG tablet Take 500 mg by mouth daily.     No facility-administered medications prior to visit.     PAST MEDICAL HISTORY: Past Medical History:  Diagnosis Date  . Acid reflux   . Arthritis   . Cervical  myelopathy ([place]) 05/17/2017   C4-5  . Hypertension     PAST SURGICAL HISTORY: Past Surgical History:  Procedure Laterality Date  . ANTERIOR CERVICAL DECOMP/DISCECTOMY FUSION N/A 11/07/2016   Procedure: ANTERIOR CERVICAL DECOMPRESSION/DISCECTOMY FUSION CERVICAL 4- CERVICAL 5;  Surgeon: Jovita Gamma, MD;  Location: [facility];  Service: Neurosurgery;  Laterality: N/A;  ANTERIOR CERVICAL DECOMPRESSION/DISCECTOMY FUSION CERVICAL 4- CERVICAL 5  . COLONOSCOPY    . EYE SURGERY Bilateral   . HARDWARE REMOVAL N/A 11/07/2016   Procedure: anterior cervical disectomy fusion cervical four-five (revision);  Surgeon: Jovita Gamma, MD;  Location: [facility];  Service: Neurosurgery;  Laterality: N/A;  . VAGINAL HYSTERECTOMY    . vision problems since birth      FAMILY HISTORY: Family History  Problem Relation Age of Onset  . Arthritis Unknown   . Heart disease Mother   . Depression Sister     SOCIAL HISTORY: Social History   Socioeconomic History  . Marital status: Legally Separated    Spouse name: Not on file  . Number of children: 0  . Years of education: 10  . Highest education level: Not on file  Occupational History  . Occupation: Retired  Scientific laboratory technician  . Financial resource strain: Not on file  . Food insecurity:    Worry: Not on file    Inability: Not on file  . Transportation needs:    Medical: Not on file    Non-medical: Not on file  Tobacco Use  . Smoking status: Never Smoker  . Smokeless tobacco: Never Used  Substance and Sexual Activity  . Alcohol use: No  . Drug use: No  . Sexual activity: Never  Lifestyle  . Physical activity:    Days per week: Not on file    Minutes per session: Not on file  . Stress: Not on file  Relationships  . Social connections:    Talks on phone: Not on file    Gets together: Not on file    Attends religious service: Not on file    Active member of club or organization: Not on file    Attends meetings of clubs or organizations: Not on  file    Relationship status: Not on file  . Intimate partner violence:    Fear of current or ex partner: Not on file    Emotionally abused: Not on file    Physically abused: Not on file    Forced sexual activity: Not on file  Other Topics Concern  . Not on file  Social History Narrative   Lives alone. Niece stays with her.   Caffeine use: Drinks coffee- 1 cup per day      PHYSICAL EXAM  Vitals:   09/26/17  1431  BP: 114/72  Pulse: (!) 53  Weight: 191 lb (86.6 kg)  Height: 5\' 7"  (1.702 m)   Body mass index is 29.91 kg/m.  Generalized: Well developed, in no acute distress   Neurological examination  Mentation: Alert oriented to time, place, history taking. Follows all commands speech and language fluent Cranial nerve II-XII: Pupils were equal round reactive to light. Extraocular movements were full, visual field were full on confrontational test. Facial sensation and strength were normal. Uvula tongue midline. Head turning and shoulder shrug  were normal and symmetric. Motor: The motor testing reveals 5 over 5 strength of all 4 extremities. Good symmetric motor tone is noted throughout.  Sensory: Sensory testing is intact to soft touch on all 4 extremities.  Pinprick and vibration sensation intact in the upper extremities.  No evidence of extinction is noted.  Coordination: Cerebellar testing reveals good finger-nose-finger and heel-to-shin bilaterally.  Gait and station: Patient uses a cane when ambulating.  Tandem gait not attempted. Reflexes: Deep tendon reflexes are symmetric and normal bilaterally.   DIAGNOSTIC DATA (LABS, IMAGING, TESTING) - I reviewed patient records, labs, notes, testing and imaging myself where available.  Lab Results  Component Value Date   WBC 8.1 10/28/2016   HGB 12.2 10/28/2016   HCT 38.2 10/28/2016   MCV 85.5 10/28/2016   PLT 185 10/28/2016      Component Value Date/Time   NA 141 11/07/2016 0629   K 3.3 (L) 11/07/2016 0629   CL 105  11/07/2016 0629   CO2 26 11/07/2016 0629   GLUCOSE 91 11/07/2016 0629   BUN 18 11/07/2016 0629   CREATININE 0.99 11/07/2016 0629   CALCIUM 9.5 11/07/2016 0629   GFRNONAA 56 (L) 11/07/2016 0629   GFRAA >60 11/07/2016 0629      ASSESSMENT AND PLAN 72 y.o. year old female  has a past medical history of Acid reflux, Arthritis, Cervical myelopathy ([place]) (05/17/2017), and Hypertension. here with:  1.  Cervical myelopathy 2.  Paresthesias in the upper extremity  The patient will continue on gabapentin we will add on Cymbalta to see if this offers her any additional benefit.  We will start low-dose 20 mg daily.  If the patient does not see any benefit within the next couple of weeks she will give [place] a call.  She is advised that if her symptoms worsen or she develops new symptoms she should let us know.  She will follow-up in 6 months or sooner as needed.     Ward Givens, MSN, NP-C 09/26/2017, 2:50 PM [facility] [address] [place], [place] [address] [phone]

## 2017-10-17 ENCOUNTER — Telehealth: Payer: Self-pay | Admitting: Adult Health

## 2017-10-17 MED ORDER — DULOXETINE HCL 30 MG PO CPEP: 30.0000 mg | ORAL_CAPSULE | Freq: Every day | ORAL | 3 refills | Status: DC

## 2017-10-17 NOTE — Telephone Encounter (Signed)
Pt called stating that DULoxetine (CYMBALTA) 20 MG capsule has not helped with the tingling in her hands, requesting a call back to discuss

## 2017-10-17 NOTE — Telephone Encounter (Signed)
I called and spoke with patient and confirmed that she has been taking the gabapentin regularly along with they Cymbalta 20mg  but has not noticed a difference. I discussed this with [name], NP and received VO for patient to increase Cymbalta to 30mg  daily and call in a couple of weeks with an update. I spoke with patient and she voiced understanding and appreciation and will call us back if the increased dose doesn't help. Rx sent to pharmacy.

## 2017-10-23 ENCOUNTER — Ambulatory Visit (INDEPENDENT_AMBULATORY_CARE_PROVIDER_SITE_OTHER): Payer: Medicare Other | Admitting: Otolaryngology

## 2017-10-23 DIAGNOSIS — H903 Sensorineural hearing loss, bilateral: Secondary | ICD-10-CM

## 2017-10-24 DIAGNOSIS — B351 Tinea unguium: Secondary | ICD-10-CM | POA: Diagnosis not present

## 2017-10-24 DIAGNOSIS — L84 Corns and callosities: Secondary | ICD-10-CM | POA: Diagnosis not present

## 2017-10-26 ENCOUNTER — Other Ambulatory Visit (HOSPITAL_COMMUNITY): Payer: Self-pay | Admitting: Internal Medicine

## 2017-10-26 DIAGNOSIS — E2839 Other primary ovarian failure: Secondary | ICD-10-CM

## 2017-11-16 ENCOUNTER — Other Ambulatory Visit (HOSPITAL_COMMUNITY): Payer: Self-pay

## 2017-11-17 ENCOUNTER — Ambulatory Visit (HOSPITAL_COMMUNITY)
Admission: RE | Admit: 2017-11-17 | Discharge: 2017-11-17 | Disposition: A | Discharge status: Home / Self Care | Payer: Medicare Other | Source: Ambulatory Visit | Attending: Internal Medicine | Admitting: Internal Medicine

## 2017-11-17 DIAGNOSIS — Z13820 Encounter for screening for osteoporosis: Secondary | ICD-10-CM | POA: Insufficient documentation

## 2017-11-17 DIAGNOSIS — E2839 Other primary ovarian failure: Secondary | ICD-10-CM | POA: Diagnosis present

## 2017-11-17 DIAGNOSIS — M85852 Other specified disorders of bone density and structure, left thigh: Secondary | ICD-10-CM | POA: Diagnosis not present

## 2017-11-17 DIAGNOSIS — Z780 Asymptomatic menopausal state: Secondary | ICD-10-CM | POA: Diagnosis not present

## 2017-11-21 NOTE — Telephone Encounter (Signed)
Patient says she still has tingling in her hands after increase in DULoxetine (CYMBALTA) 30 MG capsule. Please call and discuss.

## 2017-11-22 MED ORDER — DULOXETINE HCL 30 MG PO CPEP: 30.0000 mg | ORAL_CAPSULE | Freq: Two times a day (BID) | ORAL | 1 refills | Status: DC

## 2017-11-22 NOTE — Telephone Encounter (Signed)
Spoke with Melissa Rosales.  She is agreeable to increasing Cymbalta to bid. New rx. escribed to [facility] per her request/fim

## 2017-11-22 NOTE — Addendum Note (Signed)
Addended by: France Ravens I on: 11/22/2017 02:02 PM   Modules accepted: Orders

## 2017-11-22 NOTE — Telephone Encounter (Signed)
LMOM (identified vm)  Per [name], will offer to increase Cymbalta to 30mg  bid.  If she still doesn't get relief of tingling after several wks, will change medications/fim

## 2017-11-28 DIAGNOSIS — Z1389 Encounter for screening for other disorder: Secondary | ICD-10-CM | POA: Diagnosis not present

## 2017-11-28 DIAGNOSIS — M25511 Pain in right shoulder: Secondary | ICD-10-CM | POA: Diagnosis not present

## 2017-11-28 DIAGNOSIS — M67921 Unspecified disorder of synovium and tendon, right upper arm: Secondary | ICD-10-CM | POA: Diagnosis not present

## 2017-12-04 NOTE — Telephone Encounter (Signed)
Patient just started her increased dose of duloxetine 30mg , one capsule BID on 11/22/17.  Per vo by Ward Givens, NP, she should give this increased dosage a full month to see if her symptoms will improve.  I left the patient a message this morning requesting a return call.  I will also try her again this afternoon.

## 2017-12-04 NOTE — Telephone Encounter (Signed)
Spoke to patient - she is willing to give the duloxetine another couple of weeks to see if the recent dose increase will be beneficial.  She will call back if her symptoms do not start to improve.

## 2017-12-04 NOTE — Telephone Encounter (Signed)
Since increasing DULoxetine (CYMBALTA) 30 MG capsule tingling  in her leg has gotten worse and her hand is not any better. Please call to discuss after 12pm today. She uses [facility] in [place].

## 2017-12-04 NOTE — Telephone Encounter (Signed)
Left message requesting a return call.

## 2017-12-07 ENCOUNTER — Other Ambulatory Visit: Payer: Self-pay | Admitting: Neurology

## 2017-12-13 DIAGNOSIS — L02612 Cutaneous abscess of left foot: Secondary | ICD-10-CM | POA: Diagnosis not present

## 2017-12-13 DIAGNOSIS — I739 Peripheral vascular disease, unspecified: Secondary | ICD-10-CM | POA: Diagnosis not present

## 2017-12-13 DIAGNOSIS — D2371 Other benign neoplasm of skin of right lower limb, including hip: Secondary | ICD-10-CM | POA: Diagnosis not present

## 2017-12-14 LAB — GLUCOSE, POCT (MANUAL RESULT ENTRY): POC GLUCOSE: 92 mg/dL (ref 70–99)

## 2017-12-22 ENCOUNTER — Telehealth: Payer: Self-pay | Admitting: Neurology

## 2017-12-22 NOTE — Telephone Encounter (Signed)
This patient has a history of a cervical myelopathy, she has ongoing dysesthesias and discomfort in the arms and legs.  She is calling because of some increased pain in the legs.  She is on Cymbalta taking 30 mg twice daily, she takes a total of 900 mg daily of gabapentin.  She will go up on the gabapentin dose taking 300 mg twice during the day and 600 mg at night.  She will call if this is not effective.

## 2017-12-26 ENCOUNTER — Telehealth: Payer: Self-pay | Admitting: Adult Health

## 2017-12-26 NOTE — Telephone Encounter (Signed)
Patient calling to discuss nerve medication but she does not know the name.

## 2017-12-27 NOTE — Telephone Encounter (Signed)
I spoke to pt and she stated that she has been taking the gabapentin 300/300/600 and cymbalta 30 po bid.  Still having the tingling in her hands.  Please advise.

## 2017-12-27 NOTE — Telephone Encounter (Signed)
Called and I could not hear well, a lot of people in background.  I will call her back about 1500.

## 2017-12-28 NOTE — Telephone Encounter (Signed)
I contacted the patient and advised of NP's recommendation. Patient denied any drowsiness at present and was agreeable to increasing the Gabapentin to 600 mg in the morning, 300 mg at lunch and 600 mg at bedtime. Patient was advised to call back if this adjustment did not improve her symptoms. Ms. Connon voiced understanding and had no further questions at this time.

## 2017-12-28 NOTE — Telephone Encounter (Signed)
She has been on the  Increased dose for approximately 5 to 6 days.  She may want to give this more time.  However if she wants to increase her dose she could increase gabapentin taking 600 mg in the morning, 300 mg at lunch and 600 mg at bedtime.  Please advised the patient that taking gabapentin during the day may cause drowsiness.  If she is already experiencing drowsiness and we should not increase the dose.

## 2018-01-03 ENCOUNTER — Other Ambulatory Visit: Payer: Self-pay

## 2018-01-03 MED ORDER — GABAPENTIN 300 MG PO CAPS: ORAL_CAPSULE | ORAL | 5 refills | Status: DC

## 2018-01-03 NOTE — Telephone Encounter (Signed)
Patient called back to report the pain and tingling in her right leg is still present. I confirmed with the patient she is taking the increased gabapentin dosage (300 mg 2 tab in the am, 1 in the afternoon and 2 in the pm). She reported compliance with the dosage and has helped with the tingling sensation in her hands but has not touched the sensation in her right leg. Patient stated the pain in her right leg comes and goes through out the day and resting does not help when the pain is present.

## 2018-01-03 NOTE — Telephone Encounter (Signed)
Can we get her a revisit so we can discuss medication options.

## 2018-01-03 NOTE — Telephone Encounter (Signed)
I contacted the patient and offered her an appointment for tomorrow at 8 am and 1 pm. Patient stated she could not make either of those appointments because they were too early and she would have a hard time getting transportation. Patient has an appointment scheduled for 04/17/2018 with Hedwig Morton NP at 3 pm. I offered a sooner appointment in October on the 23rd and patient stated she could not schedule that appointment because it was at 1130 am and not at 3 pm. Patient was advised at this time to continue the gabapentin at 300 mg 2 tabs in the am, 1 in the afternoon and 2 in the evening.  Patient advised if we have a sooner appointment become available we would call. MB RN

## 2018-01-04 ENCOUNTER — Other Ambulatory Visit: Payer: Self-pay

## 2018-01-04 NOTE — Patient Outreach (Signed)
[facility]) Care Management  01/04/2018  Melissa Rosales [birth year] [id number]   Medication Adherence call to Melissa Rosales left a message for patient to call back patient is showing due on Enalepril 20 mg. Melissa Rosales is showing past due under [place].   [place] [place] Management Direct Dial [phone]  Fax [phone] [email or web address]

## 2018-01-19 ENCOUNTER — Telehealth: Payer: Self-pay | Admitting: Neurology

## 2018-01-19 NOTE — Telephone Encounter (Signed)
I returned patient`s call to answering machine .She stated increase pain in leg and numbness in hands. She is on gabapentin 600 mg-300mg -600mg  daily and cymbalta 30 mg twice daily. I recommend she increase GBP to 600mg   tid and call back next week. She voiced understanding

## 2018-01-23 DIAGNOSIS — M7752 Other enthesopathy of left foot: Secondary | ICD-10-CM | POA: Diagnosis not present

## 2018-01-23 DIAGNOSIS — L84 Corns and callosities: Secondary | ICD-10-CM | POA: Diagnosis not present

## 2018-01-23 DIAGNOSIS — B351 Tinea unguium: Secondary | ICD-10-CM | POA: Diagnosis not present

## 2018-01-29 ENCOUNTER — Other Ambulatory Visit: Payer: Self-pay

## 2018-01-29 NOTE — Telephone Encounter (Signed)
If patient calls back please schedule her with Dr. Eugenie Birks or Jinny Blossom NP about the legs hurting and gabapentin not working.  Unable to reach patient. Will call again.

## 2018-01-29 NOTE — Telephone Encounter (Signed)
Please have your schedule a follow-up appt with me or Dr. Jannifer Franklin

## 2018-01-29 NOTE — Telephone Encounter (Signed)
Pt has called and wants Dr Leonie Man to be aware that even with the increase in the gabapentin it has not helped with her legs.  Pt is asking for a call

## 2018-01-30 NOTE — Telephone Encounter (Signed)
RN call patient back about the increase gabapentin,and cymbalta for numbness in her legs, and hands. Rn stated its recommend by [employer] she be seen in the office on her schedule or Dr. Jannifer Franklin schedule. Rn offer the pt following appts listed below.  01/31/2018 0730 with Jinny Blossom NP pt decline appt stating she needs afternoon appts better.  01/31/2018 RN offer with Dr. Jannifer Franklin 0330 appt. Pt decline appt stating it will be hard to find her a ride by tomorrow to be seen. Rn stated to pt Dr. Jannifer Franklin has a 0730 slot available the whole month of SEpt. Rn stated [name] NP does not have anymore appts for Sept 2019. Pt decline stating it was too early. PT stated to look into October 2019.Rn stated to pt Dr. Jannifer Franklin only has 0730 slots at this time. PT decline again. Pt already has appt with [name] NP in 03/2018. Rn recommend she call our office back if any afternoon appts become available.Pt verbalized understanding.

## 2018-02-14 ENCOUNTER — Other Ambulatory Visit (HOSPITAL_COMMUNITY): Payer: Self-pay | Admitting: Family Medicine

## 2018-02-14 DIAGNOSIS — Z1231 Encounter for screening mammogram for malignant neoplasm of breast: Secondary | ICD-10-CM

## 2018-03-02 ENCOUNTER — Ambulatory Visit (HOSPITAL_COMMUNITY): Payer: Medicare Other

## 2018-03-02 ENCOUNTER — Ambulatory Visit (HOSPITAL_COMMUNITY)
Admission: RE | Admit: 2018-03-02 | Discharge: 2018-03-02 | Disposition: A | Discharge status: Home / Self Care | Payer: Medicare Other | Source: Ambulatory Visit | Attending: Family Medicine | Admitting: Family Medicine

## 2018-03-02 DIAGNOSIS — Z1231 Encounter for screening mammogram for malignant neoplasm of breast: Secondary | ICD-10-CM | POA: Insufficient documentation

## 2018-03-07 ENCOUNTER — Other Ambulatory Visit (HOSPITAL_COMMUNITY): Payer: Self-pay | Admitting: Family Medicine

## 2018-03-07 ENCOUNTER — Other Ambulatory Visit: Payer: Self-pay | Admitting: Adult Health

## 2018-03-07 DIAGNOSIS — R928 Other abnormal and inconclusive findings on diagnostic imaging of breast: Secondary | ICD-10-CM

## 2018-03-16 DIAGNOSIS — K219 Gastro-esophageal reflux disease without esophagitis: Secondary | ICD-10-CM | POA: Diagnosis not present

## 2018-03-16 DIAGNOSIS — I10 Essential (primary) hypertension: Secondary | ICD-10-CM | POA: Diagnosis not present

## 2018-03-16 DIAGNOSIS — E782 Mixed hyperlipidemia: Secondary | ICD-10-CM | POA: Diagnosis not present

## 2018-03-16 DIAGNOSIS — Z0001 Encounter for general adult medical examination with abnormal findings: Secondary | ICD-10-CM | POA: Diagnosis not present

## 2018-03-16 DIAGNOSIS — M179 Osteoarthritis of knee, unspecified: Secondary | ICD-10-CM | POA: Diagnosis not present

## 2018-03-16 DIAGNOSIS — I872 Venous insufficiency (chronic) (peripheral): Secondary | ICD-10-CM | POA: Diagnosis not present

## 2018-03-16 DIAGNOSIS — M1991 Primary osteoarthritis, unspecified site: Secondary | ICD-10-CM | POA: Diagnosis not present

## 2018-03-16 DIAGNOSIS — G603 Idiopathic progressive neuropathy: Secondary | ICD-10-CM | POA: Diagnosis not present

## 2018-03-16 DIAGNOSIS — Z23 Encounter for immunization: Secondary | ICD-10-CM | POA: Diagnosis not present

## 2018-03-16 DIAGNOSIS — Z1389 Encounter for screening for other disorder: Secondary | ICD-10-CM | POA: Diagnosis not present

## 2018-03-17 DIAGNOSIS — Z1389 Encounter for screening for other disorder: Secondary | ICD-10-CM | POA: Diagnosis not present

## 2018-03-18 DIAGNOSIS — Z0001 Encounter for general adult medical examination with abnormal findings: Secondary | ICD-10-CM | POA: Diagnosis not present

## 2018-03-20 ENCOUNTER — Ambulatory Visit (HOSPITAL_COMMUNITY): Payer: Medicare Other

## 2018-03-20 ENCOUNTER — Encounter (HOSPITAL_COMMUNITY): Payer: Self-pay

## 2018-03-27 ENCOUNTER — Ambulatory Visit (HOSPITAL_COMMUNITY)
Admission: RE | Admit: 2018-03-27 | Discharge: 2018-03-27 | Disposition: A | Discharge status: Home / Self Care | Payer: Medicare Other | Source: Ambulatory Visit | Attending: Family Medicine | Admitting: Family Medicine

## 2018-03-27 DIAGNOSIS — R928 Other abnormal and inconclusive findings on diagnostic imaging of breast: Secondary | ICD-10-CM | POA: Diagnosis not present

## 2018-03-27 DIAGNOSIS — N6320 Unspecified lump in the left breast, unspecified quadrant: Secondary | ICD-10-CM | POA: Diagnosis not present

## 2018-03-29 ENCOUNTER — Other Ambulatory Visit (HOSPITAL_COMMUNITY): Payer: Self-pay | Admitting: Family Medicine

## 2018-03-29 DIAGNOSIS — N6320 Unspecified lump in the left breast, unspecified quadrant: Secondary | ICD-10-CM

## 2018-04-02 ENCOUNTER — Telehealth: Payer: Self-pay | Admitting: Adult Health

## 2018-04-02 NOTE — Telephone Encounter (Signed)
LMVM for pt that she did have refill on her cymbalta at [facility]  Spoke to [employer], Software engineer and he will get ready for pt.  I told her to keep appt for future refills 04-17-18 with MM.

## 2018-04-02 NOTE — Telephone Encounter (Signed)
Patient called and requested a refill on her Cymbalta. Please call and advise.

## 2018-04-03 ENCOUNTER — Ambulatory Visit (HOSPITAL_COMMUNITY)
Admission: RE | Admit: 2018-04-03 | Discharge: 2018-04-03 | Disposition: A | Discharge status: Home / Self Care | Payer: Medicare Other | Source: Ambulatory Visit | Attending: Internal Medicine | Admitting: Internal Medicine

## 2018-04-03 ENCOUNTER — Other Ambulatory Visit (HOSPITAL_COMMUNITY): Payer: Self-pay | Admitting: Internal Medicine

## 2018-04-03 ENCOUNTER — Ambulatory Visit (HOSPITAL_COMMUNITY)
Admission: RE | Admit: 2018-04-03 | Discharge: 2018-04-03 | Disposition: A | Discharge status: Home / Self Care | Payer: Medicare Other | Source: Ambulatory Visit | Attending: Family Medicine | Admitting: Family Medicine

## 2018-04-03 DIAGNOSIS — R928 Other abnormal and inconclusive findings on diagnostic imaging of breast: Secondary | ICD-10-CM

## 2018-04-03 DIAGNOSIS — D0512 Intraductal carcinoma in situ of left breast: Secondary | ICD-10-CM | POA: Diagnosis not present

## 2018-04-03 DIAGNOSIS — N6320 Unspecified lump in the left breast, unspecified quadrant: Secondary | ICD-10-CM

## 2018-04-03 DIAGNOSIS — R590 Localized enlarged lymph nodes: Secondary | ICD-10-CM | POA: Diagnosis not present

## 2018-04-03 DIAGNOSIS — N6321 Unspecified lump in the left breast, upper outer quadrant: Secondary | ICD-10-CM | POA: Diagnosis not present

## 2018-04-03 DIAGNOSIS — Z170 Estrogen receptor positive status [ER+]: Secondary | ICD-10-CM | POA: Diagnosis not present

## 2018-04-03 DIAGNOSIS — C50412 Malignant neoplasm of upper-outer quadrant of left female breast: Secondary | ICD-10-CM | POA: Diagnosis not present

## 2018-04-03 MED ORDER — LIDOCAINE HCL (PF) 2 % IJ SOLN
INTRAMUSCULAR | Status: AC
  Filled 2018-04-03: qty 10

## 2018-04-03 MED ORDER — LIDOCAINE-EPINEPHRINE (PF) 1 %-1:200000 IJ SOLN
INTRAMUSCULAR | Status: AC
  Filled 2018-04-03: qty 30

## 2018-04-03 MED ORDER — LIDOCAINE HCL (PF) 1 % IJ SOLN
INTRAMUSCULAR | Status: AC
  Filled 2018-04-03: qty 5

## 2018-04-16 ENCOUNTER — Telehealth: Payer: Self-pay | Admitting: Adult Health

## 2018-04-16 NOTE — Telephone Encounter (Signed)
She will have to schedule further out or be put on waiting list

## 2018-04-16 NOTE — Telephone Encounter (Signed)
Pt requesting a call stating she can not make any appt unless they are at 3pm. Current NP nor MD dont have any around or at that time. Please call to advise.

## 2018-04-16 NOTE — Telephone Encounter (Signed)
Called patient and advised her this RN will put her in soonest available FU at 3 pm or later.  She agreed; scheduled her with Dr Jannifer Franklin in June and put her on wait list for Dr Jannifer Franklin and Edman Circle, NP.  She then asked what she can do about her hand tingling and numbness.  She stated she has had slight improvement with the addition of Cymbalta. This RN advised will let NP know and see if either gabapentin or Cymbalta can be increased, will call her back with NP's reply.  She verbalized understanding, appreciation.

## 2018-04-17 ENCOUNTER — Ambulatory Visit: Payer: Medicare Other | Admitting: Adult Health

## 2018-04-17 ENCOUNTER — Encounter

## 2018-04-18 ENCOUNTER — Encounter: Payer: Self-pay | Admitting: *Deleted

## 2018-04-18 DIAGNOSIS — H548 Legal blindness, as defined in USA: Secondary | ICD-10-CM | POA: Diagnosis not present

## 2018-04-18 DIAGNOSIS — I10 Essential (primary) hypertension: Secondary | ICD-10-CM | POA: Diagnosis not present

## 2018-04-18 DIAGNOSIS — C50412 Malignant neoplasm of upper-outer quadrant of left female breast: Secondary | ICD-10-CM | POA: Diagnosis not present

## 2018-04-18 MED ORDER — DULOXETINE HCL 30 MG PO CPEP: ORAL_CAPSULE | ORAL | 5 refills | Status: DC

## 2018-04-18 NOTE — Telephone Encounter (Signed)
Left message, on both home and mobile numbers, requesting a call back.  

## 2018-04-18 NOTE — Telephone Encounter (Signed)
Please advise patient that she can increase Cymbalta to 1 tablet in the morning and 2 tablets in the evening for a total of 90 mg daily.  I have sent in a new prescription.  Also advised the patient that taking tramadol in addition to Cymbalta can increase the concentrations of Cymbalta.  The patient should limit her use of tramadol while on Cymbalta.

## 2018-04-18 NOTE — Telephone Encounter (Signed)
Spoke to patient - she is aware of the new duloxetine dosage and is agreement with the change.  Says she is no longer taking Tramadol.

## 2018-04-22 IMAGING — CR DG CERVICAL SPINE 2 OR 3 VIEWS
1 series · 1 of 1 positions shown · non-contrast
Comparison: Cervical spine radiographs 08/26/2016

CLINICAL DATA: ACDF C4-5.

EXAM:
CERVICAL SPINE - 2-3 VIEW

[AP]
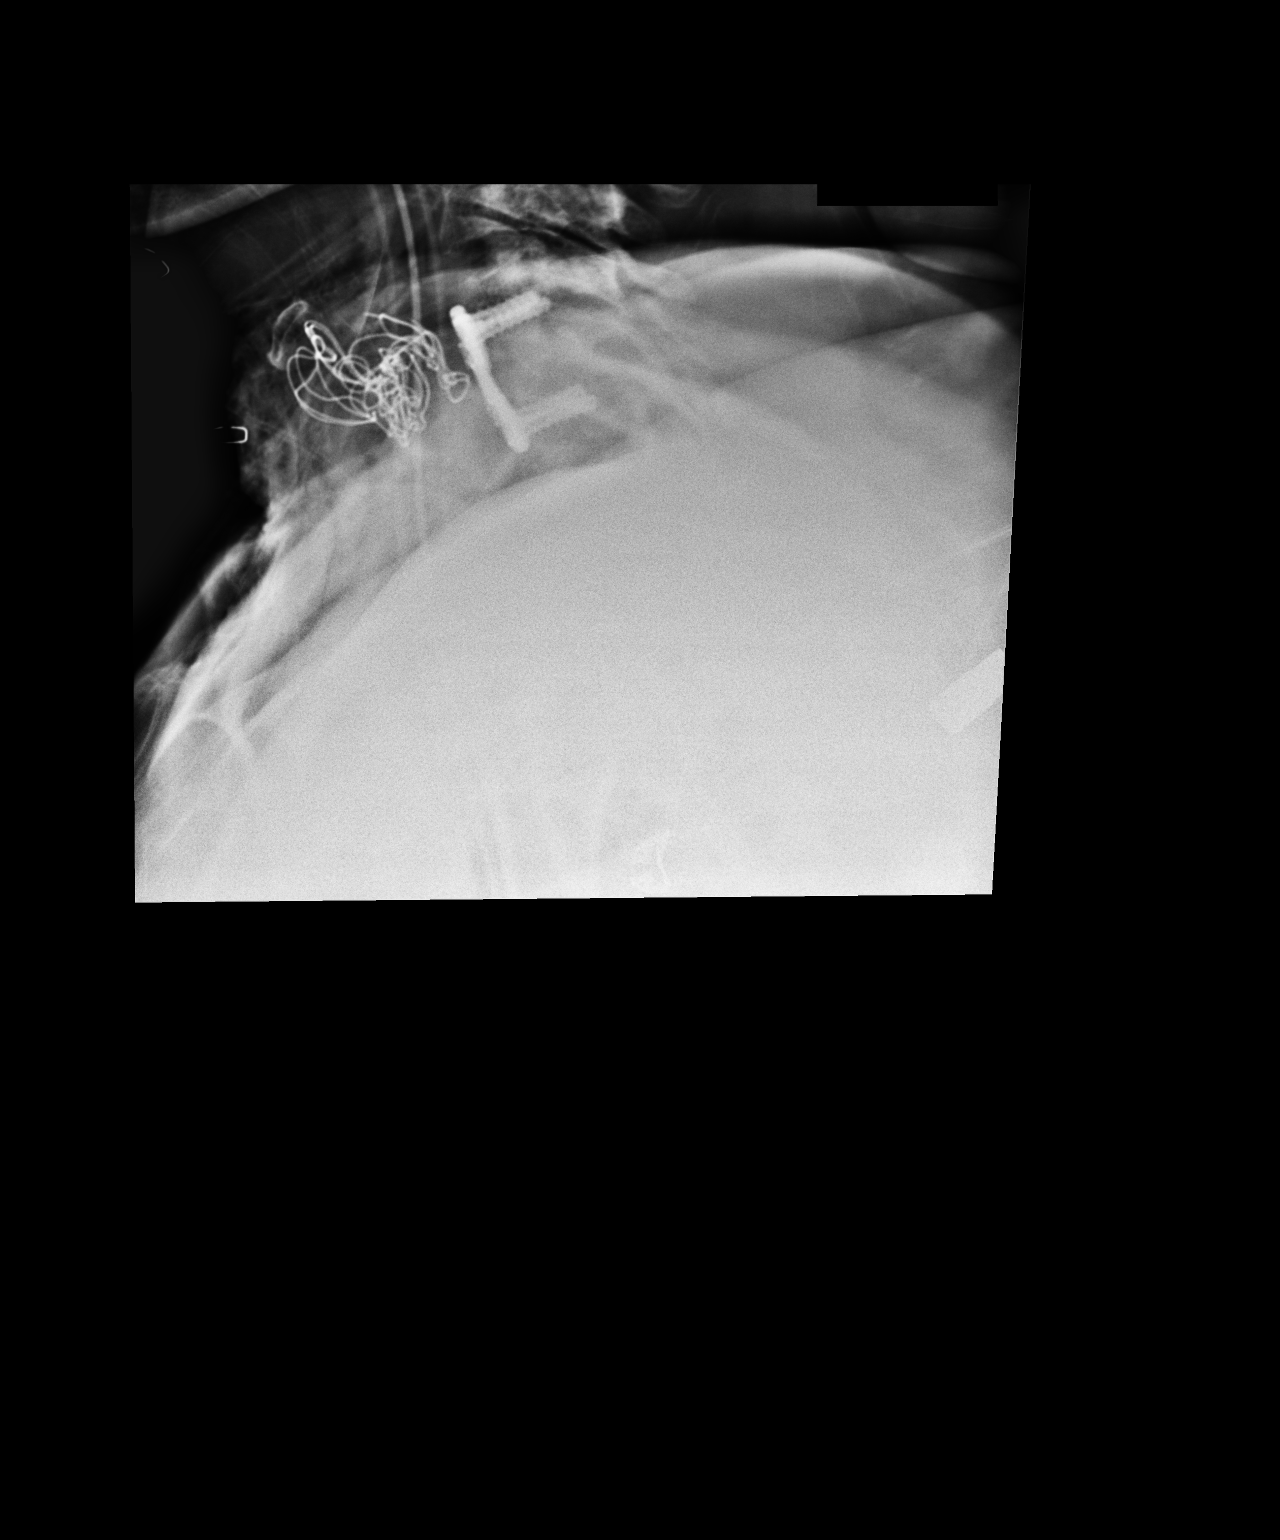

[1 of 1 positions shown; findings below may reference images not displayed]

FINDINGS: The patient is intubated. The tip of the needle projects over the
C4-5 interspace.
IMPRESSION: Intraoperative localization of C4-5.

## 2018-04-22 IMAGING — RF DG CERVICAL SPINE 2 OR 3 VIEWS
1 series · 2 of 2 positions shown · non-contrast
Comparison: None.

CLINICAL DATA: C4-5 ACDF

EXAM:
CERVICAL SPINE - 2-3 VIEW; DG C-ARM 61-120 MIN

[Series 1: run · 2 of 2 slices shown]
[im 1/2]
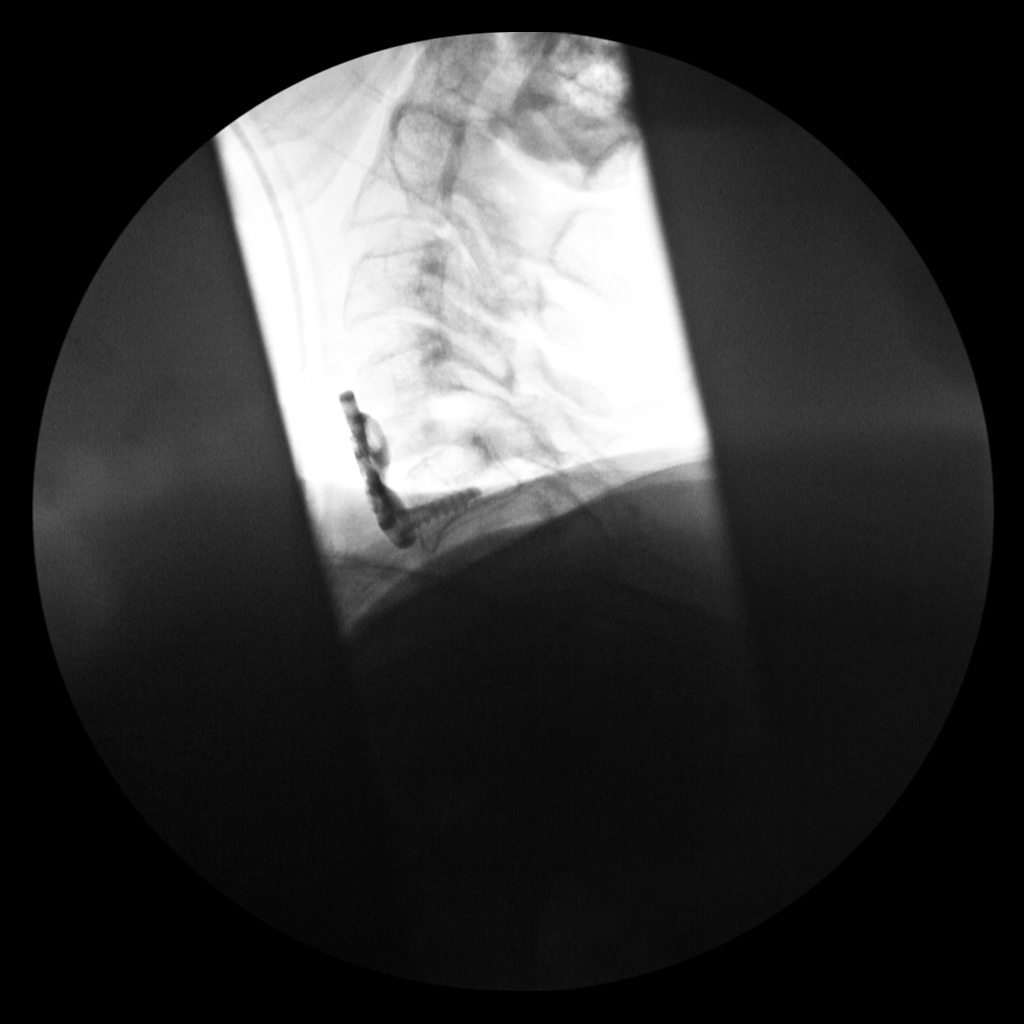
[im 2/2]
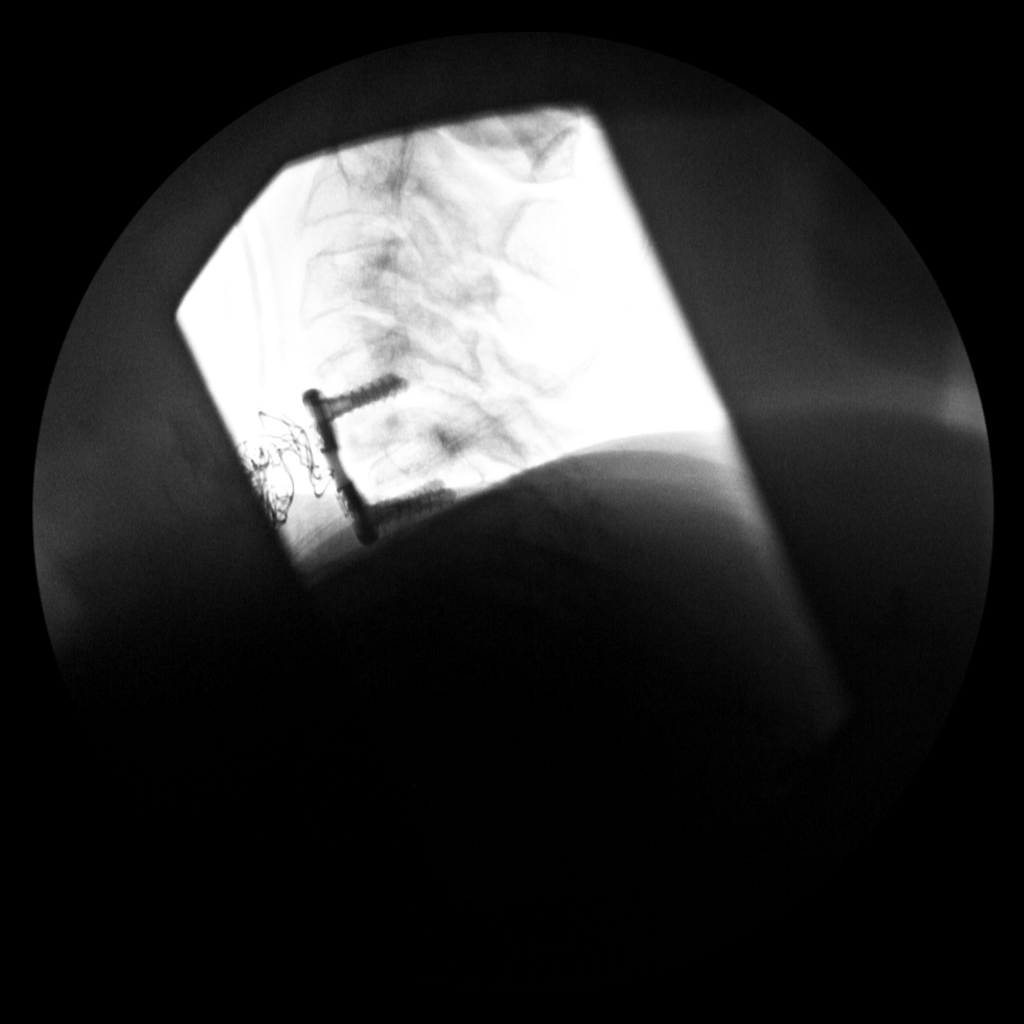

[2 of 2 positions shown; findings below may reference images not displayed]

FINDINGS: Two intraoperative spot images demonstrate changes of ACDF at C4-5.
No hardware bony complicating feature visible.
IMPRESSION: C4-5 ACDF without visible complicating feature.

## 2018-04-25 ENCOUNTER — Encounter: Payer: Self-pay | Admitting: Hematology and Oncology

## 2018-04-25 ENCOUNTER — Telehealth: Payer: Self-pay | Admitting: Hematology and Oncology

## 2018-04-25 NOTE — Telephone Encounter (Signed)
I received a new referral from Dr. Dalbert Batman at [facility] for breast cancer. Pt has been cld and scheduled to see Dr. Lindi Adie on 12/9 at 345pm. Melissa Rosales is aware to arrive 30 minutes early. Letter mailed.

## 2018-04-30 ENCOUNTER — Telehealth: Payer: Self-pay | Admitting: *Deleted

## 2018-04-30 ENCOUNTER — Inpatient Hospital Stay: Payer: Medicare Other | Attending: Hematology and Oncology | Admitting: Hematology and Oncology

## 2018-04-30 DIAGNOSIS — C50412 Malignant neoplasm of upper-outer quadrant of left female breast: Secondary | ICD-10-CM | POA: Diagnosis not present

## 2018-04-30 DIAGNOSIS — Z170 Estrogen receptor positive status [ER+]: Secondary | ICD-10-CM | POA: Insufficient documentation

## 2018-04-30 NOTE — Assessment & Plan Note (Signed)
04/03/2018: Screening detected left breast mass 1.9 cm UOQ, ultrasound revealed 1.4 x 0.7 x 1.2 cm mass 2 o'clock position 8 cm from the nipple, 2 axillary lymph nodes with thickened cortices measuring 6 mm, Biopsy of the left breast mass IDC with mucinous features grade 1-2 with DCIS intermediate grade, lymph node benign, ER 90%, PR 0%, Ki-67 10%, HER-2 equivocal by IHC FISH negative ratio 1.13 copy #1.7, T1c N0 stage Ia  Pathology and radiology counseling:Discussed with the patient, the details of pathology including the type of breast cancer,the clinical staging, the significance of ER, PR and HER-2/neu receptors and the implications for treatment. After reviewing the pathology in detail, we proceeded to discuss the different treatment options between surgery, radiation, chemotherapy, antiestrogen therapies.  Recommendations: 1. Breast conserving surgery followed by 2. Oncotype DX testing to determine if chemotherapy would be of any benefit followed by 3. Adjuvant radiation therapy followed by 4. Adjuvant antiestrogen therapy  Oncotype counseling: I discussed Oncotype DX test. I explained to the patient that this is a 21 gene panel to evaluate patient tumors DNA to calculate recurrence score. This would help determine whether patient has high risk or intermediate risk or low risk breast cancer. She understands that if her tumor was found to be high risk, she would benefit from systemic chemotherapy. If low risk, no need of chemotherapy. If she was found to be intermediate risk, we would need to evaluate the score as well as other risk factors and determine if an abbreviated chemotherapy may be of benefit.  Return to clinic after surgery to discuss final pathology report and then determine if Oncotype DX testing will need to be sent.

## 2018-04-30 NOTE — Telephone Encounter (Signed)
I have lvm on phone few times has not return my calls

## 2018-04-30 NOTE — Progress Notes (Signed)
[facility] CONSULT NOTE  Patient Care Team: Redmond School, MD as PCP - General (Internal Medicine) Iran Planas, MD as Consulting Physician (Orthopedic Surgery)  CHIEF COMPLAINTS/PURPOSE OF CONSULTATION:  Newly diagnosed breast cancer  HISTORY OF PRESENTING ILLNESS:  Melissa Rosales 72 y.o. female is here because of recent diagnosis of left breast cancer.  Patient had a routine screening mammogram that detected a left breast mass measuring 1.9 cm.  Ultrasound was performed which revealed a 1.4 cm mass.  There were 2 axillary lymph nodes and a biopsy of the lymph node was benign.  Biopsy of the primary tumor of the left breast revealed grade 1-2 IDC with DCIS that was ER positive PR negative HER-2 negative with a Ki-67 of 10%.  Patient saw Dr. Dalbert Batman who recommended breast conserving surgery with sentinel lymph node biopsy.  She is here today to discuss overall treatment plan.  I reviewed her records extensively and collaborated the history with the patient.  SUMMARY OF ONCOLOGIC HISTORY:   Malignant neoplasm of upper-outer quadrant of left breast in female, estrogen receptor positive ([place])   04/03/2018 Initial Diagnosis    Screening detected left breast mass 1.9 cm UOQ, ultrasound revealed 1.4 x 0.7 x 1.2 cm mass 2 o'clock position 8 cm from the nipple, 2 axillary lymph nodes with thickened cortices measuring 6 mm, Biopsy of the left breast mass IDC with mucinous features grade 1-2 with DCIS intermediate grade, lymph node benign, ER 90%, PR 0%, Ki-67 10%, HER-2 equivocal by IHC FISH negative ratio 1.13 copy #1.7, T1c N0 stage Ia    04/30/2018 Cancer Staging    Staging form: Breast, AJCC 8th Edition - Clinical stage from 04/30/2018: Stage IA (cT1c, cN0, cM0, G2, ER+, PR-, HER2-) - Signed by Nicholas Lose, MD on 04/30/2018      MEDICAL HISTORY:  Past Medical History:  Diagnosis Date  . Acid reflux   . Arthritis   . Cervical myelopathy ([place]) 05/17/2017   C4-5  .  Hypertension     SURGICAL HISTORY: Past Surgical History:  Procedure Laterality Date  . ANTERIOR CERVICAL DECOMP/DISCECTOMY FUSION N/A 11/07/2016   Procedure: ANTERIOR CERVICAL DECOMPRESSION/DISCECTOMY FUSION CERVICAL 4- CERVICAL 5;  Surgeon: Jovita Gamma, MD;  Location: [facility];  Service: Neurosurgery;  Laterality: N/A;  ANTERIOR CERVICAL DECOMPRESSION/DISCECTOMY FUSION CERVICAL 4- CERVICAL 5  . COLONOSCOPY    . EYE SURGERY Bilateral   . HARDWARE REMOVAL N/A 11/07/2016   Procedure: anterior cervical disectomy fusion cervical four-five (revision);  Surgeon: Jovita Gamma, MD;  Location: [facility];  Service: Neurosurgery;  Laterality: N/A;  . VAGINAL HYSTERECTOMY    . vision problems since birth      SOCIAL HISTORY: Social History   Socioeconomic History  . Marital status: Legally Separated    Spouse name: Not on file  . Number of children: 0  . Years of education: 10  . Highest education level: Not on file  Occupational History  . Occupation: Retired  Scientific laboratory technician  . Financial resource strain: Not on file  . Food insecurity:    Worry: Not on file    Inability: Not on file  . Transportation needs:    Medical: Not on file    Non-medical: Not on file  Tobacco Use  . Smoking status: Never Smoker  . Smokeless tobacco: Never Used  Substance and Sexual Activity  . Alcohol use: No  . Drug use: No  . Sexual activity: Never  Lifestyle  . Physical activity:    Days  per week: Not on file    Minutes per session: Not on file  . Stress: Not on file  Relationships  . Social connections:    Talks on phone: Not on file    Gets together: Not on file    Attends religious service: Not on file    Active member of club or organization: Not on file    Attends meetings of clubs or organizations: Not on file    Relationship status: Not on file  . Intimate partner violence:    Fear of current or ex partner: Not on file    Emotionally abused: Not on file    Physically abused: Not on file     Forced sexual activity: Not on file  Other Topics Concern  . Not on file  Social History Narrative   Lives alone. Niece stays with her.   Caffeine use: Drinks coffee- 1 cup per day    FAMILY HISTORY: Family History  Problem Relation Age of Onset  . Arthritis Unknown   . Heart disease Mother   . Depression Sister     ALLERGIES:  is allergic to keflex [cephalexin].  MEDICATIONS:  Current Outpatient Medications  Medication Sig Dispense Refill  . amLODipine (NORVASC) 10 MG tablet Take 10 mg by mouth daily.     Marland Kitchen aspirin EC 81 MG tablet Take 81 mg by mouth daily.    Marland Kitchen b complex vitamins tablet Take 1 tablet by mouth daily.    . Calcium Carb-Cholecalciferol (CALCIUM + D3 PO) Take 1 tablet by mouth 2 (two) times daily.    . clotrimazole-betamethasone (LOTRISONE) cream Apply 1 application topically 2 (two) times daily as needed (irritation).    Marland Kitchen docusate sodium (COLACE) 100 MG capsule Take 100 mg by mouth daily.     . DULoxetine (CYMBALTA) 30 MG capsule Take 1 tablet in the morning and 2 tablets at bedtime. 90 capsule 5  . enalapril (VASOTEC) 20 MG tablet Take 20 mg by mouth every evening. (1700)    . FIBER FORMULA CAPS Take 1 capsule by mouth daily.    . furosemide (LASIX) 20 MG tablet Take 20 mg by mouth daily.    Marland Kitchen gabapentin (NEURONTIN) 300 MG capsule 2 CAPSULES IN THE MORNING 1 IN THE AFTERNOON AND 2 CAPSULES IN THE EVENING. 150 capsule 5  . hydrochlorothiazide (HYDRODIURIL) 25 MG tablet Take 25 mg by mouth daily.    Marland Kitchen lactulose (CHRONULAC) 10 GM/15ML solution Take 10-20 g by mouth at bedtime as needed (for constipation).    . Multiple Vitamins-Minerals (MULTIVITAMIN WITH MINERALS) tablet Take 1 tablet by mouth daily.    . naproxen (NAPROSYN) 375 MG tablet Take 1 tablet (375 mg total) by mouth 2 (two) times daily. 20 tablet 0  . Omega 3 1000 MG CAPS Take 1 capsule by mouth daily.    . potassium chloride (K-DUR) 10 MEQ tablet Take 10 mEq by mouth daily.    . ranitidine (ZANTAC)  150 MG tablet Take 150 mg by mouth daily.     . vitamin C (ASCORBIC ACID) 500 MG tablet Take 500 mg by mouth daily.     No current facility-administered medications for this visit.     REVIEW OF SYSTEMS:   Constitutional: Denies fevers, chills or abnormal night sweats Eyes: Denies blurriness of vision, double vision or watery eyes Ears, nose, mouth, throat, and face: Denies mucositis or sore throat Respiratory: Denies cough, dyspnea or wheezes Cardiovascular: Denies palpitation, chest discomfort or lower extremity swelling Gastrointestinal:  Denies nausea, heartburn or change in bowel habits Skin: Denies abnormal skin rashes Lymphatics: Denies new lymphadenopathy or easy bruising Neurological:Denies numbness, tingling or new weaknesses Behavioral/Psych: Mood is stable, no new changes   All other systems were reviewed with the patient and are negative.  PHYSICAL EXAMINATION: ECOG PERFORMANCE STATUS: 0 - Asymptomatic  Vitals:   04/30/18 1542  BP: (!) 146/83  Pulse: 68  Resp: 16  Temp: 98.7 F (37.1 C)  SpO2: 98%   Filed Weights   04/30/18 1542  Weight: 198 lb 9.6 oz (90.1 kg)    GENERAL:alert, no distress and comfortable SKIN: skin color, texture, turgor are normal, no rashes or significant lesions EYES: Legally blind OROPHARYNX:no exudate, no erythema and lips, buccal mucosa, and tongue normal  NECK: supple, thyroid normal size, non-tender, without nodularity LYMPH:  no palpable lymphadenopathy in the cervical, axillary or inguinal LUNGS: clear to auscultation and percussion with normal breathing effort HEART: regular rate & rhythm and no murmurs and no lower extremity edema ABDOMEN:abdomen soft, non-tender and normal bowel sounds Musculoskeletal:no cyanosis of digits and no clubbing  PSYCH: alert & oriented x 3 with fluent speech NEURO: no focal motor/sensory deficits BREAST: No palpable nodules in breast. No palpable axillary or supraclavicular lymphadenopathy (exam  performed in the presence of a chaperone)   LABORATORY DATA:  I have reviewed the data as listed Lab Results  Component Value Date   WBC 8.1 10/28/2016   HGB 12.2 10/28/2016   HCT 38.2 10/28/2016   MCV 85.5 10/28/2016   PLT 185 10/28/2016   Lab Results  Component Value Date   NA 141 11/07/2016   K 3.3 (L) 11/07/2016   CL 105 11/07/2016   CO2 26 11/07/2016    RADIOGRAPHIC STUDIES: I have personally reviewed the radiological reports and agreed with the findings in the report.  ASSESSMENT AND PLAN:  Malignant neoplasm of upper-outer quadrant of left breast in female, estrogen receptor positive ([place]) 04/03/2018: Screening detected left breast mass 1.9 cm UOQ, ultrasound revealed 1.4 x 0.7 x 1.2 cm mass 2 o'clock position 8 cm from the nipple, 2 axillary lymph nodes with thickened cortices measuring 6 mm, Biopsy of the left breast mass IDC with mucinous features grade 1-2 with DCIS intermediate grade, lymph node benign, ER 90%, PR 0%, Ki-67 10%, HER-2 equivocal by IHC FISH negative ratio 1.13 copy #1.7, T1c N0 stage Ia  Pathology and radiology counseling:Discussed with the patient, the details of pathology including the type of breast cancer,the clinical staging, the significance of ER, PR and HER-2/neu receptors and the implications for treatment. After reviewing the pathology in detail, we proceeded to discuss the different treatment options between surgery, radiation, chemotherapy, antiestrogen therapies.  Recommendations: 1. Breast conserving surgery followed by 2. Oncotype DX testing: She is 76 and does look frail, therefore we may not need to do Oncotype testing since she may not be able to tolerate chemotherapy. 3. Adjuvant radiation therapy followed by 4. Adjuvant antiestrogen therapy  Return to clinic after surgery to discuss final pathology report  All questions were answered. The patient knows to call the clinic with any problems, questions or concerns.    Harriette Ohara, MD 04/30/18

## 2018-05-01 ENCOUNTER — Telehealth: Payer: Self-pay | Admitting: Hematology and Oncology

## 2018-05-01 ENCOUNTER — Encounter: Payer: Self-pay | Admitting: *Deleted

## 2018-05-01 ENCOUNTER — Telehealth: Payer: Self-pay

## 2018-05-01 DIAGNOSIS — B351 Tinea unguium: Secondary | ICD-10-CM | POA: Diagnosis not present

## 2018-05-01 DIAGNOSIS — L02612 Cutaneous abscess of left foot: Secondary | ICD-10-CM | POA: Diagnosis not present

## 2018-05-01 NOTE — Telephone Encounter (Signed)
No 1/29 los °

## 2018-05-01 NOTE — Telephone Encounter (Signed)
I contacted the patient and left a vm on mobile and home number to advise Dr. [name] had an appointment come available at 2pm today. Patient advised to call back by 1 pm to schedule if this would work for her schedule.

## 2018-05-08 ENCOUNTER — Other Ambulatory Visit: Payer: Self-pay | Admitting: General Surgery

## 2018-05-08 DIAGNOSIS — Z170 Estrogen receptor positive status [ER+]: Principal | ICD-10-CM

## 2018-05-08 DIAGNOSIS — C50412 Malignant neoplasm of upper-outer quadrant of left female breast: Secondary | ICD-10-CM

## 2018-05-09 ENCOUNTER — Other Ambulatory Visit: Payer: Self-pay | Admitting: General Surgery

## 2018-05-09 DIAGNOSIS — Z170 Estrogen receptor positive status [ER+]: Principal | ICD-10-CM

## 2018-05-09 DIAGNOSIS — C50412 Malignant neoplasm of upper-outer quadrant of left female breast: Secondary | ICD-10-CM

## 2018-05-10 ENCOUNTER — Telehealth: Payer: Self-pay | Admitting: Hematology and Oncology

## 2018-05-10 NOTE — Telephone Encounter (Signed)
Scheduled appt per 12/18 sch message -left vmail with appt date and time

## 2018-05-15 ENCOUNTER — Ambulatory Visit: Payer: Medicare Other | Admitting: Radiation Oncology

## 2018-05-15 ENCOUNTER — Ambulatory Visit: Payer: Medicare Other

## 2018-05-21 ENCOUNTER — Encounter: Payer: Self-pay | Admitting: Radiation Oncology

## 2018-05-21 NOTE — Progress Notes (Signed)
Location of Breast Cancer: Left Breast  Histology per Pathology Report:  04/03/18 Diagnosis 1. Breast, left, needle core biopsy, 2:00 - INVASIVE DUCTAL CARCINOMA WITH MUCINOUS FEATURES, GRADE I-II. SEE NOTE. - DUCTAL CARCINOMA IN SITU, INTERMEDIATE NUCLEAR GRADE 2. Lymph node, needle/core biopsy, left axilla - BENIGN LYMPH NODE.  Receptor Status: ER(90%), PR (NEG), Her2-neu (NEG), Ki-(10%)  Did patient present with symptoms or was this found on screening mammography?: It was found on a screening mammogram.   Past/Anticipated interventions by surgeon, if any: Dr. Dalbert Batman plans lumpectomy on 06/06/18   Past/Anticipated interventions by medical oncology, if any:  04/30/18 Dr. Lindi Adie Recommendations: 1. Breast conserving surgery followed by 2. Oncotype DX testing: She is 22 and does look frail, therefore we may not need to do Oncotype testing since she may not be able to tolerate chemotherapy. 3. Adjuvant radiation therapy followed by 4. Adjuvant antiestrogen therapy  Return to clinic after surgery to discuss final pathology report  Lymphedema issues, if any:  N/A  Pain issues, if any:  She denies.   SAFETY ISSUES:  Prior radiation? No  Pacemaker/ICD? No  Possible current pregnancy?: No  Is the patient on methotrexate? No  Current Complaints / other details:  She lives in [place]. She is legally blind and does not drive.  She is interested in maybe receiving radiation closer to home.   BP (!) 141/81   Pulse 75   Temp 97.8 F (36.6 C) (Oral)   Ht '5\' 7"'  (1.702 m)   Wt 204 lb 3.2 oz (92.6 kg)   SpO2 99% Comment: room air  BMI 31.98 kg/m    Wt Readings from Last 3 Encounters:  05/30/18 204 lb 3.2 oz (92.6 kg)  04/30/18 198 lb 9.6 oz (90.1 kg)  09/26/17 191 lb (86.6 kg)      Melissa Rosales, Stephani Police, RN 05/21/2018,10:28 AM

## 2018-05-23 HISTORY — PX: BREAST LUMPECTOMY: SHX2

## 2018-05-30 ENCOUNTER — Ambulatory Visit
Admission: RE | Admit: 2018-05-30 | Discharge: 2018-05-30 | Disposition: A | Discharge status: Home / Self Care | Payer: Medicare Other | Source: Ambulatory Visit | Attending: Radiation Oncology | Admitting: Radiation Oncology

## 2018-05-30 ENCOUNTER — Other Ambulatory Visit: Payer: Self-pay

## 2018-05-30 ENCOUNTER — Encounter: Payer: Self-pay | Admitting: Radiation Oncology

## 2018-05-30 DIAGNOSIS — Z9889 Other specified postprocedural states: Secondary | ICD-10-CM | POA: Diagnosis not present

## 2018-05-30 DIAGNOSIS — I10 Essential (primary) hypertension: Secondary | ICD-10-CM | POA: Diagnosis not present

## 2018-05-30 DIAGNOSIS — Z170 Estrogen receptor positive status [ER+]: Secondary | ICD-10-CM | POA: Insufficient documentation

## 2018-05-30 DIAGNOSIS — K219 Gastro-esophageal reflux disease without esophagitis: Secondary | ICD-10-CM | POA: Insufficient documentation

## 2018-05-30 DIAGNOSIS — Z7982 Long term (current) use of aspirin: Secondary | ICD-10-CM | POA: Diagnosis not present

## 2018-05-30 DIAGNOSIS — M1990 Unspecified osteoarthritis, unspecified site: Secondary | ICD-10-CM | POA: Insufficient documentation

## 2018-05-30 DIAGNOSIS — C773 Secondary and unspecified malignant neoplasm of axilla and upper limb lymph nodes: Secondary | ICD-10-CM | POA: Insufficient documentation

## 2018-05-30 DIAGNOSIS — C50412 Malignant neoplasm of upper-outer quadrant of left female breast: Secondary | ICD-10-CM | POA: Insufficient documentation

## 2018-05-30 DIAGNOSIS — Z79899 Other long term (current) drug therapy: Secondary | ICD-10-CM | POA: Insufficient documentation

## 2018-05-31 ENCOUNTER — Encounter (HOSPITAL_COMMUNITY)
Admission: RE | Admit: 2018-05-31 | Discharge: 2018-05-31 | Disposition: A | Discharge status: Home / Self Care | Payer: Medicare Other | Source: Ambulatory Visit | Attending: General Surgery | Admitting: General Surgery

## 2018-05-31 ENCOUNTER — Other Ambulatory Visit: Payer: Self-pay

## 2018-05-31 ENCOUNTER — Encounter (HOSPITAL_COMMUNITY): Payer: Self-pay

## 2018-05-31 DIAGNOSIS — Z01818 Encounter for other preprocedural examination: Secondary | ICD-10-CM | POA: Diagnosis not present

## 2018-05-31 HISTORY — DX: Malignant (primary) neoplasm, unspecified: C80.1

## 2018-05-31 LAB — COMPREHENSIVE METABOLIC PANEL
ALT: 19 U/L (ref 0–44)
AST: 23 U/L (ref 15–41)
Albumin: 4.3 g/dL (ref 3.5–5.0)
Alkaline Phosphatase: 46 U/L (ref 38–126)
Anion gap: 8 (ref 5–15)
BILIRUBIN TOTAL: 0.9 mg/dL (ref 0.3–1.2)
BUN: 14 mg/dL (ref 8–23)
CO2: 27 mmol/L (ref 22–32)
Calcium: 9.7 mg/dL (ref 8.9–10.3)
Chloride: 101 mmol/L (ref 98–111)
Creatinine, Ser: 1.03 mg/dL — ABNORMAL HIGH (ref 0.44–1.00)
GFR calc Af Amer: >60 mL/min (ref >60)
GFR calc non Af Amer: 55 mL/min — ABNORMAL LOW (ref >60)
GLUCOSE: 89 mg/dL (ref 70–99)
Potassium: 3.7 mmol/L (ref 3.5–5.1)
Sodium: 136 mmol/L (ref 135–145)
Total Protein: 8 g/dL (ref 6.5–8.1)

## 2018-05-31 LAB — CBC WITH DIFFERENTIAL/PLATELET
Abs Immature Granulocytes: 0.05 10*3/uL (ref 0.00–0.07)
Basophils Absolute: 0 10*3/uL (ref 0.0–0.1)
Basophils Relative: 1 %
Eosinophils Absolute: 0.1 10*3/uL (ref 0.0–0.5)
Eosinophils Relative: 1 %
HEMATOCRIT: 42.9 % (ref 36.0–46.0)
Hemoglobin: 13.1 g/dL (ref 12.0–15.0)
Immature Granulocytes: 1 %
Lymphocytes Relative: 33 %
Lymphs Abs: 2.5 10*3/uL (ref 0.7–4.0)
MCH: 27 pg (ref 26.0–34.0)
MCHC: 30.5 g/dL (ref 30.0–36.0)
MCV: 88.3 fL (ref 80.0–100.0)
Monocytes Absolute: 0.7 10*3/uL (ref 0.1–1.0)
Monocytes Relative: 9 %
NEUTROS ABS: 4.4 10*3/uL (ref 1.7–7.7)
Neutrophils Relative %: 55 %
Platelets: 205 10*3/uL (ref 150–400)
RBC: 4.86 MIL/uL (ref 3.87–5.11)
RDW: 14.1 % (ref 11.5–15.5)
WBC: 7.7 10*3/uL (ref 4.0–10.5)
nRBC: 0 % (ref 0.0–0.2)

## 2018-05-31 NOTE — Progress Notes (Signed)
PCP - Nicholes Mango Cardiologist - denies  Chest x-ray - not needed EKG - 05/31/18 Stress Test -denies  ECHO - denies Cardiac Cath - denies   Aspirin Instructions:stop 5 days  Anesthesia review: no  Patient denies shortness of breath, fever, cough and chest pain at PAT appointment   Patient verbalized understanding of instructions that were given to them at the PAT appointment. Patient was also instructed that they will need to review over the PAT instructions again at home before surgery.

## 2018-05-31 NOTE — Pre-Procedure Instructions (Signed)
Melissa Rosales  05/31/2018      [facility] - [place], [address] [place] [address] Phone: [phone] Fax: [phone]    Your procedure is scheduled on January 15  Report to [facility] Admitting at 0800 A.M.  Call this number if you have problems the morning of surgery:  [phone]   Remember:  Do not eat after midnight.  You may drink clear liquids until 0700 am .  Clear liquids allowed are: Water, Juice (non-citric and without pulp), Carbonated beverages, Clear Tea, Black Coffee only and Gatorade    Take these medicines the morning of surgery with A SIP OF WATER  amLODipine (NORVASC)  DULoxetine (CYMBALTA) gabapentin (NEURONTIN)  ranitidine (ZANTAC)    7 days prior to surgery STOP taking any Aspirin (unless otherwise instructed by your surgeon), Aleve, Naproxen, Ibuprofen, Motrin, Advil, Goody's, BC's, all herbal medications, fish oil, and all vitamins.  Follow your surgeon's instructions on when to stop Asprin.  If no instructions were given by your surgeon then you will need to call the office to get those instructions.     Do not wear jewelry, make-up or nail polish.  Do not wear lotions, powders, or perfumes, or deodorant.  Do not shave 48 hours prior to surgery.   Do not bring valuables to the hospital.  [facility] is not responsible for any belongings or valuables.  Contacts, dentures or bridgework may not be worn into surgery.  Leave your suitcase in the car.  After surgery it may be brought to your room.  For patients admitted to the hospital, discharge time will be determined by your treatment team.  Patients discharged the day of surgery will not be allowed to drive home.    Special instructions:   [facility]- Preparing For Surgery  Before surgery, you can play an important role. Because skin is not sterile, your skin needs to be as free of germs as possible. You can reduce the number of germs on  your skin by washing with CHG (chlorahexidine gluconate) Soap before surgery.  CHG is an antiseptic cleaner which kills germs and bonds with the skin to continue killing germs even after washing.    Oral Hygiene is also important to reduce your risk of infection.  Remember - BRUSH YOUR TEETH THE MORNING OF SURGERY WITH YOUR REGULAR TOOTHPASTE  Please do not use if you have an allergy to CHG or antibacterial soaps. If your skin becomes reddened/irritated stop using the CHG.  Do not shave (including legs and underarms) for at least 48 hours prior to first CHG shower. It is OK to shave your face.  Please follow these instructions carefully.   1. Shower the NIGHT BEFORE SURGERY and the MORNING OF SURGERY with CHG.   2. If you chose to wash your hair, wash your hair first as usual with your normal shampoo.  3. After you shampoo, rinse your hair and body thoroughly to remove the shampoo.  4. Use CHG as you would any other liquid soap. You can apply CHG directly to the skin and wash gently with a scrungie or a clean washcloth.   5. Apply the CHG Soap to your body ONLY FROM THE NECK DOWN.  Do not use on open wounds or open sores. Avoid contact with your eyes, ears, mouth and genitals (private parts). Wash Face and genitals (private parts)  with your normal soap.  6. Wash thoroughly, paying special attention  to the area where your surgery will be performed.  7. Thoroughly rinse your body with warm water from the neck down.  8. DO NOT shower/wash with your normal soap after using and rinsing off the CHG Soap.  9. Pat yourself dry with a CLEAN TOWEL.  10. Wear CLEAN PAJAMAS to bed the night before surgery, wear comfortable clothes the morning of surgery  11. Place CLEAN SHEETS on your bed the night of your first shower and DO NOT SLEEP WITH PETS.    Day of Surgery:  Do not apply any deodorants/lotions.  Please wear clean clothes to the hospital/surgery center.   Remember to brush your teeth  WITH YOUR REGULAR TOOTHPASTE.    Please read over the following fact sheets that you were given.

## 2018-06-01 ENCOUNTER — Encounter: Payer: Self-pay | Admitting: Radiation Oncology

## 2018-06-01 ENCOUNTER — Other Ambulatory Visit: Payer: Self-pay | Admitting: Radiation Oncology

## 2018-06-01 DIAGNOSIS — Z170 Estrogen receptor positive status [ER+]: Principal | ICD-10-CM

## 2018-06-01 DIAGNOSIS — C50412 Malignant neoplasm of upper-outer quadrant of left female breast: Secondary | ICD-10-CM

## 2018-06-01 NOTE — Progress Notes (Signed)
Radiation Oncology         [phone]) [phone] ________________________________  Initial outpatient Consultation  Name: Melissa Rosales MRN: [id number]  Date: 05/30/2018  DOB: [birth year]  CC:Redmond School, MD  Fanny Skates, MD   REFERRING PHYSICIAN: Fanny Skates, MD  DIAGNOSIS:    ICD-10-CM   1. Malignant neoplasm of upper-outer quadrant of left breast in female, estrogen receptor positive ([place]) C50.412    Z17.0    Cancer Staging Malignant neoplasm of upper-outer quadrant of left breast in female, estrogen receptor positive ([place]) Staging form: Breast, AJCC 8th Edition - Clinical stage from 04/30/2018: Stage IA (cT1c, cN0, cM0, G2, ER+, PR-, HER2-) - Signed by Nicholas Lose, MD on 04/30/2018   CHIEF COMPLAINT: Here to discuss management of left breast cancer  HISTORY OF PRESENT ILLNESS::Melissa Rosales is a 73 y.o. female who presented with breast abnormality on the following imaging: Screening mammography.  This revealed an abnormality in the left breast.  A 1.9 cm mass is appreciated in the upper outer quadrant. Symptoms, if any, at that time, were: None.   Ultrasound of breast  revealed a 1.4 cm mass in the left axilla with 2 lymph nodes with thickened cortices in the axilla on the left.   Biopsy of left breast mass showed ER positive PR negative HER-2 negative invasive ductal carcinoma, grade 1-2.  There was also DCIS in the specimen.  Biopsy of a lymph node was negative in the axilla.   Lumpectomy and SLN bx pending.  She is in her usual state of health.  She denies any pain.  Denies any lymphedema.  She is not able to drive due to blindness in 1 eye.  PREVIOUS RADIATION THERAPY: No  PAST MEDICAL HISTORY:  has a past medical history of Acid reflux, Arthritis, Cancer ([place]), Cervical myelopathy ([place]) (05/17/2017), and Hypertension.    PAST SURGICAL HISTORY: Past Surgical History:  Procedure Laterality Date  . ANTERIOR CERVICAL DECOMP/DISCECTOMY FUSION N/A 11/07/2016   Procedure:  ANTERIOR CERVICAL DECOMPRESSION/DISCECTOMY FUSION CERVICAL 4- CERVICAL 5;  Surgeon: Jovita Gamma, MD;  Location: [facility];  Service: Neurosurgery;  Laterality: N/A;  ANTERIOR CERVICAL DECOMPRESSION/DISCECTOMY FUSION CERVICAL 4- CERVICAL 5  . COLONOSCOPY    . EYE SURGERY Bilateral   . HARDWARE REMOVAL N/A 11/07/2016   Procedure: anterior cervical disectomy fusion cervical four-five (revision);  Surgeon: Jovita Gamma, MD;  Location: [facility];  Service: Neurosurgery;  Laterality: N/A;  . VAGINAL HYSTERECTOMY    . vision problems since birth      FAMILY HISTORY: family history includes Arthritis in an other family member; Depression in her sister; Heart disease in her mother.  SOCIAL HISTORY:  reports that she has never smoked. She has never used smokeless tobacco. She reports that she does not drink alcohol or use drugs.  ALLERGIES: Keflex [cephalexin]  MEDICATIONS:  Current Outpatient Medications  Medication Sig Dispense Refill  . amLODipine (NORVASC) 10 MG tablet Take 10 mg by mouth daily.     Marland Kitchen aspirin EC 81 MG tablet Take 81 mg by mouth daily.    . Calcium Carb-Cholecalciferol (CALCIUM + D3 PO) Take 1 tablet by mouth daily.     . clotrimazole-betamethasone (LOTRISONE) cream Apply 1 application topically 2 (two) times daily as needed (for irritation).     . Cyanocobalamin (VITAMIN B-12 PO) Take 1 tablet by mouth daily.    . DULoxetine (CYMBALTA) 30 MG capsule Take 1 tablet in the morning and 2 tablets at bedtime. (Patient taking differently: Take 30-60 mg by  mouth See admin instructions. Take 30 mg by mouth in the morning and take 60 mg by mouth at bedtime) 90 capsule 5  . enalapril (VASOTEC) 20 MG tablet Take 20 mg by mouth daily.     Marland Kitchen FIBER FORMULA CAPS Take 1 capsule by mouth daily.    Marland Kitchen gabapentin (NEURONTIN) 300 MG capsule 2 CAPSULES IN THE MORNING 1 IN THE AFTERNOON AND 2 CAPSULES IN THE EVENING. (Patient taking differently: Take 300-600 mg by mouth See admin instructions. Take 600  mg by mouth in the morning, take 300 mg by mouth in the afternoon and take 600 mg by mouth at bedtime) 150 capsule 5  . hydrochlorothiazide (HYDRODIURIL) 25 MG tablet Take 25 mg by mouth daily.    Marland Kitchen lactulose (CHRONULAC) 10 GM/15ML solution Take 10-20 g by mouth at bedtime as needed for moderate constipation.     . Multiple Vitamins-Minerals (MULTIVITAMIN WITH MINERALS) tablet Take 1 tablet by mouth daily.    . Omega 3 1000 MG CAPS Take 1,000 mg by mouth daily.     . potassium chloride (K-DUR) 10 MEQ tablet Take 10 mEq by mouth daily.    . ranitidine (ZANTAC) 150 MG tablet Take 150 mg by mouth daily.     . vitamin C (ASCORBIC ACID) 500 MG tablet Take 500 mg by mouth daily.     No current facility-administered medications for this encounter.     REVIEW OF SYSTEMS: A 10+ POINT REVIEW OF SYSTEMS WAS OBTAINED including neurology, dermatology, psychiatry, cardiac, respiratory, lymph, extremities, GI, GU, Musculoskeletal, constitutional, breasts, reproductive, HEENT.  All pertinent positives are noted in the HPI.  All others are negative.   PHYSICAL EXAM:  height is '5\' 7"'  (1.702 m) and weight is 204 lb 3.2 oz (92.6 kg). Her oral temperature is 97.8 F (36.6 C). Her blood pressure is 141/81 (abnormal) and her pulse is 75. Her oxygen saturation is 99%.   General: Alert and oriented, in no acute distress HEENT: Head is normocephalic. Left eye deviated laterally. Oropharynx is clear. Neck: Neck is supple, no palpable cervical or supraclavicular lymphadenopathy. Heart: Regular in rate and rhythm with no murmurs, rubs, or gallops. Chest: Clear to auscultation bilaterally, with no rhonchi, wheezes, or rales. Abdomen: Soft, nontender, nondistended, with no rigidity or guarding. Extremities: No cyanosis or edema. Lymphatics: see Neck Exam Musculoskeletal: symmetric strength and muscle tone throughout. Uses cane, needs help to get on exam table Neurologic: blind, left eye. Speech is fluent. No other  focalities appreciated Psychiatric: Judgment and insight are intact. Affect is appropriate. Breast exam reveals no obvious masses bilaterally, nor any masses in axillae  ECOG = 2  0 - Asymptomatic (Fully active, able to carry on all predisease activities without restriction)  1 - Symptomatic but completely ambulatory (Restricted in physically strenuous activity but ambulatory and able to carry out work of a light or sedentary nature. For example, light housework, office work)  2 - Symptomatic, <50% in bed during the day (Ambulatory and capable of all self care but unable to carry out any work activities. Up and about more than 50% of waking hours)  3 - Symptomatic, >50% in bed, but not bedbound (Capable of only limited self-care, confined to bed or chair 50% or more of waking hours)  4 - Bedbound (Completely disabled. Cannot carry on any self-care. Totally confined to bed or chair)  5 - Death   Eustace Pen MM, Creech RH, Tormey DC, et al. [phone]). "Toxicity and response criteria of the [employer]". [place] Oncol. 5 (6): 649-55   LABORATORY DATA:  Lab Results  Component Value Date   WBC 7.7 05/31/2018   HGB 13.1 05/31/2018   HCT 42.9 05/31/2018   MCV 88.3 05/31/2018   PLT 205 05/31/2018   CMP     Component Value Date/Time   NA 136 05/31/2018 1525   K 3.7 05/31/2018 1525   CL 101 05/31/2018 1525   CO2 27 05/31/2018 1525   GLUCOSE 89 05/31/2018 1525   BUN 14 05/31/2018 1525   CREATININE 1.03 (H) 05/31/2018 1525   CALCIUM 9.7 05/31/2018 1525   PROT 8.0 05/31/2018 1525   ALBUMIN 4.3 05/31/2018 1525   AST 23 05/31/2018 1525   ALT 19 05/31/2018 1525   ALKPHOS 46 05/31/2018 1525   BILITOT 0.9 05/31/2018 1525   GFRNONAA 55 (L) 05/31/2018 1525   GFRAA >60 05/31/2018 1525        RADIOGRAPHY:  As above  IMPRESSION/PLAN: Left breast cancer   It was a pleasure meeting the patient today. We discussed the risks, benefits, and side effects of radiotherapy.   I discussed the data regarding elderly patients with stage I ER positive breast cancer, published by Ysidro Evert at all in the [place] of [facility] years ago.  This study showed that antiestrogens following surgery do a good job at providing local control.  However adding radiotherapy to antiestrogens can improve the local control modestly.  Radiotherapy would be unlikely to improve her life expectancy.  The patient is enthusiastic about undergoing radiotherapy.  She is still undecided as to whether she would like to have it done here or at [facility] which is a little closer to home.  We talked about the option of having her treatment at [facility] and she will her surgeon know what she decides.  We discussed that radiation would take approximately 3-4 weeks to complete and that I would give the patient a few weeks to heal following surgery before starting treatment planning. We spoke about acute effects including skin irritation and fatigue as well as much less common late effects including internal organ injury or irritation. We spoke about the latest technology that is used to minimize the risk of late effects for patients undergoing radiotherapy to the breast or chest wall (such as the breathhold technique available here). No guarantees of treatment were given. The patient is enthusiastic about proceeding with treatment. I look forward to participating in the patient's care.  I will await her referral back to me for postoperative follow-up and eventual CT simulation/treatment planning. However, if she ultimately desires RT in [place], I recommend that the team refer her to Dr Francesca Jewett postoperatively and save her from an additional appointment here.  __________________________________________   Eppie Gibson, MD

## 2018-06-03 NOTE — H&P (Signed)
Melissa Rosales Location: [facility] Patient #: [id number] DOB: August 09, 1945 Separated / Language: Melissa Rosales / Race: Black or African American Female       History of Present Illness      . This is a 73 year old female, accompanied by her best friend. She is referred by Dr. Ike Rosales at the [facility] for new diagnosis cancer left breast upper outer quadrant. PCP listed is Melissa Rosales. Patient lives alone in [place].      She gets annual mammograms but has never had a breast problem. Recent imaging studies show a 1.9 cm mass in the left breast upper outer quadrant at 2:00 8 cm from the nipple. A couple of lymph nodes looked a little bit thickened. Image guided biopsy of the left breast mass shows invasive ductal carcinoma and DCIS, grade 1-2. ER 90%. PR 0. HER-2 negative. Ki-67 10% biopsy of one of the lymph nodes as normal lymphoid tissue and is apparently concordant     Comorbidities include BMI 34. Legally blind. Lives alone and ambulates with a cane. Has had cervical disc surgery. TAH and BSO. Hypertension. Denies diabetes heart disease or stroke Family history reveals maternal cousin had breast cancer. Denies family history of pancreatic ovarian or prostate cancer. She does not know the cause of her mother and father's death Social history reveals she is separated. No children. Lives in [place]. Doesn't drive. Legally blind. Denies alcohol or tobacco      I had a long discussion with her and her best friend who was present throughout the encounter. We talked about lumpectomy sentinel node biopsy radiation therapy mastectomy with or without reconstruction. The does not appear to be a survival advantage to mastectomy in her situation and I told her that. She would prefer to keep her breast and favors breast conservation. A total she is probably a good candidate for that. Tentative plan is left breast lumpectomy with radioactive seed localization and left  axillary sentinel lymph node biopsy I discussed the techniques and risk of that in some detail Before we go ahead with the surgery I wanted to refer her to medical oncology and radiation oncology to make sure we had assembled her team and created a comprehensive plan I gave her the choice of [place] or regional and she chose [facility]. These referrals were made  Plan: Refer Melissa Rosales Breast conference See me in 2 weeks to plan definitive surgery   Addendum Note Discussed in Breast Conference 04/25/2018. Consensus treatment plan is L.lumpectomy and SLN Followed by XRT and AE's Consider Oncotype  Addendum Note As patient is in favor of breast conservation and consensus recommendations have already been made, we will go ahead and schedule for surgery Plan injection of blue dye left breast, left breast lumpectomy with RSL, left axillary deep sentinel lymph node biopsy She does not have an appointment at the [facility] in total January 8 We will give them a call and see if we can move that up     Past Surgical History  Hysterectomy (not due to cancer) - Complete  Spinal Surgery - Neck   Diagnostic Studies History  Colonoscopy  5-10 years ago Mammogram  within last year Pap Smear  >5 years ago  Allergies  Cephalexin *CEPHALOSPORINS*  Allergies Reconciled   Medication History  raNITIdine HCl (150MG Tablet, Oral) Active. Potassium Chloride ER (10MEQ Tablet ER, Oral) Active. Meloxicam (7.5MG Tablet, Oral) Active. hydroCHLOROthiazide (25MG Tablet, Oral) Active. Gabapentin (300MG Capsule, Oral) Active. Furosemide (20MG Tablet, Oral)  Active. Enalapril Maleate (20MG Tablet, Oral) Active. DULoxetine HCl (30MG Capsule DR Part, Oral) Active. Clotrimazole-Betamethasone (1-0.05% Cream, External) Active. amLODIPine Besylate (10MG Tablet, Oral) Active. Medications Reconciled  Social History  Caffeine use  Carbonated beverages, Coffee. No  alcohol use  No drug use  Tobacco use  Never smoker.  Family History  Arthritis  Mother. Cerebrovascular Accident  Mother. Heart Disease  Mother. Hypertension  Mother.  Pregnancy / Birth History  Age at menarche  23 years. Age of menopause  <46 Gravida  0 Para  0  Other Problems  Arthritis  Breast Cancer  Gastroesophageal Reflux Disease  High blood pressure     Review of Systems General Not Present- Appetite Loss, Chills, Fatigue, Fever, Night Sweats, Weight Gain and Weight Loss. Skin Not Present- Change in Wart/Mole, Dryness, Hives, Jaundice, New Lesions, Non-Healing Wounds, Rash and Ulcer. HEENT Present- Hearing Loss, Ringing in the Ears, Visual Disturbances and Wears glasses/contact lenses. Not Present- Earache, Hoarseness, Nose Bleed, Oral Ulcers, Seasonal Allergies, Sinus Pain, Sore Throat and Yellow Eyes. Respiratory Present- Snoring. Not Present- Bloody sputum, Chronic Cough, Difficulty Breathing and Wheezing. Breast Not Present- Breast Mass, Breast Pain, Nipple Discharge and Skin Changes. Cardiovascular Not Present- Chest Pain, Difficulty Breathing Lying Down, Leg Cramps, Palpitations, Rapid Heart Rate, Shortness of Breath and Swelling of Extremities. Gastrointestinal Present- Constipation. Not Present- Abdominal Pain, Bloating, Bloody Stool, Change in Bowel Habits, Chronic diarrhea, Difficulty Swallowing, Excessive gas, Gets full quickly at meals, Hemorrhoids, Indigestion, Nausea, Rectal Pain and Vomiting. Female Genitourinary Not Present- Frequency, Nocturia, Painful Urination, Pelvic Pain and Urgency. Musculoskeletal Present- Joint Stiffness. Not Present- Back Pain, Joint Pain, Muscle Pain, Muscle Weakness and Swelling of Extremities. Neurological Present- Numbness, Tingling, Trouble walking and Weakness. Not Present- Decreased Memory, Fainting, Headaches, Seizures and Tremor. Psychiatric Not Present- Anxiety, Bipolar, Change in Sleep Pattern, Depression,  Fearful and Frequent crying. Endocrine Not Present- Cold Intolerance, Excessive Hunger, Hair Changes, Heat Intolerance, Hot flashes and New Diabetes. Hematology Present- Blood Thinners. Not Present- Easy Bruising, Excessive bleeding, Gland problems, HIV and Persistent Infections.  Vitals  Weight: 203 lb Height: 64in Body Surface Area: 1.97 m Body Mass Index: 34.84 kg/m  Temp.: 97.38F  Pulse: 76 (Regular)  BP: 132/76 (Sitting, Left Arm, Standard)       Physical Exam  General Mental Status-Alert. General Appearance-Consistent with stated age. Hydration-Well hydrated. Voice-Normal. Note: Ambulates with a cane.   Head and Neck Head-normocephalic, atraumatic with no lesions or palpable masses. Trachea-midline. Thyroid Gland Characteristics - normal size and consistency.  Eye Eyeball - Bilateral-Extraocular movements intact. Sclera/Conjunctiva - Bilateral-No scleral icterus. Note: Disconjugate gaze   Chest and Lung Exam Chest and lung exam reveals -quiet, even and easy respiratory effort with no use of accessory muscles and on auscultation, normal breath sounds, no adventitious sounds and normal vocal resonance. Inspection Chest Wall - Normal. Back - normal.  Breast Note: Large. Ptotic breast. No palpable mass in either breast. No axillary adenopathy.   Cardiovascular Cardiovascular examination reveals -normal heart sounds, regular rate and rhythm with no murmurs and normal pedal pulses bilaterally.  Abdomen Inspection Inspection of the abdomen reveals - No Hernias. Skin - Scar - no surgical scars. Palpation/Percussion Palpation and Percussion of the abdomen reveal - Soft, Non Tender, No Rebound tenderness, No Rigidity (guarding) and No hepatosplenomegaly. Auscultation Auscultation of the abdomen reveals - Bowel sounds normal.  Neurologic Neurologic evaluation reveals -alert and oriented x 3 with no impairment of recent or  remote memory. Mental Status-Normal.  Neuropsychiatric Note: Good insight.  Understands what is going on. Sometimes a little childlike.   Musculoskeletal Normal Exam - Left-Upper Extremity Strength Normal and Lower Extremity Strength Normal. Normal Exam - Right-Upper Extremity Strength Normal and Lower Extremity Strength Normal.  Lymphatic Head & Neck  General Head & Neck Lymphatics: Bilateral - Description - Normal. Axillary  General Axillary Region: Bilateral - Description - Normal. Tenderness - Non Tender. Femoral & Inguinal  Generalized Femoral & Inguinal Lymphatics: Bilateral - Description - Normal. Tenderness - Non Tender.    Assessment & Plan PRIMARY CANCER OF UPPER OUTER QUADRANT OF LEFT FEMALE BREAST (C50.412)  We met with you and your best friend today. Your recent mammograms and biopsy show a cancer in the upper outer quadrant of the left breast This cancer appears to be a solitary cancer measuring 1.9 cm in size Your lymph nodes under your arm looked a little bit abnormal but the biopsy showed completely benign lymph nodes The breast cancer is sensitive to estrogen  you will need an operation to remove the cancer from your breast and to sample your lymph nodes We talked about lumpectomy and sentinel lymph node biopsy and radiation therapy. We compared that to mastectomy. Your preference is to keep your breast if possible and I think you are probably a good candidate for that  Before we do any surgery he will be referred to medical oncology and radiation oncology to discuss the other aspects a treatment It was your preference to see doctors in [place] rather than reasonable You'll be referred to the [facility] for preop consultation with medical oncology and radiation oncology  Return to see Dr. Dalbert Batman in 2 weeks At that time we will most likely schedule you for definitive surgery Our tentative plan is left breast lumpectomy with  radioactive seed localization and left axillary sentinel lymph node biopsy  LEGALLY BLIND (H54.8) HYPERTENSION, BENIGN (I10) HISTORY OF TOTAL ABDOMINAL HYSTERECTOMY AND BILATERAL SALPINGO-OOPHORECTOMY (Z90.710) BMI 34.0-34.9,ADULT (Z68.34)   Edsel Petrin. Dalbert Batman, M.D., [facility] General and Minimally invasive Surgery Breast and Colorectal Surgery Office:   [phone] Pager:   [phone]

## 2018-06-05 ENCOUNTER — Ambulatory Visit
Admission: RE | Admit: 2018-06-05 | Discharge: 2018-06-05 | Disposition: A | Discharge status: Home / Self Care | Payer: Medicare Other | Source: Ambulatory Visit | Attending: General Surgery | Admitting: General Surgery

## 2018-06-05 ENCOUNTER — Telehealth: Payer: Self-pay | Admitting: *Deleted

## 2018-06-05 DIAGNOSIS — C50412 Malignant neoplasm of upper-outer quadrant of left female breast: Secondary | ICD-10-CM

## 2018-06-05 DIAGNOSIS — Z170 Estrogen receptor positive status [ER+]: Principal | ICD-10-CM

## 2018-06-05 DIAGNOSIS — D0512 Intraductal carcinoma in situ of left breast: Secondary | ICD-10-CM | POA: Diagnosis not present

## 2018-06-05 NOTE — Telephone Encounter (Signed)
CALLED SOCIAL WORK TO MAKE AWARE OF SOCIAL WORK REFERRAL, LVM

## 2018-06-06 ENCOUNTER — Encounter (HOSPITAL_COMMUNITY)
Admission: RE | Admit: 2018-06-06 | Discharge: 2018-06-06 | Disposition: A | Discharge status: Home / Self Care | Payer: Medicare Other | Source: Ambulatory Visit | Attending: General Surgery | Admitting: General Surgery

## 2018-06-06 ENCOUNTER — Other Ambulatory Visit: Payer: Self-pay

## 2018-06-06 ENCOUNTER — Encounter (HOSPITAL_COMMUNITY): Payer: Self-pay

## 2018-06-06 ENCOUNTER — Ambulatory Visit (HOSPITAL_COMMUNITY): Payer: Medicare Other | Admitting: Certified Registered Nurse Anesthetist

## 2018-06-06 ENCOUNTER — Ambulatory Visit (HOSPITAL_COMMUNITY)
Admission: RE | Admit: 2018-06-06 | Discharge: 2018-06-06 | Disposition: A | Discharge status: Home / Self Care | Payer: Medicare Other | Attending: General Surgery | Admitting: General Surgery

## 2018-06-06 ENCOUNTER — Ambulatory Visit
Admission: RE | Admit: 2018-06-06 | Discharge: 2018-06-06 | Disposition: A | Discharge status: Home / Self Care | Payer: Medicare Other | Source: Ambulatory Visit | Attending: General Surgery | Admitting: General Surgery

## 2018-06-06 ENCOUNTER — Encounter: Payer: Self-pay | Admitting: General Practice

## 2018-06-06 ENCOUNTER — Encounter (HOSPITAL_COMMUNITY)
Admission: RE | Disposition: A | Discharge status: Home / Self Care | Payer: Self-pay | Source: Home / Self Care | Attending: General Surgery

## 2018-06-06 DIAGNOSIS — Z170 Estrogen receptor positive status [ER+]: Principal | ICD-10-CM

## 2018-06-06 DIAGNOSIS — I10 Essential (primary) hypertension: Secondary | ICD-10-CM | POA: Diagnosis not present

## 2018-06-06 DIAGNOSIS — C50412 Malignant neoplasm of upper-outer quadrant of left female breast: Secondary | ICD-10-CM

## 2018-06-06 DIAGNOSIS — Z881 Allergy status to other antibiotic agents status: Secondary | ICD-10-CM | POA: Insufficient documentation

## 2018-06-06 DIAGNOSIS — K219 Gastro-esophageal reflux disease without esophagitis: Secondary | ICD-10-CM | POA: Insufficient documentation

## 2018-06-06 DIAGNOSIS — H548 Legal blindness, as defined in USA: Secondary | ICD-10-CM | POA: Diagnosis not present

## 2018-06-06 DIAGNOSIS — Z79899 Other long term (current) drug therapy: Secondary | ICD-10-CM | POA: Insufficient documentation

## 2018-06-06 DIAGNOSIS — G8918 Other acute postprocedural pain: Secondary | ICD-10-CM | POA: Diagnosis not present

## 2018-06-06 DIAGNOSIS — Z791 Long term (current) use of non-steroidal anti-inflammatories (NSAID): Secondary | ICD-10-CM | POA: Diagnosis not present

## 2018-06-06 DIAGNOSIS — C50912 Malignant neoplasm of unspecified site of left female breast: Secondary | ICD-10-CM | POA: Diagnosis not present

## 2018-06-06 DIAGNOSIS — R928 Other abnormal and inconclusive findings on diagnostic imaging of breast: Secondary | ICD-10-CM | POA: Diagnosis not present

## 2018-06-06 DIAGNOSIS — M1990 Unspecified osteoarthritis, unspecified site: Secondary | ICD-10-CM | POA: Insufficient documentation

## 2018-06-06 HISTORY — PX: BREAST LUMPECTOMY WITH RADIOACTIVE SEED AND SENTINEL LYMPH NODE BIOPSY: SHX6550

## 2018-06-06 SURGERY — BREAST LUMPECTOMY WITH RADIOACTIVE SEED AND SENTINEL LYMPH NODE BIOPSY
Anesthesia: Regional | Site: Breast | Laterality: Left

## 2018-06-06 MED ORDER — CEFAZOLIN SODIUM-DEXTROSE 2-4 GM/100ML-% IV SOLN
INTRAVENOUS | Status: AC
  Filled 2018-06-06: qty 100

## 2018-06-06 MED ORDER — LACTATED RINGERS IV SOLN
INTRAVENOUS | Status: DC
  Administered 2018-06-06 (×2): via INTRAVENOUS

## 2018-06-06 MED ORDER — CLONIDINE HCL (ANALGESIA) 100 MCG/ML EP SOLN
EPIDURAL | Status: DC | PRN
  Administered 2018-06-06: 50 ug

## 2018-06-06 MED ORDER — LACTATED RINGERS IV SOLN: INTRAVENOUS | Status: DC

## 2018-06-06 MED ORDER — SODIUM CHLORIDE 0.9% FLUSH: 3.0000 mL | INTRAVENOUS | Status: DC | PRN

## 2018-06-06 MED ORDER — BUPIVACAINE-EPINEPHRINE 0.25% -1:200000 IJ SOLN
INTRAMUSCULAR | Status: DC | PRN
  Administered 2018-06-06: 15 mL

## 2018-06-06 MED ORDER — CEFAZOLIN SODIUM-DEXTROSE 2-4 GM/100ML-% IV SOLN
2.0000 g | INTRAVENOUS | Status: AC
  Administered 2018-06-06: 2 g via INTRAVENOUS

## 2018-06-06 MED ORDER — BUPIVACAINE HCL (PF) 0.5 % IJ SOLN
INTRAMUSCULAR | Status: DC | PRN
  Administered 2018-06-06: 30 mL via PERINEURAL

## 2018-06-06 MED ORDER — PHENYLEPHRINE HCL 10 MG/ML IJ SOLN
INTRAMUSCULAR | Status: DC | PRN
  Administered 2018-06-06: 200 ug via INTRAVENOUS
  Administered 2018-06-06 (×2): 80 ug via INTRAVENOUS
  Administered 2018-06-06 (×3): 120 ug via INTRAVENOUS

## 2018-06-06 MED ORDER — ACETAMINOPHEN 650 MG RE SUPP: 650.0000 mg | RECTAL | Status: DC | PRN

## 2018-06-06 MED ORDER — FENTANYL CITRATE (PF) 100 MCG/2ML IJ SOLN: 25.0000 ug | INTRAMUSCULAR | Status: DC | PRN

## 2018-06-06 MED ORDER — HYDROCODONE-ACETAMINOPHEN 5-325 MG PO TABS: 1.0000 | ORAL_TABLET | Freq: Four times a day (QID) | ORAL | 0 refills | Status: DC | PRN

## 2018-06-06 MED ORDER — ACETAMINOPHEN 325 MG PO TABS: 650.0000 mg | ORAL_TABLET | ORAL | Status: DC | PRN

## 2018-06-06 MED ORDER — FENTANYL CITRATE (PF) 100 MCG/2ML IJ SOLN
INTRAMUSCULAR | Status: AC
  Administered 2018-06-06: 50 ug
  Filled 2018-06-06: qty 2

## 2018-06-06 MED ORDER — SODIUM CHLORIDE 0.9 % IV SOLN: 250.0000 mL | INTRAVENOUS | Status: DC | PRN

## 2018-06-06 MED ORDER — CHLORHEXIDINE GLUCONATE CLOTH 2 % EX PADS: 6.0000 | MEDICATED_PAD | Freq: Once | CUTANEOUS | Status: DC

## 2018-06-06 MED ORDER — EPHEDRINE SULFATE 50 MG/ML IJ SOLN
INTRAMUSCULAR | Status: DC | PRN
  Administered 2018-06-06: 10 mg via INTRAVENOUS
  Administered 2018-06-06: 5 mg via INTRAVENOUS
  Administered 2018-06-06: 10 mg via INTRAVENOUS
  Administered 2018-06-06: 5 mg via INTRAVENOUS
  Administered 2018-06-06 (×2): 10 mg via INTRAVENOUS

## 2018-06-06 MED ORDER — SODIUM CHLORIDE 0.9% FLUSH: 3.0000 mL | Freq: Two times a day (BID) | INTRAVENOUS | Status: DC

## 2018-06-06 MED ORDER — LIDOCAINE 2% (20 MG/ML) 5 ML SYRINGE
INTRAMUSCULAR | Status: DC | PRN
  Administered 2018-06-06: 100 mg via INTRAVENOUS

## 2018-06-06 MED ORDER — FENTANYL CITRATE (PF) 250 MCG/5ML IJ SOLN
INTRAMUSCULAR | Status: AC
  Filled 2018-06-06: qty 5

## 2018-06-06 MED ORDER — OXYCODONE HCL 5 MG PO TABS: 5.0000 mg | ORAL_TABLET | ORAL | Status: DC | PRN

## 2018-06-06 MED ORDER — ONDANSETRON HCL 4 MG/2ML IJ SOLN
INTRAMUSCULAR | Status: AC
  Filled 2018-06-06: qty 2

## 2018-06-06 MED ORDER — ACETAMINOPHEN 500 MG PO TABS
1000.0000 mg | ORAL_TABLET | ORAL | Status: AC
  Administered 2018-06-06: 1000 mg via ORAL
  Filled 2018-06-06: qty 2

## 2018-06-06 MED ORDER — SODIUM CHLORIDE (PF) 0.9 % IJ SOLN
INTRAVENOUS | Status: DC | PRN
  Administered 2018-06-06: 12:00:00

## 2018-06-06 MED ORDER — TECHNETIUM TC 99M SULFUR COLLOID FILTERED
1.0000 | Freq: Once | INTRAVENOUS | Status: AC | PRN
  Administered 2018-06-06: 1 via INTRADERMAL

## 2018-06-06 MED ORDER — MIDAZOLAM HCL 2 MG/2ML IJ SOLN
INTRAMUSCULAR | Status: AC
  Filled 2018-06-06: qty 2

## 2018-06-06 MED ORDER — CELECOXIB 100 MG PO CAPS
100.0000 mg | ORAL_CAPSULE | ORAL | Status: AC
  Administered 2018-06-06: 100 mg via ORAL
  Filled 2018-06-06 (×2): qty 1

## 2018-06-06 MED ORDER — SODIUM CHLORIDE FLUSH 0.9 % IV SOLN
INTRAVENOUS | Status: AC
  Filled 2018-06-06: qty 10

## 2018-06-06 MED ORDER — PROPOFOL 10 MG/ML IV BOLUS
INTRAVENOUS | Status: DC | PRN
  Administered 2018-06-06: 100 mg via INTRAVENOUS

## 2018-06-06 MED ORDER — METHYLENE BLUE 0.5 % INJ SOLN
INTRAVENOUS | Status: AC
  Filled 2018-06-06: qty 10

## 2018-06-06 MED ORDER — LIDOCAINE 2% (20 MG/ML) 5 ML SYRINGE
INTRAMUSCULAR | Status: AC
  Filled 2018-06-06: qty 5

## 2018-06-06 MED ORDER — ACETAMINOPHEN 500 MG PO TABS: 1000.0000 mg | ORAL_TABLET | Freq: Four times a day (QID) | ORAL | Status: DC

## 2018-06-06 MED ORDER — ONDANSETRON HCL 4 MG/2ML IJ SOLN
INTRAMUSCULAR | Status: DC | PRN
  Administered 2018-06-06: 4 mg via INTRAVENOUS

## 2018-06-06 MED ORDER — PROPOFOL 10 MG/ML IV BOLUS
INTRAVENOUS | Status: AC
  Filled 2018-06-06: qty 20

## 2018-06-06 MED ORDER — 0.9 % SODIUM CHLORIDE (POUR BTL) OPTIME
TOPICAL | Status: DC | PRN
  Administered 2018-06-06: 1000 mL

## 2018-06-06 MED ORDER — DEXAMETHASONE SODIUM PHOSPHATE 10 MG/ML IJ SOLN
INTRAMUSCULAR | Status: DC | PRN
  Administered 2018-06-06: 10 mg via INTRAVENOUS

## 2018-06-06 MED ORDER — GABAPENTIN 300 MG PO CAPS
300.0000 mg | ORAL_CAPSULE | ORAL | Status: DC
  Filled 2018-06-06: qty 1

## 2018-06-06 MED ORDER — BUPIVACAINE-EPINEPHRINE (PF) 0.25% -1:200000 IJ SOLN
INTRAMUSCULAR | Status: AC
  Filled 2018-06-06: qty 30

## 2018-06-06 MED ORDER — MIDAZOLAM HCL 2 MG/2ML IJ SOLN
INTRAMUSCULAR | Status: AC
  Administered 2018-06-06: 2 mg
  Filled 2018-06-06: qty 2

## 2018-06-06 SURGICAL SUPPLY — 48 items
ADH SKN CLS APL DERMABOND .7 (GAUZE/BANDAGES/DRESSINGS) ×1
APPLIER CLIP 9.375 MED OPEN (MISCELLANEOUS) ×3
APR CLP MED 9.3 20 MLT OPN (MISCELLANEOUS) ×1
BINDER BREAST LRG (GAUZE/BANDAGES/DRESSINGS) IMPLANT
BINDER BREAST XLRG (GAUZE/BANDAGES/DRESSINGS) IMPLANT
BINDER BREAST XXLRG (GAUZE/BANDAGES/DRESSINGS) ×2 IMPLANT
CANISTER SUCT 3000ML PPV (MISCELLANEOUS) ×3 IMPLANT
CHLORAPREP W/TINT 26ML (MISCELLANEOUS) ×3 IMPLANT
CLIP APPLIE 9.375 MED OPEN (MISCELLANEOUS) ×1 IMPLANT
CONT SPEC 4OZ CLIKSEAL STRL BL (MISCELLANEOUS) ×3 IMPLANT
COVER PROBE W GEL 5X96 (DRAPES) ×3 IMPLANT
COVER SURGICAL LIGHT HANDLE (MISCELLANEOUS) ×3 IMPLANT
COVER WAND RF STERILE (DRAPES) ×3 IMPLANT
DERMABOND ADVANCED (GAUZE/BANDAGES/DRESSINGS) ×2
DERMABOND ADVANCED .7 DNX12 (GAUZE/BANDAGES/DRESSINGS) ×1 IMPLANT
DEVICE DUBIN SPECIMEN MAMMOGRA (MISCELLANEOUS) ×3 IMPLANT
DRAPE CHEST BREAST 15X10 FENES (DRAPES) ×3 IMPLANT
DRAPE HALF SHEET 40X57 (DRAPES) ×3 IMPLANT
DRSG PAD ABDOMINAL 8X10 ST (GAUZE/BANDAGES/DRESSINGS) ×3 IMPLANT
ELECT CAUTERY BLADE 6.4 (BLADE) ×3 IMPLANT
ELECT REM PT RETURN 9FT ADLT (ELECTROSURGICAL) ×3
ELECTRODE REM PT RTRN 9FT ADLT (ELECTROSURGICAL) ×1 IMPLANT
FILTER STRAW FLUID ASPIR (MISCELLANEOUS) IMPLANT
GAUZE SPONGE 4X4 12PLY STRL (GAUZE/BANDAGES/DRESSINGS) ×3 IMPLANT
GLOVE EUDERMIC 7 POWDERFREE (GLOVE) ×3 IMPLANT
GOWN STRL REUS W/ TWL LRG LVL3 (GOWN DISPOSABLE) ×1 IMPLANT
GOWN STRL REUS W/ TWL XL LVL3 (GOWN DISPOSABLE) ×1 IMPLANT
GOWN STRL REUS W/TWL LRG LVL3 (GOWN DISPOSABLE) ×3
GOWN STRL REUS W/TWL XL LVL3 (GOWN DISPOSABLE) ×3
KIT BASIN OR (CUSTOM PROCEDURE TRAY) ×3 IMPLANT
KIT MARKER MARGIN INK (KITS) ×3 IMPLANT
NDL FILTER BLUNT 18X1 1/2 (NEEDLE) IMPLANT
NDL HYPO 25GX1X1/2 BEV (NEEDLE) ×1 IMPLANT
NDL SAFETY ECLIPSE 18X1.5 (NEEDLE) IMPLANT
NEEDLE FILTER BLUNT 18X 1/2SAF (NEEDLE) ×2
NEEDLE FILTER BLUNT 18X1 1/2 (NEEDLE) ×1 IMPLANT
NEEDLE HYPO 18GX1.5 SHARP (NEEDLE)
NEEDLE HYPO 25GX1X1/2 BEV (NEEDLE) ×6 IMPLANT
NS IRRIG 1000ML POUR BTL (IV SOLUTION) ×3 IMPLANT
PACK GENERAL/GYN (CUSTOM PROCEDURE TRAY) ×3 IMPLANT
PAD ABD 8X10 STRL (GAUZE/BANDAGES/DRESSINGS) ×4 IMPLANT
SPONGE LAP 4X18 RFD (DISPOSABLE) ×3 IMPLANT
SUT MNCRL AB 4-0 PS2 18 (SUTURE) ×8 IMPLANT
SUT SILK 2 0 SH (SUTURE) ×3 IMPLANT
SUT VIC AB 3-0 SH 18 (SUTURE) ×3 IMPLANT
SYR CONTROL 10ML LL (SYRINGE) ×3 IMPLANT
TOWEL OR 17X24 6PK STRL BLUE (TOWEL DISPOSABLE) ×3 IMPLANT
TOWEL OR 17X26 10 PK STRL BLUE (TOWEL DISPOSABLE) ×3 IMPLANT

## 2018-06-06 NOTE — Anesthesia Postprocedure Evaluation (Signed)
Anesthesia Post Note  Patient: Leda E Boivin  Procedure(s) Performed: LEFT BREAST LUMPECTOMY WITH RADIOACTIVE SEED AND AXILLARY DEEP SENTINEL LYMPH NODE BIOPSY WITH BLUE DYE INJECTION (Left Breast)     Patient location during evaluation: PACU Anesthesia Type: Regional and General Level of consciousness: awake and alert Pain management: pain level controlled Vital Signs Assessment: post-procedure vital signs reviewed and stable Respiratory status: spontaneous breathing, nonlabored ventilation, respiratory function stable and patient connected to nasal cannula oxygen Cardiovascular status: blood pressure returned to baseline and stable Postop Assessment: no apparent nausea or vomiting Anesthetic complications: no    Last Vitals:  Vitals:   06/06/18 1359 06/06/18 1414  BP: 95/61 99/61  Pulse:    Resp: 15 16  Temp:    SpO2:  100%    Last Pain:  Vitals:   06/06/18 1414  TempSrc:   PainSc: 0-No pain                 Florella Mcneese L Tranae [place]

## 2018-06-06 NOTE — Anesthesia Procedure Notes (Signed)
Anesthesia Regional Block: Pectoralis block   Pre-Anesthetic Checklist: ,, timeout performed, Correct Patient, Correct Site, Correct Laterality, Correct Procedure, Correct Position, site marked, Risks and benefits discussed,  Surgical consent,  Pre-op evaluation,  At surgeon's request and post-op pain management  Laterality: Left  Prep: Maximum Sterile Barrier Precautions used, chloraprep       Needles:  Injection technique: Single-shot  Needle Type: Echogenic Stimulator Needle     Needle Length: 9cm  Needle Gauge: 22     Additional Needles:   Procedures:,,,, ultrasound used (permanent image in chart),,,,  Narrative:  Start time: 06/06/2018 10:55 AM End time: 06/06/2018 11:05 AM Injection made incrementally with aspirations every 5 mL.  Performed by: Personally  Anesthesiologist: Freddrick March, MD  Additional Notes: Monitors applied. No increased pain on injection. No increased resistance to injection. Injection made in 5cc increments. Good needle visualization. Patient tolerated procedure well.

## 2018-06-06 NOTE — Progress Notes (Signed)
Verified with Dr. Dalbert Batman at the bedside that Ancef is ok to give despite pt's N/V reaction to Keflex. Pt denied having a rash,  tongue swelling or difficulty breathing when on keflex.   Jacqlyn Larsen, RN

## 2018-06-06 NOTE — Interval H&P Note (Signed)
History and Physical Interval Note:  06/06/2018 9:09 AM  Melissa Rosales  has presented today for surgery, with the diagnosis of LEFT BREAST CANCER  The various methods of treatment have been discussed with the patient and family. After consideration of risks, benefits and other options for treatment, the patient has consented to  Procedure(s): LEFT BREAST LUMPECTOMY WITH RADIOACTIVE SEED AND AXILLARY DEEP SENTINEL LYMPH NODE BIOPSY WITH BLUE DYE INJECTION (Left) as a surgical intervention .  The patient's history has been reviewed, patient examined, no change in status, stable for surgery.  I have reviewed the patient's chart and labs.  Questions were answered to the patient's satisfaction.     Adin Hector

## 2018-06-06 NOTE — Anesthesia Preprocedure Evaluation (Addendum)
Anesthesia Evaluation  Patient identified by MRN, date of birth, ID band Patient awake    Reviewed: Allergy & Precautions, NPO status , Patient's Chart, lab work & pertinent test results  Airway Mallampati: III  TM Distance: >3 FB Neck ROM: Full    Dental no notable dental hx. (+) Dental Advisory Given, Teeth Intact   Pulmonary neg pulmonary ROS,    Pulmonary exam normal breath sounds clear to auscultation       Cardiovascular hypertension, negative cardio ROS Normal cardiovascular exam Rhythm:Regular Rate:Normal     Neuro/Psych negative neurological ROS  negative psych ROS   GI/Hepatic negative GI ROS, Neg liver ROS, GERD  Medicated,  Endo/Other  negative endocrine ROS  Renal/GU negative Renal ROS  negative genitourinary   Musculoskeletal negative musculoskeletal ROS (+)   Abdominal   Peds  Hematology negative hematology ROS (+)   Anesthesia Other Findings Day of surgery medications reviewed with the patient.  Reproductive/Obstetrics                            Anesthesia Physical Anesthesia Plan  ASA: II  Anesthesia Plan: General and Regional   Post-op Pain Management:  Regional for Post-op pain   Induction: Intravenous  PONV Risk Score and Plan: 3 and Dexamethasone, Ondansetron and Treatment may vary due to age or medical condition  Airway Management Planned: Oral ETT and LMA  Additional Equipment:   Intra-op Plan:   Post-operative Plan: Extubation in OR  Informed Consent: I have reviewed the patients History and Physical, chart, labs and discussed the procedure including the risks, benefits and alternatives for the proposed anesthesia with the patient or authorized representative who has indicated his/her understanding and acceptance.     Dental advisory given  Plan Discussed with: CRNA  Anesthesia Plan Comments:         Anesthesia Quick Evaluation

## 2018-06-06 NOTE — Anesthesia Procedure Notes (Signed)
Procedure Name: LMA Insertion Date/Time: 06/06/2018 10:30 AM Performed by: Bryson Corona, CRNA Pre-anesthesia Checklist: Patient identified, Emergency Drugs available, Suction available and Patient being monitored Patient Re-evaluated:Patient Re-evaluated prior to induction Oxygen Delivery Method: Circle System Utilized Preoxygenation: Pre-oxygenation with 100% oxygen Induction Type: IV induction Ventilation: Mask ventilation without difficulty LMA: LMA inserted LMA Size: 4.0 Number of attempts: 1 Placement Confirmation: positive ETCO2 Tube secured with: Tape Dental Injury: Teeth and Oropharynx as per pre-operative assessment

## 2018-06-06 NOTE — Transfer of Care (Signed)
Immediate Anesthesia Transfer of Care Note  Patient: Melissa Rosales  Procedure(s) Performed: LEFT BREAST LUMPECTOMY WITH RADIOACTIVE SEED AND AXILLARY DEEP SENTINEL LYMPH NODE BIOPSY WITH BLUE DYE INJECTION (Left Breast)  Patient Location: PACU  Anesthesia Type:General  Level of Consciousness: drowsy  Airway & Oxygen Therapy: Patient Spontanous Breathing and Patient connected to nasal cannula oxygen  Post-op Assessment: Report given to RN and Post -op Vital signs reviewed and stable  Post vital signs: Reviewed and stable  Last Vitals:  Vitals Value Taken Time  BP 115/69 06/06/2018 11:59 AM  Temp    Pulse 70 06/06/2018 11:59 AM  Resp 19 06/06/2018 11:59 AM  SpO2 99 % 06/06/2018 11:59 AM  Vitals shown include unvalidated device data.  Last Pain:  Vitals:   06/06/18 0858  TempSrc:   PainSc: 0-No pain      Patients Stated Pain Goal: 3 (88/41/66 0630)  Complications: No apparent anesthesia complications

## 2018-06-06 NOTE — Discharge Instructions (Signed)
[facility] °Office Phone Number [phone] ° °BREAST BIOPSY/ PARTIAL MASTECTOMY: POST OP INSTRUCTIONS ° °Always review your discharge instruction sheet given to you by the facility where your surgery was performed. ° °IF YOU HAVE DISABILITY OR FAMILY LEAVE FORMS, YOU MUST BRING THEM TO THE OFFICE FOR PROCESSING.  DO NOT GIVE THEM TO YOUR DOCTOR. ° °1. A prescription for pain medication may be given to you upon discharge.  Take your pain medication as prescribed, if needed.  If narcotic pain medicine is not needed, then you may take acetaminophen (Tylenol) or ibuprofen (Advil) as needed. °2. Take your usually prescribed medications unless otherwise directed °3. If you need a refill on your pain medication, please contact your pharmacy.  They will contact our office to request authorization.  Prescriptions will not be filled after 5pm or on week-ends. °4. You should eat very light the first 24 hours after surgery, such as soup, crackers, pudding, etc.  Resume your normal diet the day after surgery. °5. Most patients will experience some swelling and bruising in the breast.  Ice packs and a good support bra will help.  Swelling and bruising can take several days to resolve.  °6. It is common to experience some constipation if taking pain medication after surgery.  Increasing fluid intake and taking a stool softener will usually help or prevent this problem from occurring.  A mild laxative (Milk of Magnesia or Miralax) should be taken according to package directions if there are no bowel movements after 48 hours. °7. Unless discharge instructions indicate otherwise, you may remove your bandages 24-48 hours after surgery, and you may shower at that time.  You may have steri-strips (small skin tapes) in place directly over the incision.  These strips should be left on the skin for 7-10 days.  If your surgeon used skin glue on the incision, you may shower in 24 hours.  The glue will flake off over the  next 2-3 weeks.  Any sutures or staples will be removed at the office during your follow-up visit. °8. ACTIVITIES:  You may resume regular daily activities (gradually increasing) beginning the next day.  Wearing a good support bra or sports bra minimizes pain and swelling.  You may have sexual intercourse when it is comfortable. °a. You may drive when you no longer are taking prescription pain medication, you can comfortably wear a seatbelt, and you can safely maneuver your car and apply brakes. °b. RETURN TO WORK:  ______________________________________________________________________________________ °9. You should see your doctor in the office for a follow-up appointment approximately two weeks after your surgery.  Your doctor’s nurse will typically make your follow-up appointment when she calls you with your pathology report.  Expect your pathology report 2-3 business days after your surgery.  You may call to check if you do not hear from us after three days. °10. OTHER INSTRUCTIONS: _______________________________________________________________________________________________ _____________________________________________________________________________________________________________________________________ °_____________________________________________________________________________________________________________________________________ °_____________________________________________________________________________________________________________________________________ ° °WHEN TO CALL YOUR DOCTOR: °1. Fever over 101.0 °2. Nausea and/or vomiting. °3. Extreme swelling or bruising. °4. Continued bleeding from incision. °5. Increased pain, redness, or drainage from the incision. ° °The clinic staff is available to answer your questions during regular business hours.  Please don’t hesitate to call and ask to speak to one of the nurses for clinical concerns.  If you have a medical emergency, go to the nearest  emergency room or call 911.  A surgeon from [facility] is always on call at the hospital. ° °For further questions, please visit [email or web address]  °

## 2018-06-06 NOTE — Op Note (Signed)
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 Dalbert Batman, M.D., FACS  Assistant:                      OR staff  Operative Indications:        . This is a 73 year old female, she is brought to the operating room for definitive surgery for her left breast cancerse is referred by Dr. Ike Bene at the [facility] for new diagnosis cancer left breast upper outer quadrant. PCP listed is [facility].      She gets annual mammograms but has never had a breast problem. Recent imaging studies show a 1.9 cm mass in the left breast upper outer quadrant at 2:00,   8 cm from the nipple. A couple of lymph nodes looked a little bit thickened. Image guided biopsy of the left breast mass shows invasive ductal carcinoma and DCIS, grade 1-2. ER 90%. PR 0. HER-2 negative. Ki-67 10% biopsy of one of the lymph nodes as normal lymphoid tissue and is apparently concordant     Comorbidities include BMI 34. Legally blind. Lives alone and ambulates with a cane. Has had cervical disc surgery. TAH and BSO. Hypertension. Denies diabetes heart disease or stroke Family history reveals maternal cousin had breast cancer.   . We talked about lumpectomy sentinel node biopsy radiation therapy mastectomy with or without reconstruction. The does not appear to be a survival advantage to mastectomy in her situation and I told her that. She would prefer to keep her breast and favors breast conservation. A total she is probably a good candidate for that.  plan is left breast lumpectomy with radioactive  seed localization and left axillary sentinel lymph node biopsy I discussed the techniques and risk of that in some detail   Operative Findings:       The tumor was fairly deep in the left breast but it was very lateral in the upper outer quadrant.  This allowed a curvilinear incision in the very lateral aspect of the breast which is basically a hidden scar.  The specimen mammogram looked very good with the radioactive seed and clip and mass in the center of the specimen.  I found 4 or 5 sentinel lymph nodes but none of them were pathologically enlarged.  Procedure in Detail:          Pectoral block and technetium injection were performed in the holding area.  The patient  was brought to the operating room and underwent general anesthesia with LMA device.  Timeout was performed.  Intravenous antibiotics were given.  Following alcohol prep I injected 5 cc of dilute methylene blue into the left breast subareolar area and massaged the breast for a few minutes.     The entire left chest wall and axilla were then prepped and draped in a sterile fashion.  0.25% Marcaine with epinephrine was used as local infiltration anesthetic.  Using the neoprobe I isolated the radioactive signal high in the upper outer quadrant laterally.  A curvilinear incision was made in a skin line.  The lumpectomy was performed using electrocautery and the neoprobe.  The specimen was removed and marked with silk sutures and a 6 color ink kit.  The specimen mammogram looked very good as described above.  The specimen was sent to the lab where the seed was retrieved.  The wound was irrigated.  Hemostasis was excellent.  I placed 5 metal marker clips in the walls of the lumpectomy cavity.  The breast tissues were reapproximated in layers with interrupted 3-0 Vicryl and the skin closed with a running subcuticular 4-0 Monocryl and Dermabond.      I then made a transverse incision in the left axilla at the hairline.  Dissection was carried down  through the clavipectoral fascia, entering the axilla.  I then slowly dissected out 4 or 5 sentinel lymph nodes and sent these for permanent histology to the lab.  Hemostasis was excellent and achieved with electrocautery and a few metal clips.  There was no bleeding.  I closed the clavipectoral fascia with 3-0 Vicryl sutures and I closed the skin with running subcuticular 4-0 Monocryl and Dermabond.  Dry bandages and a breast binder were placed.  The patient tolerated the procedure well and was taken to PACU in stable condition.  EBL 25 cc.  Counts correct.  Complications none.    Addendum: I logged onto the PMP aware website and reviewed her prescription medication history     Bibiana Gillean M. Dalbert Batman, M.D., FACS General and Minimally Invasive Surgery Breast and Colorectal Surgery  06/06/2018 11:53 AM

## 2018-06-07 ENCOUNTER — Encounter (HOSPITAL_COMMUNITY): Payer: Self-pay | Admitting: General Surgery

## 2018-06-11 ENCOUNTER — Encounter: Payer: Self-pay | Admitting: *Deleted

## 2018-06-11 NOTE — Progress Notes (Signed)
[place] [employer]  [employer] representative received referral from radiation oncology for transportation needs.  CSW contacted patient at home to offer support and assess for needs.  Patient has not yet decided where she will received her radiation treatment, but stated her neighbor would be providing transportation to her upcoming appointment.  CSW provided brief education on [facility] transportation program and encouraged patient to call if her transportation needs change.     Johnnye Lana, MSW, LCSW, OSW-C Clinical Social Worker [facility] [phone]

## 2018-06-13 NOTE — Progress Notes (Signed)
Patient Care Team: [facility], MD as PCP - General (Internal Medicine) Iran Planas, MD as Consulting Physician (Orthopedic Surgery)  DIAGNOSIS:    ICD-10-CM   1. Malignant neoplasm of upper-outer quadrant of left breast in female, estrogen receptor positive ([place]) C50.412    Z17.0     SUMMARY OF ONCOLOGIC HISTORY:   Malignant neoplasm of upper-outer quadrant of left breast in female, estrogen receptor positive ([place])   04/03/2018 Initial Diagnosis    Screening detected left breast mass 1.9 cm UOQ, ultrasound revealed 1.4 x 0.7 x 1.2 cm mass 2 o'clock position 8 cm from the nipple, 2 axillary lymph nodes with thickened cortices measuring 6 mm, Biopsy of the left breast mass IDC with mucinous features grade 1-2 with DCIS intermediate grade, lymph node benign, ER 90%, PR 0%, Ki-67 10%, HER-2 equivocal by IHC FISH negative ratio 1.13 copy #1.7, T1c N0 stage Ia    04/30/2018 Cancer Staging    Staging form: Breast, AJCC 8th Edition - Clinical stage from 04/30/2018: Stage IA (cT1c, cN0, cM0, G2, ER+, PR-, HER2-) - Signed by Nicholas Lose, MD on 04/30/2018    06/06/2018 Surgery    Left lumpectomy: Mucinous adenocarcinoma grade 1, 1.8 cm, DCIS, intermediate grade, negative for LV I or PNI, margins negative, DCIS focal less than 1 mm anterior margin, 0/4 lymph nodes negative, ER 90%, PR 0%, HER-2 negative, Ki-67 10%, T1CN0 stage Ia    06/14/2018 Cancer Staging    Staging form: Breast, AJCC 8th Edition - Pathologic stage from 06/14/2018: Stage IA (pT1c, pN0(sn), cM0, G1, ER+, PR-, HER2-) - Signed by Nicholas Lose, MD on 06/14/2018     CHIEF COMPLIANT: Follow-up s/p lumpectomy  INTERVAL HISTORY: Melissa Rosales is a 73 y.o. with above-mentioned history of left breast cancer. She underwent a lumpectomy on 06/06/18 for which pathology confirmed the 1.8cm tumor to be grade 1 mucinous adenocarcinoma with DCIS, ER 90%, PR negative, HER2 negative, Ki67 10% with no lymph node involvement and margins  were not involved. She presents to the clinic today with a friend to discuss the pathology report. She reports the surgery went well and has mild pain that is improving. She requested to have radiation done at [facility].   REVIEW OF SYSTEMS:   Constitutional: Denies fevers, chills or abnormal weight loss Eyes: Denies blurriness of vision Ears, nose, mouth, throat, and face: Denies mucositis or sore throat Respiratory: Denies cough, dyspnea or wheezes Cardiovascular: Denies palpitation, chest discomfort Gastrointestinal:  Denies nausea, heartburn or change in bowel habits Skin: Denies abnormal skin rashes Lymphatics: Denies new lymphadenopathy or easy bruising Neurological: Denies numbness, tingling or new weaknesses Behavioral/Psych: Mood is stable, no new changes  Extremities: No lower extremity edema Breast: denies lumps or nodules in either breasts (+) pain at surgical site All other systems were reviewed with the patient and are negative.  I have reviewed the past medical history, past surgical history, social history and family history with the patient and they are unchanged from previous note.  ALLERGIES:  is allergic to keflex [cephalexin].  MEDICATIONS:  Current Outpatient Medications  Medication Sig Dispense Refill  . amLODipine (NORVASC) 10 MG tablet Take 10 mg by mouth daily.     Marland Kitchen aspirin EC 81 MG tablet Take 81 mg by mouth daily.    . Calcium Carb-Cholecalciferol (CALCIUM + D3 PO) Take 1 tablet by mouth daily.     . clotrimazole-betamethasone (LOTRISONE) cream Apply 1 application topically 2 (two) times daily as needed (for irritation).     Marland Kitchen  Cyanocobalamin (VITAMIN B-12 PO) Take 1 tablet by mouth daily.    . DULoxetine (CYMBALTA) 30 MG capsule Take 1 tablet in the morning and 2 tablets at bedtime. (Patient taking differently: Take 30-60 mg by mouth See admin instructions. Take 30 mg by mouth in the morning and take 60 mg by mouth at bedtime) 90 capsule 5  . enalapril (VASOTEC)  20 MG tablet Take 20 mg by mouth daily.     Marland Kitchen FIBER FORMULA CAPS Take 1 capsule by mouth daily.    Marland Kitchen gabapentin (NEURONTIN) 300 MG capsule 2 CAPSULES IN THE MORNING 1 IN THE AFTERNOON AND 2 CAPSULES IN THE EVENING. (Patient taking differently: Take 300-600 mg by mouth See admin instructions. Take 600 mg by mouth in the morning, take 300 mg by mouth in the afternoon and take 600 mg by mouth at bedtime) 150 capsule 5  . hydrochlorothiazide (HYDRODIURIL) 25 MG tablet Take 25 mg by mouth daily.    Marland Kitchen HYDROcodone-acetaminophen (NORCO) 5-325 MG tablet Take 1-2 tablets by mouth every 6 (six) hours as needed for moderate pain or severe pain. 20 tablet 0  . lactulose (CHRONULAC) 10 GM/15ML solution Take 10-20 g by mouth at bedtime as needed for moderate constipation.     . Multiple Vitamins-Minerals (MULTIVITAMIN WITH MINERALS) tablet Take 1 tablet by mouth daily.    . Omega 3 1000 MG CAPS Take 1,000 mg by mouth daily.     . potassium chloride (K-DUR) 10 MEQ tablet Take 10 mEq by mouth daily.    . ranitidine (ZANTAC) 150 MG tablet Take 150 mg by mouth daily.     . vitamin C (ASCORBIC ACID) 500 MG tablet Take 500 mg by mouth daily.     No current facility-administered medications for this visit.     PHYSICAL EXAMINATION: ECOG PERFORMANCE STATUS: 1 - Symptomatic but completely ambulatory  Vitals:   06/14/18 1143  BP: (!) 145/89  Pulse: 73  Resp: 16  Temp: 98.3 F (36.8 C)  SpO2: 98%   Filed Weights   06/14/18 1143  Weight: 201 lb (91.2 kg)    GENERAL: alert, no distress and comfortable SKIN: skin color, texture, turgor are normal, no rashes or significant lesions EYES: normal, Conjunctiva are pink and non-injected, sclera clear OROPHARYNX: no exudate, no erythema and lips, buccal mucosa, and tongue normal  NECK: supple, thyroid normal size, non-tender, without nodularity LYMPH: no palpable lymphadenopathy in the cervical, axillary or inguinal LUNGS: clear to auscultation and percussion with  normal breathing effort HEART: regular rate & rhythm and no murmurs and no lower extremity edema ABDOMEN: abdomen soft, non-tender and normal bowel sounds MUSCULOSKELETAL: no cyanosis of digits and no clubbing  NEURO: alert & oriented x 3 with fluent speech, no focal motor/sensory deficits EXTREMITIES: No lower extremity edema  LABORATORY DATA:  I have reviewed the data as listed CMP Latest Ref Rng & Units 05/31/2018 11/07/2016 10/28/2016  Glucose 70 - 99 mg/dL 89 91 87  BUN 8 - 23 mg/dL '14 18 18  ' Creatinine 0.44 - 1.00 mg/dL 1.03(H) 0.99 1.35(H)  Sodium 135 - 145 mmol/L 136 141 137  Potassium 3.5 - 5.1 mmol/L 3.7 3.3(L) 3.8  Chloride 98 - 111 mmol/L 101 105 100(L)  CO2 22 - 32 mmol/L '27 26 30  ' Calcium 8.9 - 10.3 mg/dL 9.7 9.5 9.9  Total Protein 6.5 - 8.1 g/dL 8.0 - -  Total Bilirubin 0.3 - 1.2 mg/dL 0.9 - -  Alkaline Phos 38 - 126 U/L 46 - -  AST 15 - 41 U/L 23 - -  ALT 0 - 44 U/L 19 - -    Lab Results  Component Value Date   WBC 7.7 05/31/2018   HGB 13.1 05/31/2018   HCT 42.9 05/31/2018   MCV 88.3 05/31/2018   PLT 205 05/31/2018   NEUTROABS 4.4 05/31/2018    ASSESSMENT & PLAN:  Malignant neoplasm of upper-outer quadrant of left breast in female, estrogen receptor positive ([place]) 06/06/2018:Left lumpectomy: Mucinous adenocarcinoma grade 1, 1.8 cm, DCIS, intermediate grade, negative for LV I or PNI, margins negative, DCIS focal less than 1 mm anterior margin, 0/4 lymph nodes negative, ER 90%, PR 0%, HER-2 negative, Ki-67 10%, T1CN0 stage Ia  Pathology counseling: I discussed the final pathology report of the patient provided  a copy of this report. I discussed the margins as well as lymph node surgeries. We also discussed the final staging along with previously performed ER/PR and HER-2/neu testing.  Recommendation: 1.  Adjuvant radiation therapy at [facility] followed by 2.  Adjuvant antiestrogen therapy.  Because of her general health and frailty and performance status I do not  recommend chemotherapy and hence Oncotype DX is not going to be necessary.  In addition mucinous adenocarcinomas tend to have favorable prognosis and generally have low risk of recurrence.  Return to clinic at the end of radiation to start antiestrogen therapy.  No orders of the defined types were placed in this encounter.  The patient has a good understanding of the overall plan. she agrees with it. she will call with any problems that may develop before the next visit here.  Nicholas Lose, MD 06/14/2018  Julious Oka Dorshimer am acting as scribe for Dr. Nicholas Lose.  I have reviewed the above documentation for accuracy and completeness, and I agree with the above.

## 2018-06-14 ENCOUNTER — Inpatient Hospital Stay: Payer: Medicare Other | Attending: Hematology and Oncology | Admitting: Hematology and Oncology

## 2018-06-14 ENCOUNTER — Telehealth: Payer: Self-pay | Admitting: Hematology and Oncology

## 2018-06-14 ENCOUNTER — Telehealth: Payer: Self-pay | Admitting: *Deleted

## 2018-06-14 DIAGNOSIS — Z170 Estrogen receptor positive status [ER+]: Secondary | ICD-10-CM | POA: Diagnosis not present

## 2018-06-14 DIAGNOSIS — C50412 Malignant neoplasm of upper-outer quadrant of left female breast: Secondary | ICD-10-CM | POA: Insufficient documentation

## 2018-06-14 NOTE — Telephone Encounter (Signed)
Received order per Dr. Lindi Adie to refer pt to [facility] for xrt. Referral faxed with MR.

## 2018-06-14 NOTE — Telephone Encounter (Signed)
No los °

## 2018-06-14 NOTE — Assessment & Plan Note (Signed)
06/06/2018:Left lumpectomy: Mucinous adenocarcinoma grade 1, 1.8 cm, DCIS, intermediate grade, negative for LV I or PNI, margins negative, DCIS focal less than 1 mm anterior margin, 0/4 lymph nodes negative, ER 90%, PR 0%, HER-2 negative, Ki-67 10%, T1CN0 stage Ia  Pathology counseling: I discussed the final pathology report of the patient provided  a copy of this report. I discussed the margins as well as lymph node surgeries. We also discussed the final staging along with previously performed ER/PR and HER-2/neu testing.  Recommendation: 1.  Adjuvant radiation therapy followed by 2.  Adjuvant antiestrogen therapy.  Because of her general health and frailty and performance status I do not recommend chemotherapy and hence Oncotype DX is not going to be necessary.  In addition mucinous adenocarcinomas tend to have favorable prognosis and generally have low risk of recurrence.  Return to clinic at the end of radiation to start antiestrogen therapy.

## 2018-07-03 DIAGNOSIS — C50912 Malignant neoplasm of unspecified site of left female breast: Secondary | ICD-10-CM | POA: Diagnosis not present

## 2018-07-03 DIAGNOSIS — Z170 Estrogen receptor positive status [ER+]: Secondary | ICD-10-CM | POA: Diagnosis not present

## 2018-07-03 DIAGNOSIS — C50412 Malignant neoplasm of upper-outer quadrant of left female breast: Secondary | ICD-10-CM | POA: Diagnosis not present

## 2018-07-09 DIAGNOSIS — B351 Tinea unguium: Secondary | ICD-10-CM | POA: Diagnosis not present

## 2018-07-09 DIAGNOSIS — L84 Corns and callosities: Secondary | ICD-10-CM | POA: Diagnosis not present

## 2018-07-09 DIAGNOSIS — L02612 Cutaneous abscess of left foot: Secondary | ICD-10-CM | POA: Diagnosis not present

## 2018-07-11 DIAGNOSIS — C50912 Malignant neoplasm of unspecified site of left female breast: Secondary | ICD-10-CM | POA: Diagnosis not present

## 2018-07-11 DIAGNOSIS — C50412 Malignant neoplasm of upper-outer quadrant of left female breast: Secondary | ICD-10-CM | POA: Diagnosis not present

## 2018-07-12 DIAGNOSIS — C50412 Malignant neoplasm of upper-outer quadrant of left female breast: Secondary | ICD-10-CM | POA: Diagnosis not present

## 2018-07-12 DIAGNOSIS — C50912 Malignant neoplasm of unspecified site of left female breast: Secondary | ICD-10-CM | POA: Diagnosis not present

## 2018-07-13 ENCOUNTER — Telehealth: Payer: Self-pay | Admitting: Hematology and Oncology

## 2018-07-13 NOTE — Telephone Encounter (Signed)
Scheduled appt per 2/21 sch message - unable to reach patient - left message and sent reminder letter in the mail .

## 2018-07-17 ENCOUNTER — Telehealth: Payer: Self-pay | Admitting: Neurology

## 2018-07-17 MED ORDER — DULOXETINE HCL 30 MG PO CPEP: ORAL_CAPSULE | ORAL | 5 refills | Status: DC

## 2018-07-17 NOTE — Telephone Encounter (Signed)
Pt is needing a refill on her  DULoxetine (CYMBALTA) 30 MG capsule that she takes in am DULoxetine (CYMBALTA) 60 MG capsule which she takes in the pm sent to [facility] in [place].

## 2018-07-17 NOTE — Telephone Encounter (Signed)
Chart reviewed and medication refill appropriate Rx submitted via fax.

## 2018-07-19 DIAGNOSIS — M79604 Pain in right leg: Secondary | ICD-10-CM | POA: Diagnosis not present

## 2018-07-20 DIAGNOSIS — C50412 Malignant neoplasm of upper-outer quadrant of left female breast: Secondary | ICD-10-CM | POA: Diagnosis not present

## 2018-07-20 DIAGNOSIS — C50912 Malignant neoplasm of unspecified site of left female breast: Secondary | ICD-10-CM | POA: Diagnosis not present

## 2018-07-23 DIAGNOSIS — C50412 Malignant neoplasm of upper-outer quadrant of left female breast: Secondary | ICD-10-CM | POA: Diagnosis not present

## 2018-07-23 DIAGNOSIS — C50912 Malignant neoplasm of unspecified site of left female breast: Secondary | ICD-10-CM | POA: Diagnosis not present

## 2018-07-24 ENCOUNTER — Other Ambulatory Visit (HOSPITAL_COMMUNITY): Payer: Self-pay | Admitting: Family Medicine

## 2018-07-24 ENCOUNTER — Ambulatory Visit (HOSPITAL_COMMUNITY)
Admission: RE | Admit: 2018-07-24 | Discharge: 2018-07-24 | Disposition: A | Discharge status: Home / Self Care | Payer: Medicare Other | Source: Ambulatory Visit | Attending: Family Medicine | Admitting: Family Medicine

## 2018-07-24 DIAGNOSIS — M79669 Pain in unspecified lower leg: Secondary | ICD-10-CM | POA: Diagnosis not present

## 2018-07-24 DIAGNOSIS — C50912 Malignant neoplasm of unspecified site of left female breast: Secondary | ICD-10-CM | POA: Diagnosis not present

## 2018-07-24 DIAGNOSIS — M79661 Pain in right lower leg: Secondary | ICD-10-CM | POA: Diagnosis not present

## 2018-07-30 DIAGNOSIS — C50912 Malignant neoplasm of unspecified site of left female breast: Secondary | ICD-10-CM | POA: Diagnosis not present

## 2018-07-31 DIAGNOSIS — C50912 Malignant neoplasm of unspecified site of left female breast: Secondary | ICD-10-CM | POA: Diagnosis not present

## 2018-08-01 DIAGNOSIS — C50912 Malignant neoplasm of unspecified site of left female breast: Secondary | ICD-10-CM | POA: Diagnosis not present

## 2018-08-02 DIAGNOSIS — C50412 Malignant neoplasm of upper-outer quadrant of left female breast: Secondary | ICD-10-CM | POA: Diagnosis not present

## 2018-08-02 DIAGNOSIS — C50912 Malignant neoplasm of unspecified site of left female breast: Secondary | ICD-10-CM | POA: Diagnosis not present

## 2018-08-03 DIAGNOSIS — C50912 Malignant neoplasm of unspecified site of left female breast: Secondary | ICD-10-CM | POA: Diagnosis not present

## 2018-08-06 DIAGNOSIS — C50912 Malignant neoplasm of unspecified site of left female breast: Secondary | ICD-10-CM | POA: Diagnosis not present

## 2018-08-07 DIAGNOSIS — C50912 Malignant neoplasm of unspecified site of left female breast: Secondary | ICD-10-CM | POA: Diagnosis not present

## 2018-08-08 DIAGNOSIS — C50912 Malignant neoplasm of unspecified site of left female breast: Secondary | ICD-10-CM | POA: Diagnosis not present

## 2018-08-09 DIAGNOSIS — C50912 Malignant neoplasm of unspecified site of left female breast: Secondary | ICD-10-CM | POA: Diagnosis not present

## 2018-08-09 DIAGNOSIS — C50412 Malignant neoplasm of upper-outer quadrant of left female breast: Secondary | ICD-10-CM | POA: Diagnosis not present

## 2018-08-09 DIAGNOSIS — I10 Essential (primary) hypertension: Secondary | ICD-10-CM | POA: Diagnosis not present

## 2018-08-09 DIAGNOSIS — M179 Osteoarthritis of knee, unspecified: Secondary | ICD-10-CM | POA: Diagnosis not present

## 2018-08-09 DIAGNOSIS — G603 Idiopathic progressive neuropathy: Secondary | ICD-10-CM | POA: Diagnosis not present

## 2018-08-10 DIAGNOSIS — C50912 Malignant neoplasm of unspecified site of left female breast: Secondary | ICD-10-CM | POA: Diagnosis not present

## 2018-08-13 ENCOUNTER — Other Ambulatory Visit: Payer: Self-pay | Admitting: Neurology

## 2018-08-13 DIAGNOSIS — C50912 Malignant neoplasm of unspecified site of left female breast: Secondary | ICD-10-CM | POA: Diagnosis not present

## 2018-08-14 DIAGNOSIS — C50912 Malignant neoplasm of unspecified site of left female breast: Secondary | ICD-10-CM | POA: Diagnosis not present

## 2018-08-15 DIAGNOSIS — C50912 Malignant neoplasm of unspecified site of left female breast: Secondary | ICD-10-CM | POA: Diagnosis not present

## 2018-08-16 DIAGNOSIS — C50912 Malignant neoplasm of unspecified site of left female breast: Secondary | ICD-10-CM | POA: Diagnosis not present

## 2018-08-21 ENCOUNTER — Other Ambulatory Visit: Payer: Self-pay | Admitting: Adult Health

## 2018-08-30 NOTE — Assessment & Plan Note (Signed)
06/06/2018:Left lumpectomy: Mucinous adenocarcinoma grade 1, 1.8 cm, DCIS, intermediate grade, negative for LV I or PNI, margins negative, DCIS focal less than 1 mm anterior margin, 0/4 lymph nodes negative, ER 90%, PR 0%, HER-2 negative, Ki-67 10%, T1CN0 stage Ia  Adjuvant radiation therapy at [facility]  Current treatment: Adjuvant antiestrogen therapy with anastrozole 1 mg daily x5 to 7 years Anastrozole counseling:We discussed the risks and benefits of anti-estrogen therapy with aromatase inhibitors. These include but not limited to insomnia, hot flashes, mood changes, vaginal dryness, bone density loss, and weight gain. We strongly believe that the benefits far outweigh the risks. Patient understands these risks and consented to starting treatment. Planned treatment duration is 5-7 years.  Return to clinic in 3 months for survivorship care plan visit  

## 2018-09-05 ENCOUNTER — Other Ambulatory Visit: Payer: Self-pay

## 2018-09-05 ENCOUNTER — Ambulatory Visit (INDEPENDENT_AMBULATORY_CARE_PROVIDER_SITE_OTHER): Payer: Medicare Other | Admitting: Neurology

## 2018-09-05 ENCOUNTER — Telehealth: Payer: Self-pay

## 2018-09-05 ENCOUNTER — Encounter: Payer: Self-pay | Admitting: Neurology

## 2018-09-05 DIAGNOSIS — M79601 Pain in right arm: Secondary | ICD-10-CM | POA: Diagnosis not present

## 2018-09-05 DIAGNOSIS — M79602 Pain in left arm: Secondary | ICD-10-CM

## 2018-09-05 DIAGNOSIS — R202 Paresthesia of skin: Secondary | ICD-10-CM | POA: Diagnosis not present

## 2018-09-05 DIAGNOSIS — G959 Disease of spinal cord, unspecified: Secondary | ICD-10-CM

## 2018-09-05 MED ORDER — DULOXETINE HCL 60 MG PO CPEP: 60.0000 mg | ORAL_CAPSULE | Freq: Two times a day (BID) | ORAL | 4 refills | Status: DC

## 2018-09-05 NOTE — Progress Notes (Signed)
Virtual Visit via Telephone Note  I connected with Melissa Rosales on 09/05/18 at  1:15 PM EDT by telephone and verified that I am speaking with the correct person using two identifiers.   I discussed the limitations, risks, security and privacy concerns of performing an evaluation and management service by telephone and the availability of in person appointments. I also discussed with the patient that there may be a patient responsible charge related to this service. The patient expressed understanding and agreed to proceed.   History of Present Illness: 09/05/2018: Melissa Rosales is a 73 year old female with history of numbness and paresthesia in her upper extremities. In June 2018 she had an MRI of her cervical spine that showed cervical myelopathy at C4-C5 level, requiring decompressive surgery.  She has continued to have numbness, paresthesia of both hands, right greater than the left.  She has complained of some burning, tingling in her right leg.  She will has been started on gabapentin and Cymbalta and both have been increased.  She is currently taking Cymbalta 30 mg in the morning, 60 mg at bedtime.  She reports that this has helped with the burning sensation in her hands and arms.  She is currently taking gabapentin 600 mg in the morning, 300 mg at lunch, 600 mg at bedtime.  She reports she is tolerating medications well.  She was diagnosed with breast cancer in December 2019, has undergone radiation.  She reports she does not drive, she lives alone.  She reports she is able to perform her own ADLs.  She denies any falls.  09/26/2017: Melissa. Rosales is a 73 year old female with a history of numbness and paresthesias in the upper extremities.  She returns today for evaluation.  At the last visit gabapentin was increased however the patient has not noticed any benefit in regards to the numbness and paresthesias in the hands.  She states that the right hand is worse than the left.  She reports cold  sensitivity and therefore wears long sleeve shirt and gloves particularly on the right side.  She did have decompressive surgery November 07, 2016 but this did not improve her symptoms.  She does not feel the symptoms have gotten worse but does not feel that she is gotten much relief.  She returns today for evaluation.   Observations/Objective: Alert, answers questions, poor historian  Assessment and Plan: 1.  Cervical myelopathy 2.  Paresthesias in upper extremities  She will continue taking gabapentin 600 mg in the morning, 300 mg at lunch, 600 mg at bedtime.  She can increase her Cymbalta to taking 60 mg in the morning, 60 mg in the evening.  She reports that this medication has been beneficial.  Unfortunately after her decompressive surgery her symptoms have not improved much.  Her MRI from 2018 shows there was spinal cord involvement, I am not sure how much she can expect complete resolution of her symptoms, we discussed this. Unless she develops new or worsening symptoms likely no need to repeat MRI, I let her know this.  She does complain of some tingling, burning down her right leg, has been present since before the surgery, has not changed.  I advised her that if her symptoms worsen or she develops any new symptoms she should let us know.  She will come back every 4 months for an office visit for a physical exam.  Follow Up Instructions: 3-4 months for in office visit    I discussed the assessment and treatment  plan with the patient. The patient was provided an opportunity to ask questions and all were answered. The patient agreed with the plan and demonstrated an understanding of the instructions.   The patient was advised to call back or seek an in-person evaluation if the symptoms worsen or if the condition fails to improve as anticipated.  I provided 30 minutes of non-face-to-face time during this encounter.   Evangeline Dakin, DNP  [facility] [address],  [place], [place] [address] [phone]

## 2018-09-05 NOTE — Progress Notes (Signed)
I have read the note, and I agree with the clinical assessment and plan.  Salisha Bardsley K Enas Winchel   

## 2018-09-05 NOTE — Telephone Encounter (Signed)
Pt is on MM's waitlist. I called pt. She reports that she is still having tingling in her hands. She confirms that she is taking cymbalta 30mg  in the AM and cymbalta 60mg  in the PM. She would like to discuss this with a provider sooner than her scheduled appt in June.  I offered pt a virtual visit but pt says that she won't be able to figure that out. She would prefer a telephone visit.  Pt understands that although there may be some limitations with this type of visit, we will take all precautions to reduce any security or privacy concerns.  Pt understands that this will be treated like an in office visit and we will file with pt's insurance, and there may be a patient responsible charge related to this service.  Pt's meds, allergies, and PMH were updated.  Please call pt at 1:15pm at phone number [phone].

## 2018-09-07 NOTE — Progress Notes (Signed)
Patient Care Team: Redmond School, MD as PCP - General (Internal Medicine) Iran Planas, MD as Consulting Physician (Orthopedic Surgery) Mauro Kaufmann, RN as Oncology Nurse Navigator Rockwell Germany, RN as Oncology Nurse Navigator  DIAGNOSIS:    ICD-10-CM   1. Malignant neoplasm of upper-outer quadrant of left breast in female, estrogen receptor positive ([place]) C50.412    Z17.0     SUMMARY OF ONCOLOGIC HISTORY:   Malignant neoplasm of upper-outer quadrant of left breast in female, estrogen receptor positive ([place])   04/03/2018 Initial Diagnosis    Screening detected left breast mass 1.9 cm UOQ, ultrasound revealed 1.4 x 0.7 x 1.2 cm mass 2 o'clock position 8 cm from the nipple, 2 axillary lymph nodes with thickened cortices measuring 6 mm, Biopsy of the left breast mass IDC with mucinous features grade 1-2 with DCIS intermediate grade, lymph node benign, ER 90%, PR 0%, Ki-67 10%, HER-2 equivocal by IHC FISH negative ratio 1.13 copy #1.7, T1c N0 stage Ia    04/30/2018 Cancer Staging    Staging form: Breast, AJCC 8th Edition - Clinical stage from 04/30/2018: Stage IA (cT1c, cN0, cM0, G2, ER+, PR-, HER2-) - Signed by Nicholas Lose, MD on 04/30/2018    06/06/2018 Surgery    Left lumpectomy: Mucinous adenocarcinoma grade 1, 1.8 cm, DCIS, intermediate grade, negative for LV I or PNI, margins negative, DCIS focal less than 1 mm anterior margin, 0/4 lymph nodes negative, ER 90%, PR 0%, HER-2 negative, Ki-67 10%, T1CN0 stage Ia    06/14/2018 Cancer Staging    Staging form: Breast, AJCC 8th Edition - Pathologic stage from 06/14/2018: Stage IA (pT1c, pN0(sn), cM0, G1, ER+, PR-, HER2-) - Signed by Nicholas Lose, MD on 06/14/2018    06/2018 - 08/2018 Radiation Therapy    Adjuvant radiation at [facility] COMPLIANT: Follow-up after radiation to discuss oral antiestrogen therapy  INTERVAL HISTORY: Melissa Rosales is a 73 y.o. with above-mentioned history of left breast cancer treated with  lumpectomy and radiation who presents to the clinic alone today to discuss further treatment with oral antiestrogen therapy.  She has radiation dermatitis but has recovered very well from radiation.  She is ready to begin her antiestrogen therapy. She is able to walk with the help of a cane.  She has extremely poor vision and is at risk of falls.  REVIEW OF SYSTEMS:   Constitutional: Denies fevers, chills or abnormal weight loss Eyes: Denies blurriness of vision Ears, nose, mouth, throat, and face: Poor eyesight Respiratory: Denies cough, dyspnea or wheezes Cardiovascular: Denies palpitation, chest discomfort Gastrointestinal: Denies nausea, heartburn or change in bowel habits Skin: Denies abnormal skin rashes Lymphatics: Denies new lymphadenopathy or easy bruising Neurological: Denies numbness, tingling or new weaknesses Behavioral/Psych: Mood is stable, no new changes  Extremities: No lower extremity edema Breast: denies any pain or lumps or nodules in either breasts All other systems were reviewed with the patient and are negative.  I have reviewed the past medical history, past surgical history, social history and family history with the patient and they are unchanged from previous note.  ALLERGIES:  is allergic to keflex [cephalexin].  MEDICATIONS:  Current Outpatient Medications  Medication Sig Dispense Refill  . amLODipine (NORVASC) 10 MG tablet Take 10 mg by mouth daily.     Marland Kitchen anastrozole (ARIMIDEX) 1 MG tablet Take 1 tablet (1 mg total) by mouth daily. 90 tablet 3  . aspirin EC 81 MG tablet Take 81 mg by mouth daily.    Marland Kitchen  Calcium Carb-Cholecalciferol (CALCIUM + D3 PO) Take 1 tablet by mouth daily.     . clotrimazole-betamethasone (LOTRISONE) cream Apply 1 application topically 2 (two) times daily as needed (for irritation).     . Cyanocobalamin (VITAMIN B-12 PO) Take 1 tablet by mouth daily.    . DULoxetine (CYMBALTA) 60 MG capsule Take 1 capsule (60 mg total) by mouth 2  (two) times daily. 60 capsule 4  . enalapril (VASOTEC) 20 MG tablet Take 20 mg by mouth daily.     Marland Kitchen FIBER FORMULA CAPS Take 1 capsule by mouth daily.    Marland Kitchen gabapentin (NEURONTIN) 300 MG capsule TAKE 2 CAPSULES IN THE MORNING AND 1 CAPSULE IN THE AFTERNOON AND 2 CAPSULES IN THE EVENING. 150 capsule 3  . hydrochlorothiazide (HYDRODIURIL) 25 MG tablet Take 25 mg by mouth daily.    Marland Kitchen HYDROcodone-acetaminophen (NORCO) 5-325 MG tablet Take 1-2 tablets by mouth every 6 (six) hours as needed for moderate pain or severe pain. 20 tablet 0  . lactulose (CHRONULAC) 10 GM/15ML solution Take 10-20 g by mouth at bedtime as needed for moderate constipation.     . Multiple Vitamins-Minerals (MULTIVITAMIN WITH MINERALS) tablet Take 1 tablet by mouth daily.    . Omega 3 1000 MG CAPS Take 1,000 mg by mouth daily.     . potassium chloride (K-DUR) 10 MEQ tablet Take 10 mEq by mouth daily.    . ranitidine (ZANTAC) 150 MG tablet Take 150 mg by mouth daily.     . vitamin C (ASCORBIC ACID) 500 MG tablet Take 500 mg by mouth daily.     No current facility-administered medications for this visit.     PHYSICAL EXAMINATION: ECOG PERFORMANCE STATUS: 1 - Symptomatic but completely ambulatory  Vitals:   09/10/18 1515  BP: 104/64  Pulse: 72  Resp: 18  Temp: 99.2 F (37.3 C)  SpO2: 97%   Filed Weights   09/10/18 1515  Weight: 204 lb 3.2 oz (92.6 kg)    GENERAL: alert, no distress and comfortable SKIN: skin color, texture, turgor are normal, no rashes or significant lesions EYES: normal, Conjunctiva are pink and non-injected, sclera clear OROPHARYNX: no exudate, no erythema and lips, buccal mucosa, and tongue normal  NECK: supple, thyroid normal size, non-tender, without nodularity LYMPH: no palpable lymphadenopathy in the cervical, axillary or inguinal LUNGS: clear to auscultation and percussion with normal breathing effort HEART: regular rate & rhythm and no murmurs and no lower extremity edema ABDOMEN:  abdomen soft, non-tender and normal bowel sounds MUSCULOSKELETAL: no cyanosis of digits and no clubbing  NEURO: alert & oriented x 3 with fluent speech, no focal motor/sensory deficits EXTREMITIES: No lower extremity edema  LABORATORY DATA:  I have reviewed the data as listed CMP Latest Ref Rng & Units 05/31/2018 11/07/2016 10/28/2016  Glucose 70 - 99 mg/dL 89 91 87  BUN 8 - 23 mg/dL _0 Creatinine 0.44 - 1.00 mg/dL 1.03(H) 0.99 1.35(H)  Sodium 135 - 145 mmol/L 136 141 137  Potassium 3.5 - 5.1 mmol/L 3.7 3.3(L) 3.8  Chloride 98 - 111 mmol/L 101 105 100(L)  CO2 22 - 32 mmol/L _1 Calcium 8.9 - 10.3 mg/dL 9.7 9.5 9.9  Total Protein 6.5 - 8.1 g/dL 8.0 - -  Total Bilirubin 0.3 - 1.2 mg/dL 0.9 - -  Alkaline Phos 38 - 126 U/L 46 - -  AST 15 - 41 U/L 23 - -  ALT 0 - 44 U/L 19 - -  Lab Results  Component Value Date   WBC 7.7 05/31/2018   HGB 13.1 05/31/2018   HCT 42.9 05/31/2018   MCV 88.3 05/31/2018   PLT 205 05/31/2018   NEUTROABS 4.4 05/31/2018    ASSESSMENT & PLAN:  Malignant neoplasm of upper-outer quadrant of left breast in female, estrogen receptor positive ([place]) 06/06/2018:Left lumpectomy: Mucinous adenocarcinoma grade 1, 1.8 cm, DCIS, intermediate grade, negative for LV I or PNI, margins negative, DCIS focal less than 1 mm anterior margin, 0/4 lymph nodes negative, ER 90%, PR 0%, HER-2 negative, Ki-67 10%, T1CN0 stage Ia  Adjuvant radiation therapy at [facility]  Current treatment: Adjuvant antiestrogen therapy with anastrozole 1 mg daily x5 to 7 years Anastrozole counseling:We discussed the risks and benefits of anti-estrogen therapy with aromatase inhibitors. These include but not limited to insomnia, hot flashes, mood changes, vaginal dryness, bone density loss, and weight gain. We strongly believe that the benefits far outweigh the risks. Patient understands these risks and consented to starting treatment. Planned treatment duration is 5-7 years.  Return to clinic in  6 months for follow-up  No orders of the defined types were placed in this encounter.  The patient has a good understanding of the overall plan. she agrees with it. she will call with any problems that may develop before the next visit here.  Nicholas Lose, MD 09/10/2018  Julious Oka Dorshimer am acting as scribe for Dr. Nicholas Lose.  I have reviewed the above documentation for accuracy and completeness, and I agree with the above.

## 2018-09-10 ENCOUNTER — Inpatient Hospital Stay: Payer: Medicare Other | Attending: Hematology and Oncology | Admitting: Hematology and Oncology

## 2018-09-10 ENCOUNTER — Other Ambulatory Visit: Payer: Self-pay

## 2018-09-10 DIAGNOSIS — Z170 Estrogen receptor positive status [ER+]: Secondary | ICD-10-CM | POA: Insufficient documentation

## 2018-09-10 DIAGNOSIS — L598 Other specified disorders of the skin and subcutaneous tissue related to radiation: Secondary | ICD-10-CM | POA: Insufficient documentation

## 2018-09-10 DIAGNOSIS — C50412 Malignant neoplasm of upper-outer quadrant of left female breast: Secondary | ICD-10-CM | POA: Diagnosis not present

## 2018-09-10 MED ORDER — ANASTROZOLE 1 MG PO TABS: 1.0000 mg | ORAL_TABLET | Freq: Every day | ORAL | 3 refills | Status: DC

## 2018-09-11 ENCOUNTER — Encounter: Payer: Self-pay | Admitting: *Deleted

## 2018-09-12 ENCOUNTER — Telehealth: Payer: Self-pay | Admitting: Neurology

## 2018-09-12 DIAGNOSIS — M2042 Other hammer toe(s) (acquired), left foot: Secondary | ICD-10-CM | POA: Diagnosis not present

## 2018-09-12 DIAGNOSIS — I739 Peripheral vascular disease, unspecified: Secondary | ICD-10-CM | POA: Diagnosis not present

## 2018-09-12 DIAGNOSIS — B351 Tinea unguium: Secondary | ICD-10-CM | POA: Diagnosis not present

## 2018-09-12 DIAGNOSIS — L84 Corns and callosities: Secondary | ICD-10-CM | POA: Diagnosis not present

## 2018-09-12 NOTE — Telephone Encounter (Signed)
I called and LMVM for pt that cymbalta (duloxetine) was increased to 60mg  po bid, gabapentin is the same.  She is to call back to confirm message.

## 2018-09-12 NOTE — Telephone Encounter (Signed)
Pt has called asking if Dr Jannifer Franklin has increased the dose of a medication that she takes in the morning.  Pt does not know the name of the medication, please call

## 2018-09-13 NOTE — Telephone Encounter (Signed)
Called pt.  She stated that she gets her meds in pillpack by [employer].  They only gave her cymbalta 30mg  caps, did not account for the increase.  I relayed from my information looks like the change was transmitted successfully.  I will call and speak to them this am and get back to her.

## 2018-09-13 NOTE — Telephone Encounter (Signed)
I called and spoke to [facility], pharmacist who makes up pillpacks for pts.   I relayed that new prescription sent to increase cymbalta to 60mg  po bid on 09-05-18.  She looked and did receive.  She did not have prior to delivery of pts meds.  She will change for them when pts friend brings back pill pack.  I called pt and relayed this to her.  She will have her friend take back the pillpack to pharmacy and make the changes to cymbalta 60mg  po bid.

## 2018-09-19 DIAGNOSIS — C50912 Malignant neoplasm of unspecified site of left female breast: Secondary | ICD-10-CM | POA: Diagnosis not present

## 2018-10-04 ENCOUNTER — Encounter: Payer: Self-pay | Admitting: *Deleted

## 2018-10-08 ENCOUNTER — Telehealth: Payer: Self-pay | Admitting: Adult Health

## 2018-10-08 NOTE — Telephone Encounter (Signed)
Scheduled SCP per sch msg. Mailed printout

## 2018-10-25 ENCOUNTER — Ambulatory Visit (INDEPENDENT_AMBULATORY_CARE_PROVIDER_SITE_OTHER): Payer: Medicare Other | Admitting: Otolaryngology

## 2018-11-12 ENCOUNTER — Telehealth: Payer: Self-pay | Admitting: Neurology

## 2018-11-12 NOTE — Telephone Encounter (Signed)
Due to current COVID 19 pandemic, our office is severely reducing in office visits until further notice, in order to minimize the risk to our patients and healthcare providers.   I called patient regarding her 6/24 appt. Patient states that she would like to reschedule. She asked if there were any available appts for next week. I advised pt that Dr. Jannifer Franklin did not have anything open, but she could see his NP. Patient states that she would rather reschedule for his next available. I rescheduled patient for 7/27. I advised patient to feel free to call our office and check for openings.

## 2018-11-12 NOTE — Telephone Encounter (Signed)
Noted  

## 2018-11-14 ENCOUNTER — Ambulatory Visit: Payer: Self-pay | Admitting: Neurology

## 2018-11-14 ENCOUNTER — Encounter

## 2018-11-15 DIAGNOSIS — Z0001 Encounter for general adult medical examination with abnormal findings: Secondary | ICD-10-CM | POA: Diagnosis not present

## 2018-11-15 DIAGNOSIS — K219 Gastro-esophageal reflux disease without esophagitis: Secondary | ICD-10-CM | POA: Diagnosis not present

## 2018-11-15 DIAGNOSIS — E782 Mixed hyperlipidemia: Secondary | ICD-10-CM | POA: Diagnosis not present

## 2018-11-15 DIAGNOSIS — Z1389 Encounter for screening for other disorder: Secondary | ICD-10-CM | POA: Diagnosis not present

## 2018-11-15 DIAGNOSIS — E7849 Other hyperlipidemia: Secondary | ICD-10-CM | POA: Diagnosis not present

## 2018-11-15 DIAGNOSIS — I10 Essential (primary) hypertension: Secondary | ICD-10-CM | POA: Diagnosis not present

## 2018-11-21 DIAGNOSIS — I10 Essential (primary) hypertension: Secondary | ICD-10-CM | POA: Diagnosis not present

## 2018-11-21 DIAGNOSIS — C50412 Malignant neoplasm of upper-outer quadrant of left female breast: Secondary | ICD-10-CM | POA: Diagnosis not present

## 2018-11-21 DIAGNOSIS — H548 Legal blindness, as defined in USA: Secondary | ICD-10-CM | POA: Diagnosis not present

## 2018-12-07 ENCOUNTER — Other Ambulatory Visit: Payer: Self-pay | Admitting: Neurology

## 2018-12-10 DIAGNOSIS — I739 Peripheral vascular disease, unspecified: Secondary | ICD-10-CM | POA: Diagnosis not present

## 2018-12-10 DIAGNOSIS — B351 Tinea unguium: Secondary | ICD-10-CM | POA: Diagnosis not present

## 2018-12-10 DIAGNOSIS — L84 Corns and callosities: Secondary | ICD-10-CM | POA: Diagnosis not present

## 2018-12-17 ENCOUNTER — Ambulatory Visit (INDEPENDENT_AMBULATORY_CARE_PROVIDER_SITE_OTHER): Payer: Medicare Other | Admitting: Neurology

## 2018-12-17 ENCOUNTER — Encounter: Payer: Self-pay | Admitting: Neurology

## 2018-12-17 ENCOUNTER — Other Ambulatory Visit: Payer: Self-pay

## 2018-12-17 ENCOUNTER — Telehealth: Payer: Self-pay | Admitting: Adult Health

## 2018-12-17 VITALS — BP 114/73 | HR 66 | Temp 96.8°F | Ht 66.0 in | Wt 208.0 lb

## 2018-12-17 DIAGNOSIS — M79602 Pain in left arm: Secondary | ICD-10-CM | POA: Diagnosis not present

## 2018-12-17 DIAGNOSIS — R202 Paresthesia of skin: Secondary | ICD-10-CM

## 2018-12-17 DIAGNOSIS — G959 Disease of spinal cord, unspecified: Secondary | ICD-10-CM

## 2018-12-17 DIAGNOSIS — M79601 Pain in right arm: Secondary | ICD-10-CM

## 2018-12-17 NOTE — Telephone Encounter (Signed)
I left a message regarding video visit with my chart support number

## 2018-12-17 NOTE — Progress Notes (Signed)
Reason for visit: Cervical myelopathy  Melissa Rosales is an 73 y.o. female  History of present illness:  Melissa Rosales is a 73 year old right-handed black female with a history of paresthesias that began in the hands in 2017.  The patient was found to have a cervical myelopathy and underwent surgery in 2018.  The patient has had residual paresthesias involving the hands that have persisted to the present time.  She is being treated with gabapentin and Cymbalta with some benefit but she continues to have tingling that does not keep her awake at night, she is able to perform all of her activities of daily living in spite of the sensory alterations in the hand.  She does not drop things.  She claims that she walks with a cane and she has not had any falls.  She denies issues controlling the bowels or the bladder.  The patient returns to this office for an evaluation.  Occasionally, she may get transient right flank pain that may come and go.  She does not have sciatica down the leg.  She is blind in the left eye.  She recently has been treated for breast cancer involving the left breast.  Past Medical History:  Diagnosis Date  . Acid reflux   . Arthritis   . Cancer ([place])    breast  . Cervical myelopathy ([place]) 05/17/2017   C4-5  . Hypertension     Past Surgical History:  Procedure Laterality Date  . ANTERIOR CERVICAL DECOMP/DISCECTOMY FUSION N/A 11/07/2016   Procedure: ANTERIOR CERVICAL DECOMPRESSION/DISCECTOMY FUSION CERVICAL 4- CERVICAL 5;  Surgeon: Jovita Gamma, MD;  Location: [facility];  Service: Neurosurgery;  Laterality: N/A;  ANTERIOR CERVICAL DECOMPRESSION/DISCECTOMY FUSION CERVICAL 4- CERVICAL 5  . BREAST LUMPECTOMY WITH RADIOACTIVE SEED AND SENTINEL LYMPH NODE BIOPSY Left 06/06/2018   Procedure: LEFT BREAST LUMPECTOMY WITH RADIOACTIVE SEED AND AXILLARY DEEP SENTINEL LYMPH NODE BIOPSY WITH BLUE DYE INJECTION;  Surgeon: Fanny Skates, MD;  Location: [place];  Service: General;   Laterality: Left;  . COLONOSCOPY    . EYE SURGERY Bilateral   . HARDWARE REMOVAL N/A 11/07/2016   Procedure: anterior cervical disectomy fusion cervical four-five (revision);  Surgeon: Jovita Gamma, MD;  Location: [facility];  Service: Neurosurgery;  Laterality: N/A;  . VAGINAL HYSTERECTOMY    . vision problems since birth      Family History  Problem Relation Age of Onset  . Arthritis Other   . Heart disease Mother   . Depression Sister     Social history:  reports that she has never smoked. She has never used smokeless tobacco. She reports that she does not drink alcohol or use drugs.    Allergies  Allergen Reactions  . Keflex [Cephalexin] Nausea And Vomiting    Medications:  Prior to Admission medications   Medication Sig Start Date End Date Taking? Authorizing Provider  amLODipine (NORVASC) 10 MG tablet Take 10 mg by mouth daily.    Yes [provider]  anastrozole (ARIMIDEX) 1 MG tablet Take 1 tablet (1 mg total) by mouth daily. 09/10/18  Yes Melissa Lose, MD  aspirin EC 81 MG tablet Take 81 mg by mouth daily.   Yes [provider]  Calcium Carb-Cholecalciferol (CALCIUM + D3 PO) Take 1 tablet by mouth daily.    Yes [provider]  clotrimazole-betamethasone (LOTRISONE) cream Apply 1 application topically 2 (two) times daily as needed (for irritation).    Yes [provider]  Cyanocobalamin (VITAMIN B-12 PO) Take  1 tablet by mouth daily.   Yes [provider]  DULoxetine (CYMBALTA) 60 MG capsule Take 1 capsule (60 mg total) by mouth 2 (two) times daily. 09/05/18  Yes Suzzanne Cloud, NP  enalapril (VASOTEC) 20 MG tablet Take 20 mg by mouth daily.    Yes [provider]  FIBER FORMULA CAPS Take 1 capsule by mouth daily.   Yes [provider]  gabapentin (NEURONTIN) 300 MG capsule TAKE 2 CAPSULES IN THE MORNING AND 1 CAPSULE IN THE AFTERNOON AND 2 CAPSULES IN THE EVENING. 12/10/18  Yes Suzzanne Cloud, NP   hydrochlorothiazide (HYDRODIURIL) 25 MG tablet Take 25 mg by mouth daily.   Yes [provider]  lactulose (CHRONULAC) 10 GM/15ML solution Take 10-20 g by mouth at bedtime as needed for moderate constipation.    Yes [provider]  Multiple Vitamins-Minerals (MULTIVITAMIN WITH MINERALS) tablet Take 1 tablet by mouth daily.   Yes [provider]  Omega 3 1000 MG CAPS Take 1,000 mg by mouth daily.    Yes [provider]  potassium chloride (K-DUR) 10 MEQ tablet Take 10 mEq by mouth daily. 05/05/17  Yes [provider]  ranitidine (ZANTAC) 150 MG tablet Take 150 mg by mouth daily.    Yes [provider]  vitamin C (ASCORBIC ACID) 500 MG tablet Take 500 mg by mouth daily.   Yes [provider]    ROS:  Out of a complete 14 system review of symptoms, the patient complains only of the following symptoms, and all other reviewed systems are negative.  Tingling in the hands Intermittent right flank pain Left eye blind  Blood pressure 114/73, pulse 66, temperature (!) 96.8 F (36 C), temperature source Temporal, height 5\' 6"  (1.676 m), weight 208 lb (94.3 kg).  Physical Exam  General: The patient is alert and cooperative at the time of the examination.  The patient is moderately obese.  Skin: 1+ edema at ankles is noted.   Neurologic Exam  Mental status: The patient is alert and oriented x 3 at the time of the examination. The patient has apparent normal recent and remote memory, with an apparently normal attention span and concentration ability.   Cranial nerves: Facial symmetry is present. Speech is normal, no aphasia or dysarthria is noted. Extraocular movements are full with the right eye. Visual fields are full with the right eye, the patient is blind with the left.  Motor: The patient has good strength in all 4 extremities.  Sensory examination: Soft touch sensation is symmetric on the face, arms, and legs.   Coordination: The patient has good finger-nose-finger and heel-to-shin bilaterally.  Gait and station: The patient has a slightly wide-based gait, the patient uses a cane for ambulation.  Tandem gait was not attempted.  Romberg is negative.  Reflexes: Deep tendon reflexes are symmetric, but are brisk throughout with exception of the ankle jerk reflexes.   Assessment/Plan:  1.  Cervical myelopathy  2.  Gait disorder  3.  Bilateral hand paresthesias   4.  Intermittent right flank pain  The hand paresthesias are likely to be a permanent issue following decompression of the spinal cord and are related to the spinal cord injury.  The patient claims that the paresthesias do not keep her from performing any tasks with her hands and she is able to rest well at night.  I would not make any further medication adjustments for this reason, she will continue the Cymbalta and gabapentin.  She  will follow-up here in 6 months.  If the issues with the right flank pain become more persistent, she is to contact our office.  [name] MD 12/17/2018 3:44 PM  [facility] [address] [place], [place] [address]  Phone [phone] Fax [phone]

## 2018-12-19 DIAGNOSIS — Z9889 Other specified postprocedural states: Secondary | ICD-10-CM | POA: Diagnosis not present

## 2018-12-19 DIAGNOSIS — Z170 Estrogen receptor positive status [ER+]: Secondary | ICD-10-CM | POA: Diagnosis not present

## 2018-12-19 DIAGNOSIS — Z923 Personal history of irradiation: Secondary | ICD-10-CM | POA: Diagnosis not present

## 2018-12-19 DIAGNOSIS — C50912 Malignant neoplasm of unspecified site of left female breast: Secondary | ICD-10-CM | POA: Diagnosis not present

## 2019-01-10 ENCOUNTER — Encounter: Payer: Self-pay | Admitting: Adult Health

## 2019-01-10 ENCOUNTER — Inpatient Hospital Stay: Payer: Medicare Other | Attending: Adult Health | Admitting: Adult Health

## 2019-01-10 DIAGNOSIS — Z170 Estrogen receptor positive status [ER+]: Secondary | ICD-10-CM

## 2019-01-10 DIAGNOSIS — C50412 Malignant neoplasm of upper-outer quadrant of left female breast: Secondary | ICD-10-CM

## 2019-01-10 NOTE — Progress Notes (Signed)
SURVIVORSHIP VIRTUAL VISIT:  I connected with Melissa Rosales on 01/10/19 at 10:00 AM EDT by telephone and verified that I am speaking with the correct person using two identifiers.   I discussed the limitations, risks, security and privacy concerns of performing an evaluation and management service by telephone and the availability of in person appointments. I also discussed with the patient that there may be a patient responsible charge related to this service. The patient expressed understanding and agreed to proceed.   BRIEF ONCOLOGIC HISTORY:  Oncology History  Malignant neoplasm of upper-outer quadrant of left breast in female, estrogen receptor positive ([place])  04/03/2018 Initial Diagnosis   Screening detected left breast mass 1.9 cm UOQ, ultrasound revealed 1.4 x 0.7 x 1.2 cm mass 2 o'clock position 8 cm from the nipple, 2 axillary lymph nodes with thickened cortices measuring 6 mm, Biopsy of the left breast mass IDC with mucinous features grade 1-2 with DCIS intermediate grade, lymph node benign, ER 90%, PR 0%, Ki-67 10%, HER-2 equivocal by IHC FISH negative ratio 1.13 copy #1.7, T1c N0 stage Ia   04/30/2018 Cancer Staging   Staging form: Breast, AJCC 8th Edition - Clinical stage from 04/30/2018: Stage IA (cT1c, cN0, cM0, G2, ER+, PR-, HER2-) - Signed by Nicholas Lose, MD on 04/30/2018   06/06/2018 Surgery   Left lumpectomy: Mucinous adenocarcinoma grade 1, 1.8 cm, DCIS, intermediate grade, negative for LV I or PNI, margins negative, DCIS focal less than 1 mm anterior margin, 0/4 lymph nodes negative, ER 90%, PR 0%, HER-2 negative, Ki-67 10%, T1CN0 stage Ia   06/14/2018 Cancer Staging   Staging form: Breast, AJCC 8th Edition - Pathologic stage from 06/14/2018: Stage IA (pT1c, pN0(sn), cM0, G1, ER+, PR-, HER2-) - Signed by Nicholas Lose, MD on 06/14/2018   07/23/2018 - 08/16/2018 Radiation Therapy   Adjuvant radiation at [facility] with Dr. Francesca Jewett: 42.56 Gy in 16 treatments   09/2018 -   Anti-estrogen oral therapy   Anastrozole daily     INTERVAL HISTORY:  Melissa Rosales to review her survivorship care plan detailing her treatment course for breast cancer, as well as monitoring long-term side effects of that treatment, education regarding health maintenance, screening, and overall wellness and health promotion.     Overall, Melissa Rosales reports feeling quite well.  She is taking Anastrozole daily and is tolerating it well.  She denies any hot flashes, arthralgias, or vaginal dryness.   She notes that she has some itching on her upper back.  She wants to know if there is any device I can prescribe for her to scratch her back easier.     REVIEW OF SYSTEMS:  Review of Systems  Constitutional: Negative for appetite change, chills, fatigue, fever and unexpected weight change.  HENT:   Negative for hearing loss, lump/mass, nosebleeds and trouble swallowing.   Eyes: Negative for eye problems and icterus.  Respiratory: Negative for chest tightness, cough and shortness of breath.   Cardiovascular: Negative for chest pain, leg swelling and palpitations.  Gastrointestinal: Negative for abdominal distention, abdominal pain, constipation, diarrhea, nausea and vomiting.  Endocrine: Negative for hot flashes.  Genitourinary: Negative for difficulty urinating.   Musculoskeletal: Negative for arthralgias and back pain.  Skin: Negative for itching and rash.  Neurological: Negative for dizziness, extremity weakness, headaches and numbness.  Hematological: Negative for adenopathy.  Psychiatric/Behavioral: Negative for depression. The patient is not nervous/anxious.   Breast: Denies any new nodularity, masses, tenderness, nipple changes, or nipple discharge.    ONCOLOGY TREATMENT TEAM:  1. Surgeon:  Dr. Dalbert Batman at [facility] 2. Medical Oncologist: Dr. Lindi Adie  3. Radiation Oncologist: Dr. Francesca Jewett    PAST MEDICAL/SURGICAL HISTORY:  Past Medical History:  Diagnosis Date  .  Acid reflux   . Arthritis   . Cancer ([place])    breast  . Cervical myelopathy ([place]) 05/17/2017   C4-5  . Hypertension    Past Surgical History:  Procedure Laterality Date  . ANTERIOR CERVICAL DECOMP/DISCECTOMY FUSION N/A 11/07/2016   Procedure: ANTERIOR CERVICAL DECOMPRESSION/DISCECTOMY FUSION CERVICAL 4- CERVICAL 5;  Surgeon: Jovita Gamma, MD;  Location: [place];  Service: Neurosurgery;  Laterality: N/A;  ANTERIOR CERVICAL DECOMPRESSION/DISCECTOMY FUSION CERVICAL 4- CERVICAL 5  . BREAST LUMPECTOMY WITH RADIOACTIVE SEED AND SENTINEL LYMPH NODE BIOPSY Left 06/06/2018   Procedure: LEFT BREAST LUMPECTOMY WITH RADIOACTIVE SEED AND AXILLARY DEEP SENTINEL LYMPH NODE BIOPSY WITH BLUE DYE INJECTION;  Surgeon: Fanny Skates, MD;  Location: [place];  Service: General;  Laterality: Left;  . COLONOSCOPY    . EYE SURGERY Bilateral   . HARDWARE REMOVAL N/A 11/07/2016   Procedure: anterior cervical disectomy fusion cervical four-five (revision);  Surgeon: Jovita Gamma, MD;  Location: [place];  Service: Neurosurgery;  Laterality: N/A;  . VAGINAL HYSTERECTOMY    . vision problems since birth       ALLERGIES:  Allergies  Allergen Reactions  . Keflex [Cephalexin] Nausea And Vomiting     CURRENT MEDICATIONS:  Outpatient Encounter Medications as of 01/10/2019  Medication Sig  . amLODipine (NORVASC) 10 MG tablet Take 10 mg by mouth daily.   Marland Kitchen anastrozole (ARIMIDEX) 1 MG tablet Take 1 tablet (1 mg total) by mouth daily.  Marland Kitchen aspirin EC 81 MG tablet Take 81 mg by mouth daily.  . Calcium Carb-Cholecalciferol (CALCIUM + D3 PO) Take 1 tablet by mouth daily.   . clotrimazole-betamethasone (LOTRISONE) cream Apply 1 application topically 2 (two) times daily as needed (for irritation).   . Cyanocobalamin (VITAMIN B-12 PO) Take 1 tablet by mouth daily.  . DULoxetine (CYMBALTA) 60 MG capsule Take 1 capsule (60 mg total) by mouth 2 (two) times daily.  . enalapril (VASOTEC) 20 MG tablet Take 20 mg by mouth daily.    Marland Kitchen FIBER FORMULA CAPS Take 1 capsule by mouth daily.  Marland Kitchen gabapentin (NEURONTIN) 300 MG capsule TAKE 2 CAPSULES IN THE MORNING AND 1 CAPSULE IN THE AFTERNOON AND 2 CAPSULES IN THE EVENING.  . hydrochlorothiazide (HYDRODIURIL) 25 MG tablet Take 25 mg by mouth daily.  Marland Kitchen lactulose (CHRONULAC) 10 GM/15ML solution Take 10-20 g by mouth at bedtime as needed for moderate constipation.   . Multiple Vitamins-Minerals (MULTIVITAMIN WITH MINERALS) tablet Take 1 tablet by mouth daily.  . Omega 3 1000 MG CAPS Take 1,000 mg by mouth daily.   . potassium chloride (K-DUR) 10 MEQ tablet Take 10 mEq by mouth daily.  . ranitidine (ZANTAC) 150 MG tablet Take 150 mg by mouth daily.   . vitamin C (ASCORBIC ACID) 500 MG tablet Take 500 mg by mouth daily.   No facility-administered encounter medications on file as of 01/10/2019.      ONCOLOGIC FAMILY HISTORY:  Family History  Problem Relation Age of Onset  . Arthritis Other   . Heart disease Mother   . Depression Sister      GENETIC COUNSELING/TESTING: Not at this time  SOCIAL HISTORY:  Social History   Socioeconomic History  . Marital status: Legally Separated    Spouse name: Not on file  . Number of children: 0  .  Years of education: 25  . Highest education level: Not on file  Occupational History  . Occupation: Retired  Scientific laboratory technician  . Financial resource strain: Not on file  . Food insecurity    Worry: Not on file    Inability: Not on file  . Transportation needs    Medical: No    Non-medical: No  Tobacco Use  . Smoking status: Never Smoker  . Smokeless tobacco: Never Used  Substance and Sexual Activity  . Alcohol use: No  . Drug use: No  . Sexual activity: Never  Lifestyle  . Physical activity    Days per week: Not on file    Minutes per session: Not on file  . Stress: Not on file  Relationships  . Social Herbalist on phone: Not on file    Gets together: Not on file    Attends religious service: Not on file     Active member of club or organization: Not on file    Attends meetings of clubs or organizations: Not on file    Relationship status: Not on file  . Intimate partner violence    Fear of current or ex partner: No    Emotionally abused: No    Physically abused: No    Forced sexual activity: No  Other Topics Concern  . Not on file  Social History Narrative   Lives alone. Niece stays with her.   Caffeine use: Drinks coffee- 1 cup per day     OBSERVATIONS/OBJECTIVE:  Patient sounds well.  In no apparent distress, Breathing is non labored.  Mood and behavior are normal.  Speech is normal.   LABORATORY DATA:  None for this visit.  DIAGNOSTIC IMAGING:  None for this visit.      ASSESSMENT AND PLAN:  Ms.. Rosales is a pleasant 73 y.o. female with Stage IA left breast invasive ductal carcinoma, ER+/PR-/HER2-, diagnosed in 03/2018, treated with lumpectomy, adjuvant radiation therapy, and anti-estrogen therapy with Anastrozole beginning in 09/2018.  She presents to the Survivorship Clinic for our initial meeting and routine follow-up post-completion of treatment for breast cancer.    1. Stage IA left breast cancer:  Ms. Ngu is continuing to recover from definitive treatment for breast cancer. She will follow-up with her medical oncologist, Dr. Lindi Adie in 6 months with history and physical exam per surveillance protocol.  She will continue her anti-estrogen therapy with Anastrozole. Thus far, she is tolerating the Anastrozole well, with minimal side effects. She was instructed to make Dr. Lindi Adie or myself aware if she begins to experience any worsening side effects of the medication and I could see her back in clinic to help manage those side effects, as needed. Her mammogram is due 02/2019; orders placed today. Today, a comprehensive survivorship care plan and treatment summary was reviewed with the patient today detailing her breast cancer diagnosis, treatment course, potential late/long-term  effects of treatment, appropriate follow-up care with recommendations for the future, and patient education resources.  A copy of this summary, along with a letter will be sent to the patient's primary care provider via mail/fax/In Basket message after today's visit.    2. Itching: I recommended she go to [employer] or another store to find something to help scratch her back where it itches.  I informed her there are no DME I am aware of that I can prescribe for this.    3. Bone health:  Given Ms. Difiore's age/history of breast cancer and her current  treatment regimen including anti-estrogen therapy with Anastrozole, she is at risk for bone demineralization.  Her last DEXA scan was 10/2017, which was normal.  She will be due for repeat in 10/2019.  In the meantime, she was encouraged to increase her consumption of foods rich in calcium, as well as increase her weight-bearing activities.  She was given education on specific activities to promote bone health.  4. Cancer screening:  Due to Ms. Crookshanks's history and her age, she should receive screening for skin cancers, colon cancer, and gynecologic cancers.  The information and recommendations are listed on the patient's comprehensive care plan/treatment summary and were reviewed in detail with the patient.    5. Health maintenance and wellness promotion: Ms. Chatmon was encouraged to consume 5-7 servings of fruits and vegetables per day. We reviewed the "Nutrition Rainbow" handout, as well as the handout "Take Control of Your Health and Reduce Your Cancer Risk" from the [employer].  She was also encouraged to engage in moderate to vigorous exercise for 30 minutes per day most days of the week. We discussed the [employer] fitness program, which is designed for cancer survivors to help them become more physically fit after cancer treatments.  She was instructed to limit her alcohol consumption and continue to abstain from tobacco use.     6.  Support services/counseling: It is not uncommon for this period of the patient's cancer care trajectory to be one of many emotions and stressors.  We discussed how this can be increasingly difficult during the times of quarantine and social distancing due to the COVID-19 pandemic.   She was given information regarding our available services and encouraged to contact me with any questions or for help enrolling in any of our support group/programs.    Follow up instructions:    -Return to cancer center in 06/2018  -Mammogram due in 02/2019 -Follow up with surgery 03/2019 -She is welcome to return back to the Survivorship Clinic at any time; no additional follow-up needed at this time.  -Consider referral back to survivorship as a long-term survivor for continued surveillance  The patient was provided an opportunity to ask questions and all were answered. The patient agreed with the plan and demonstrated an understanding of the instructions.   The patient was advised to call back or seek an in-person evaluation if the symptoms worsen or if the condition fails to improve as anticipated.   I provided 22 minutes of non face-to-face telephone visit time during this encounter, and > 50% was spent counseling as documented under my assessment & plan.  Scot Dock, NP

## 2019-02-20 DIAGNOSIS — E7849 Other hyperlipidemia: Secondary | ICD-10-CM | POA: Diagnosis not present

## 2019-02-20 DIAGNOSIS — M1991 Primary osteoarthritis, unspecified site: Secondary | ICD-10-CM | POA: Diagnosis not present

## 2019-02-20 DIAGNOSIS — I10 Essential (primary) hypertension: Secondary | ICD-10-CM | POA: Diagnosis not present

## 2019-03-04 DIAGNOSIS — I739 Peripheral vascular disease, unspecified: Secondary | ICD-10-CM | POA: Diagnosis not present

## 2019-03-04 DIAGNOSIS — S90122A Contusion of left lesser toe(s) without damage to nail, initial encounter: Secondary | ICD-10-CM | POA: Diagnosis not present

## 2019-03-04 DIAGNOSIS — L84 Corns and callosities: Secondary | ICD-10-CM | POA: Diagnosis not present

## 2019-03-04 DIAGNOSIS — B351 Tinea unguium: Secondary | ICD-10-CM | POA: Diagnosis not present

## 2019-03-05 ENCOUNTER — Other Ambulatory Visit: Payer: Self-pay

## 2019-03-05 ENCOUNTER — Ambulatory Visit
Admission: RE | Admit: 2019-03-05 | Discharge: 2019-03-05 | Disposition: A | Discharge status: Home / Self Care | Payer: Medicare Other | Source: Ambulatory Visit | Attending: Adult Health | Admitting: Adult Health

## 2019-03-05 DIAGNOSIS — C50412 Malignant neoplasm of upper-outer quadrant of left female breast: Secondary | ICD-10-CM

## 2019-03-05 DIAGNOSIS — Z170 Estrogen receptor positive status [ER+]: Secondary | ICD-10-CM

## 2019-03-05 DIAGNOSIS — R928 Other abnormal and inconclusive findings on diagnostic imaging of breast: Secondary | ICD-10-CM | POA: Diagnosis not present

## 2019-03-05 HISTORY — DX: Personal history of irradiation: Z92.3

## 2019-03-06 DIAGNOSIS — M79672 Pain in left foot: Secondary | ICD-10-CM | POA: Diagnosis not present

## 2019-03-14 ENCOUNTER — Telehealth: Payer: Self-pay | Admitting: Neurology

## 2019-03-14 NOTE — Telephone Encounter (Signed)
Pt called stating she needs to speak to the RN about her PCP informing her that she is needing to increase her gabapentin (NEURONTIN) 300 MG capsule Please advise.

## 2019-03-18 MED ORDER — GABAPENTIN 600 MG PO TABS: 600.0000 mg | ORAL_TABLET | Freq: Three times a day (TID) | ORAL | 1 refills | Status: DC

## 2019-03-18 NOTE — Telephone Encounter (Signed)
LVM asking pt to call me back.

## 2019-03-18 NOTE — Telephone Encounter (Signed)
I called the patient.  The patient initially indicates that she is having pain into the left leg when she is up walking.  After discussion, she switches sides and indicates it is her right side that is causing problems.  She does not seem to be certain about what exactly is happening.  She wants to go up on the dose of the gabapentin, I will go ahead and send in a prescription for the 600 mg tablets taking 1 full tablet 3 times daily.

## 2019-03-18 NOTE — Addendum Note (Signed)
Addended by: [name] on: 03/18/2019 10:29 AM   Modules accepted: Orders

## 2019-03-18 NOTE — Telephone Encounter (Signed)
Pt returned my call. She states she recently had a visit with her PCP and expressed tingling/weakness in her right leg. Pt states her PCP advised to call our office and discuss possibly increasing her gabapentin. Pt confirms she is currently taking 300 mg 5 total per day ( 2 in the am 1 in the afternoon and 2 in the pm).   Pt would like to know if Dr. Jannifer Franklin thinks she should increase her medication?

## 2019-03-23 DIAGNOSIS — E7849 Other hyperlipidemia: Secondary | ICD-10-CM | POA: Diagnosis not present

## 2019-03-23 DIAGNOSIS — I10 Essential (primary) hypertension: Secondary | ICD-10-CM | POA: Diagnosis not present

## 2019-03-23 DIAGNOSIS — M1711 Unilateral primary osteoarthritis, right knee: Secondary | ICD-10-CM | POA: Diagnosis not present

## 2019-03-29 DIAGNOSIS — M545 Low back pain: Secondary | ICD-10-CM | POA: Diagnosis not present

## 2019-03-29 DIAGNOSIS — Z23 Encounter for immunization: Secondary | ICD-10-CM | POA: Diagnosis not present

## 2019-03-29 DIAGNOSIS — M5410 Radiculopathy, site unspecified: Secondary | ICD-10-CM | POA: Diagnosis not present

## 2019-04-01 ENCOUNTER — Other Ambulatory Visit: Payer: Self-pay | Admitting: General Surgery

## 2019-04-22 DIAGNOSIS — M1711 Unilateral primary osteoarthritis, right knee: Secondary | ICD-10-CM | POA: Diagnosis not present

## 2019-04-22 DIAGNOSIS — E7849 Other hyperlipidemia: Secondary | ICD-10-CM | POA: Diagnosis not present

## 2019-04-22 DIAGNOSIS — I10 Essential (primary) hypertension: Secondary | ICD-10-CM | POA: Diagnosis not present

## 2019-04-24 DIAGNOSIS — C50412 Malignant neoplasm of upper-outer quadrant of left female breast: Secondary | ICD-10-CM | POA: Diagnosis not present

## 2019-04-24 DIAGNOSIS — H548 Legal blindness, as defined in USA: Secondary | ICD-10-CM | POA: Diagnosis not present

## 2019-04-24 DIAGNOSIS — I10 Essential (primary) hypertension: Secondary | ICD-10-CM | POA: Diagnosis not present

## 2019-04-30 ENCOUNTER — Encounter: Payer: Self-pay | Admitting: *Deleted

## 2019-05-06 DIAGNOSIS — M2042 Other hammer toe(s) (acquired), left foot: Secondary | ICD-10-CM | POA: Diagnosis not present

## 2019-05-06 DIAGNOSIS — L84 Corns and callosities: Secondary | ICD-10-CM | POA: Diagnosis not present

## 2019-05-06 DIAGNOSIS — I739 Peripheral vascular disease, unspecified: Secondary | ICD-10-CM | POA: Diagnosis not present

## 2019-05-06 DIAGNOSIS — B351 Tinea unguium: Secondary | ICD-10-CM | POA: Diagnosis not present

## 2019-05-23 DIAGNOSIS — M1711 Unilateral primary osteoarthritis, right knee: Secondary | ICD-10-CM | POA: Diagnosis not present

## 2019-05-23 DIAGNOSIS — E7849 Other hyperlipidemia: Secondary | ICD-10-CM | POA: Diagnosis not present

## 2019-05-23 DIAGNOSIS — G603 Idiopathic progressive neuropathy: Secondary | ICD-10-CM | POA: Diagnosis not present

## 2019-05-23 DIAGNOSIS — I2510 Atherosclerotic heart disease of native coronary artery without angina pectoris: Secondary | ICD-10-CM | POA: Diagnosis not present

## 2019-05-29 DIAGNOSIS — C50912 Malignant neoplasm of unspecified site of left female breast: Secondary | ICD-10-CM | POA: Diagnosis not present

## 2019-06-03 ENCOUNTER — Other Ambulatory Visit: Payer: Self-pay | Admitting: Neurology

## 2019-06-13 ENCOUNTER — Other Ambulatory Visit: Payer: Self-pay | Admitting: Neurology

## 2019-06-21 ENCOUNTER — Telehealth: Payer: Self-pay

## 2019-06-21 NOTE — Telephone Encounter (Signed)
Pt. States she had COVID 19 testing in [place] at a community teat site. Does not remember where. No results in her chart. Given [facility]. Number to call for more information.

## 2019-06-23 DIAGNOSIS — G603 Idiopathic progressive neuropathy: Secondary | ICD-10-CM | POA: Diagnosis not present

## 2019-06-23 DIAGNOSIS — E7849 Other hyperlipidemia: Secondary | ICD-10-CM | POA: Diagnosis not present

## 2019-06-23 DIAGNOSIS — I10 Essential (primary) hypertension: Secondary | ICD-10-CM | POA: Diagnosis not present

## 2019-06-23 DIAGNOSIS — M1711 Unilateral primary osteoarthritis, right knee: Secondary | ICD-10-CM | POA: Diagnosis not present

## 2019-06-25 ENCOUNTER — Encounter: Payer: Self-pay | Admitting: Neurology

## 2019-06-25 ENCOUNTER — Other Ambulatory Visit: Payer: Self-pay

## 2019-06-25 ENCOUNTER — Ambulatory Visit (INDEPENDENT_AMBULATORY_CARE_PROVIDER_SITE_OTHER): Payer: Medicare Other | Admitting: Neurology

## 2019-06-25 VITALS — BP 122/76 | HR 68 | Temp 97.7°F | Ht 66.0 in | Wt 217.8 lb

## 2019-06-25 DIAGNOSIS — M79601 Pain in right arm: Secondary | ICD-10-CM | POA: Diagnosis not present

## 2019-06-25 DIAGNOSIS — R202 Paresthesia of skin: Secondary | ICD-10-CM

## 2019-06-25 DIAGNOSIS — G959 Disease of spinal cord, unspecified: Secondary | ICD-10-CM

## 2019-06-25 DIAGNOSIS — M79602 Pain in left arm: Secondary | ICD-10-CM

## 2019-06-25 MED ORDER — GABAPENTIN 600 MG PO TABS: 600.0000 mg | ORAL_TABLET | Freq: Four times a day (QID) | ORAL | 1 refills | Status: DC

## 2019-06-25 MED ORDER — DULOXETINE HCL 60 MG PO CPEP: 60.0000 mg | ORAL_CAPSULE | Freq: Two times a day (BID) | ORAL | 4 refills | Status: DC

## 2019-06-25 NOTE — Patient Instructions (Signed)
Continue taking Cymbalta 60 mg twice daily, we can increase gabapentin to 600 mg 4 times a day

## 2019-06-25 NOTE — Progress Notes (Signed)
I have read the note, and I agree with the clinical assessment and plan.  Brazil Shellhammer K Liberta Gimpel   

## 2019-06-25 NOTE — Progress Notes (Signed)
PATIENT: Melissa Rosales DOB: 07/18/1945  REASON FOR VISIT: follow up HISTORY FROM: patient  HISTORY OF PRESENT ILLNESS: Today 06/25/19  Melissa Rosales is a 74 year old female with history of paresthesias that began in the hands in 2017.  She was found to have a cervical myelopathy and underwent surgery in 2018.  She has had residual paresthesias involving the hands.  She is being treated with gabapentin and Cymbalta.  Her gabapentin dose was recently increased to 600 mg 3 times a day.  She has complained of pain in the right leg if she stands too long.  Gabapentin is helpful for paresthesias.  She uses a cane for ambulation.  She has not had any falls.  She reports she has tingling and burning in both hands, right leg since surgery.  The right hand is worse than the left.  She says it does not prevent her from doing anything.  She is interested in increasing the gabapentin, to help with her symptoms.  She presents today for follow-up unaccompanied.  HISTORY 12/17/2018 Dr. Jannifer Franklin: Melissa Rosales is a 74 year old right-handed black female with a history of paresthesias that began in the hands in 2017.  The patient was found to have a cervical myelopathy and underwent surgery in 2018.  The patient has had residual paresthesias involving the hands that have persisted to the present time.  She is being treated with gabapentin and Cymbalta with some benefit but she continues to have tingling that does not keep her awake at night, she is able to perform all of her activities of daily living in spite of the sensory alterations in the hand.  She does not drop things.  She claims that she walks with a cane and she has not had any falls.  She denies issues controlling the bowels or the bladder.  The patient returns to this office for an evaluation.  Occasionally, she may get transient right flank pain that may come and go.  She does not have sciatica down the leg.  She is blind in the left eye.  She recently has been  treated for breast cancer involving the left breast.  REVIEW OF SYSTEMS: Out of a complete 14 system review of symptoms, the patient complains only of the following symptoms, and all other reviewed systems are negative.  Numbness  ALLERGIES: Allergies  Allergen Reactions  . Keflex [Cephalexin] Nausea And Vomiting    HOME MEDICATIONS: Outpatient Medications Prior to Visit  Medication Sig Dispense Refill  . amLODipine (NORVASC) 10 MG tablet Take 10 mg by mouth daily.     Marland Kitchen anastrozole (ARIMIDEX) 1 MG tablet Take 1 tablet (1 mg total) by mouth daily. 90 tablet 3  . aspirin EC 81 MG tablet Take 81 mg by mouth daily.    . Calcium Carb-Cholecalciferol (CALCIUM + D3 PO) Take 1 tablet by mouth daily.     . clotrimazole-betamethasone (LOTRISONE) cream Apply 1 application topically 2 (two) times daily as needed (for irritation).     . Cyanocobalamin (VITAMIN B-12 PO) Take 1 tablet by mouth daily.    . enalapril (VASOTEC) 20 MG tablet Take 20 mg by mouth daily.     Marland Kitchen FIBER FORMULA CAPS Take 1 capsule by mouth daily.    . hydrochlorothiazide (HYDRODIURIL) 25 MG tablet Take 25 mg by mouth daily.    Marland Kitchen lactulose (CHRONULAC) 10 GM/15ML solution Take 10-20 g by mouth at bedtime as needed for moderate constipation.     . Multiple Vitamins-Minerals (MULTIVITAMIN WITH  MINERALS) tablet Take 1 tablet by mouth daily.    . Omega 3 1000 MG CAPS Take 1,000 mg by mouth daily.     . potassium chloride (K-DUR) 10 MEQ tablet Take 10 mEq by mouth daily.    . ranitidine (ZANTAC) 150 MG tablet Take 150 mg by mouth daily.     . vitamin C (ASCORBIC ACID) 500 MG tablet Take 500 mg by mouth daily.    . DULoxetine (CYMBALTA) 60 MG capsule Take 1 capsule (60 mg total) by mouth 2 (two) times daily. 60 capsule 4  . gabapentin (NEURONTIN) 600 MG tablet Take 1 tablet (600 mg total) by mouth 3 (three) times daily. 270 tablet 1   No facility-administered medications prior to visit.    PAST MEDICAL HISTORY: Past Medical  History:  Diagnosis Date  . Acid reflux   . Arthritis   . Cancer ([place])    breast  . Cervical myelopathy ([place]) 05/17/2017   C4-5  . Hypertension   . Personal history of radiation therapy     PAST SURGICAL HISTORY: Past Surgical History:  Procedure Laterality Date  . ANTERIOR CERVICAL DECOMP/DISCECTOMY FUSION N/A 11/07/2016   Procedure: ANTERIOR CERVICAL DECOMPRESSION/DISCECTOMY FUSION CERVICAL 4- CERVICAL 5;  Surgeon: Jovita Gamma, MD;  Location: [facility];  Service: Neurosurgery;  Laterality: N/A;  ANTERIOR CERVICAL DECOMPRESSION/DISCECTOMY FUSION CERVICAL 4- CERVICAL 5  . BREAST LUMPECTOMY Left 05/2018  . BREAST LUMPECTOMY WITH RADIOACTIVE SEED AND SENTINEL LYMPH NODE BIOPSY Left 06/06/2018   Procedure: LEFT BREAST LUMPECTOMY WITH RADIOACTIVE SEED AND AXILLARY DEEP SENTINEL LYMPH NODE BIOPSY WITH BLUE DYE INJECTION;  Surgeon: Fanny Skates, MD;  Location: [facility];  Service: General;  Laterality: Left;  . COLONOSCOPY    . EYE SURGERY Bilateral   . HARDWARE REMOVAL N/A 11/07/2016   Procedure: anterior cervical disectomy fusion cervical four-five (revision);  Surgeon: Jovita Gamma, MD;  Location: [facility];  Service: Neurosurgery;  Laterality: N/A;  . VAGINAL HYSTERECTOMY    . vision problems since birth      FAMILY HISTORY: Family History  Problem Relation Age of Onset  . Arthritis Other   . Heart disease Mother   . Depression Sister     SOCIAL HISTORY: Social History   Socioeconomic History  . Marital status: Legally Separated    Spouse name: Not on file  . Number of children: 0  . Years of education: 10  . Highest education level: Not on file  Occupational History  . Occupation: Retired  Tobacco Use  . Smoking status: Never Smoker  . Smokeless tobacco: Never Used  Substance and Sexual Activity  . Alcohol use: No  . Drug use: No  . Sexual activity: Never  Other Topics Concern  . Not on file  Social History Narrative   Lives alone. Niece stays with her.    Caffeine use: Drinks coffee- 1 cup per day   Social Determinants of Health   Financial Resource Strain:   . Difficulty of Paying Living Expenses: Not on file  Food Insecurity:   . Worried About Charity fundraiser in the Last Year: Not on file  . Ran Out of Food in the Last Year: Not on file  Transportation Needs:   . Lack of Transportation (Medical): Not on file  . Lack of Transportation (Non-Medical): Not on file  Physical Activity:   . Days of Exercise per Week: Not on file  . Minutes of Exercise per Session: Not on file  Stress:   . Feeling of  Stress : Not on file  Social Connections:   . Frequency of Communication with Friends and Family: Not on file  . Frequency of Social Gatherings with Friends and Family: Not on file  . Attends Religious Services: Not on file  . Active Member of Clubs or Organizations: Not on file  . Attends Archivist Meetings: Not on file  . Marital Status: Not on file  Intimate Partner Violence:   . Fear of Current or Ex-Partner: Not on file  . Emotionally Abused: Not on file  . Physically Abused: Not on file  . Sexually Abused: Not on file   PHYSICAL EXAM  Vitals:   06/25/19 1519  BP: 122/76  Pulse: 68  Temp: 97.7 F (36.5 C)  TempSrc: Oral  Weight: 217 lb 12.8 oz (98.8 kg)  Height: 5\' 6"  (1.676 m)   Body mass index is 35.15 kg/m.  Generalized: Well developed, in no acute distress   Neurological examination  Mentation: Alert oriented to time, place, history taking. Follows all commands speech and language fluent Cranial nerve II-XII: Extraocular movements were full, visual field were full on the right eye, she is blind in the left eye.  Facial sensation and strength were normal. Head turning and shoulder shrug were normal and symmetric. Motor: Good strength in all extremities Sensory: Sensory testing is intact to soft touch on all 4 extremities. No evidence of extinction is noted.  Coordination: Cerebellar testing reveals good  finger-nose-finger and heel-to-shin bilaterally.  Gait and station: Gait is wide-based, uses a cane. Tandem gait was not attempted. Reflexes: Deep tendon reflexes are symmetric but brisk  DIAGNOSTIC DATA (LABS, IMAGING, TESTING) - I reviewed patient records, labs, notes, testing and imaging myself where available.  Lab Results  Component Value Date   WBC 7.7 05/31/2018   HGB 13.1 05/31/2018   HCT 42.9 05/31/2018   MCV 88.3 05/31/2018   PLT 205 05/31/2018      Component Value Date/Time   NA 136 05/31/2018 1525   K 3.7 05/31/2018 1525   CL 101 05/31/2018 1525   CO2 27 05/31/2018 1525   GLUCOSE 89 05/31/2018 1525   BUN 14 05/31/2018 1525   CREATININE 1.03 (H) 05/31/2018 1525   CALCIUM 9.7 05/31/2018 1525   PROT 8.0 05/31/2018 1525   ALBUMIN 4.3 05/31/2018 1525   AST 23 05/31/2018 1525   ALT 19 05/31/2018 1525   ALKPHOS 46 05/31/2018 1525   BILITOT 0.9 05/31/2018 1525   GFRNONAA 55 (L) 05/31/2018 1525   GFRAA >60 05/31/2018 1525   No results found for: CHOL, HDL, LDLCALC, LDLDIRECT, TRIG, CHOLHDL No results found for: HGBA1C Lab Results  Component Value Date   VITAMINB12 >2000 (H) 07/04/2016   No results found for: TSH  ASSESSMENT AND PLAN 74 y.o. year old female  has a past medical history of Acid reflux, Arthritis, Cancer ([place]), Cervical myelopathy ([place]) (05/17/2017), Hypertension, and Personal history of radiation therapy. here with:  1.  Cervical myelopathy 2.  Gait disorder 3.  Bilateral hand paresthesia, also in right leg  She has reported chronic paresthesias following her surgery.  Gabapentin seems to be working well for her symptoms, she would like to try dose increase.  I will increase gabapentin 600 mg 4 times a day.  She will remain on Cymbalta 60 mg twice a day.  She will follow-up here in 8 months or sooner if needed.  I did advise if her symptoms worsen or she develops any new symptoms she should let us  know.   I spent 15 minutes with the patient. 50% of  this time was spent discussing her plan of care.   Butler Denmark, AGNP-C, DNP 06/25/2019, 4:33 PM [facility] [address] [place], [place] [address] [phone]

## 2019-06-26 ENCOUNTER — Encounter: Payer: Self-pay | Admitting: Gastroenterology

## 2019-06-28 ENCOUNTER — Other Ambulatory Visit: Payer: Self-pay

## 2019-06-28 ENCOUNTER — Ambulatory Visit: Payer: Medicare Other | Attending: Internal Medicine

## 2019-06-28 DIAGNOSIS — Z20822 Contact with and (suspected) exposure to covid-19: Secondary | ICD-10-CM | POA: Insufficient documentation

## 2019-06-30 LAB — NOVEL CORONAVIRUS, NAA: SARS-CoV-2, NAA: NOT DETECTED

## 2019-07-01 ENCOUNTER — Telehealth: Payer: Self-pay

## 2019-07-01 NOTE — Telephone Encounter (Signed)
Pt. Given COVID 19 results, verbalizes understanding. 

## 2019-07-02 ENCOUNTER — Telehealth: Payer: Self-pay | Admitting: Neurology

## 2019-07-02 NOTE — Telephone Encounter (Signed)
She has chronic paresthesias following surgery and cervical myelopathy.  If she thinks physical therapy may be beneficial and even OT, we may try it.  Has she ever done this before?  Agree, she should contact pharmacy to see if they are able to give her her increase gabapentin.  I sent the refill 06/25/2019.

## 2019-07-02 NOTE — Telephone Encounter (Signed)
Pt would like to try PT/OT.

## 2019-07-02 NOTE — Telephone Encounter (Signed)
Pt is asking for a call from RN to discuss that her legs hurt and are tingling

## 2019-07-02 NOTE — Telephone Encounter (Signed)
I called pt and she takes her meds thru pill pack, [facility].  I relayed to call and see if they are able to give her some of the increased gabapentin since her pill pack does not come until next week.  She also asked about taking some therapy if this would help her with hand, leg issues.

## 2019-07-03 ENCOUNTER — Other Ambulatory Visit: Payer: Self-pay | Admitting: Neurology

## 2019-07-03 NOTE — Telephone Encounter (Signed)
I spoke to pt and she is ok to do both PT/OT. Did not recall name of therapy co that she has used previously. Will use [facility].

## 2019-07-03 NOTE — Telephone Encounter (Signed)
Pt is needing a refill on her gabapentin (NEURONTIN) 600 MG tablet sent in to [facility] in [place]

## 2019-07-03 NOTE — Telephone Encounter (Signed)
Called patient and advised her gabapentin was refilled 06/25/19 to [facility]. She stated she called them today; they told her the Rx they have is for 3 x daily, not 4. I advised I'll call pharmacy.Patient verbalized understanding, appreciation. Called [facility], spoke with Erlene Quan and advised him of Rx sent 06/25/19. He stated he saw it, but it was entered incorrectly. He stated he would correct Rx, fill it and call patient.

## 2019-07-03 NOTE — Addendum Note (Signed)
Addended by: Brandon Melnick on: 07/03/2019 10:53 AM   Modules accepted: Orders

## 2019-07-09 ENCOUNTER — Other Ambulatory Visit: Payer: Self-pay

## 2019-07-09 DIAGNOSIS — L84 Corns and callosities: Secondary | ICD-10-CM | POA: Diagnosis not present

## 2019-07-09 DIAGNOSIS — I739 Peripheral vascular disease, unspecified: Secondary | ICD-10-CM | POA: Diagnosis not present

## 2019-07-09 DIAGNOSIS — M2042 Other hammer toe(s) (acquired), left foot: Secondary | ICD-10-CM | POA: Diagnosis not present

## 2019-07-09 DIAGNOSIS — B351 Tinea unguium: Secondary | ICD-10-CM | POA: Diagnosis not present

## 2019-07-09 NOTE — Patient Outreach (Signed)
[facility]) Care Management  07/09/2019  DANEA HENARD 11-Oct-1945 [id number]   Medication Adherence call to Mrs. Ellenore [employer] Voice message left with a call back number. Mrs. Vandehey is showing past due on Enalapril 20 mg under [facility].   [facility] Management Direct Dial [phone]  Fax [phone] [email or web address]

## 2019-07-21 DIAGNOSIS — E7849 Other hyperlipidemia: Secondary | ICD-10-CM | POA: Diagnosis not present

## 2019-07-21 DIAGNOSIS — I10 Essential (primary) hypertension: Secondary | ICD-10-CM | POA: Diagnosis not present

## 2019-07-21 DIAGNOSIS — G603 Idiopathic progressive neuropathy: Secondary | ICD-10-CM | POA: Diagnosis not present

## 2019-07-21 DIAGNOSIS — M1711 Unilateral primary osteoarthritis, right knee: Secondary | ICD-10-CM | POA: Diagnosis not present

## 2019-08-21 DIAGNOSIS — G603 Idiopathic progressive neuropathy: Secondary | ICD-10-CM | POA: Diagnosis not present

## 2019-08-21 DIAGNOSIS — E7849 Other hyperlipidemia: Secondary | ICD-10-CM | POA: Diagnosis not present

## 2019-08-21 DIAGNOSIS — M1711 Unilateral primary osteoarthritis, right knee: Secondary | ICD-10-CM | POA: Diagnosis not present

## 2019-08-21 DIAGNOSIS — I10 Essential (primary) hypertension: Secondary | ICD-10-CM | POA: Diagnosis not present

## 2019-09-05 ENCOUNTER — Other Ambulatory Visit: Payer: Self-pay | Admitting: Hematology and Oncology

## 2019-09-20 DIAGNOSIS — E7849 Other hyperlipidemia: Secondary | ICD-10-CM | POA: Diagnosis not present

## 2019-09-20 DIAGNOSIS — M1711 Unilateral primary osteoarthritis, right knee: Secondary | ICD-10-CM | POA: Diagnosis not present

## 2019-09-20 DIAGNOSIS — G603 Idiopathic progressive neuropathy: Secondary | ICD-10-CM | POA: Diagnosis not present

## 2019-09-20 DIAGNOSIS — I10 Essential (primary) hypertension: Secondary | ICD-10-CM | POA: Diagnosis not present

## 2019-09-24 DIAGNOSIS — B351 Tinea unguium: Secondary | ICD-10-CM | POA: Diagnosis not present

## 2019-09-24 DIAGNOSIS — M2042 Other hammer toe(s) (acquired), left foot: Secondary | ICD-10-CM | POA: Diagnosis not present

## 2019-09-24 DIAGNOSIS — I739 Peripheral vascular disease, unspecified: Secondary | ICD-10-CM | POA: Diagnosis not present

## 2019-10-07 ENCOUNTER — Telehealth: Payer: Self-pay | Admitting: Hematology and Oncology

## 2019-10-07 ENCOUNTER — Other Ambulatory Visit: Payer: Self-pay | Admitting: Hematology and Oncology

## 2019-10-07 ENCOUNTER — Other Ambulatory Visit: Payer: Self-pay | Admitting: Neurology

## 2019-10-07 NOTE — Telephone Encounter (Signed)
Scheduled appt per 5/17 sch message - unable to reach pt . Left message with appt date and time on vmail

## 2019-10-21 DIAGNOSIS — I10 Essential (primary) hypertension: Secondary | ICD-10-CM | POA: Diagnosis not present

## 2019-10-21 DIAGNOSIS — E7849 Other hyperlipidemia: Secondary | ICD-10-CM | POA: Diagnosis not present

## 2019-10-21 DIAGNOSIS — G603 Idiopathic progressive neuropathy: Secondary | ICD-10-CM | POA: Diagnosis not present

## 2019-10-21 DIAGNOSIS — M1711 Unilateral primary osteoarthritis, right knee: Secondary | ICD-10-CM | POA: Diagnosis not present

## 2019-11-04 DIAGNOSIS — L299 Pruritus, unspecified: Secondary | ICD-10-CM | POA: Diagnosis not present

## 2019-11-14 DIAGNOSIS — L500 Allergic urticaria: Secondary | ICD-10-CM | POA: Diagnosis not present

## 2019-11-14 DIAGNOSIS — Z1389 Encounter for screening for other disorder: Secondary | ICD-10-CM | POA: Diagnosis not present

## 2019-11-20 DIAGNOSIS — I10 Essential (primary) hypertension: Secondary | ICD-10-CM | POA: Diagnosis not present

## 2019-11-20 DIAGNOSIS — M1711 Unilateral primary osteoarthritis, right knee: Secondary | ICD-10-CM | POA: Diagnosis not present

## 2019-11-20 DIAGNOSIS — E7849 Other hyperlipidemia: Secondary | ICD-10-CM | POA: Diagnosis not present

## 2019-11-20 DIAGNOSIS — G603 Idiopathic progressive neuropathy: Secondary | ICD-10-CM | POA: Diagnosis not present

## 2019-12-02 DIAGNOSIS — C50912 Malignant neoplasm of unspecified site of left female breast: Secondary | ICD-10-CM | POA: Diagnosis not present

## 2019-12-04 ENCOUNTER — Other Ambulatory Visit: Payer: Self-pay | Admitting: Neurology

## 2019-12-20 DIAGNOSIS — I10 Essential (primary) hypertension: Secondary | ICD-10-CM | POA: Diagnosis not present

## 2019-12-20 DIAGNOSIS — M1711 Unilateral primary osteoarthritis, right knee: Secondary | ICD-10-CM | POA: Diagnosis not present

## 2019-12-20 DIAGNOSIS — G603 Idiopathic progressive neuropathy: Secondary | ICD-10-CM | POA: Diagnosis not present

## 2019-12-20 DIAGNOSIS — E7849 Other hyperlipidemia: Secondary | ICD-10-CM | POA: Diagnosis not present

## 2019-12-24 DIAGNOSIS — B351 Tinea unguium: Secondary | ICD-10-CM | POA: Diagnosis not present

## 2019-12-24 DIAGNOSIS — M2042 Other hammer toe(s) (acquired), left foot: Secondary | ICD-10-CM | POA: Diagnosis not present

## 2019-12-24 DIAGNOSIS — I739 Peripheral vascular disease, unspecified: Secondary | ICD-10-CM | POA: Diagnosis not present

## 2019-12-26 DIAGNOSIS — G603 Idiopathic progressive neuropathy: Secondary | ICD-10-CM | POA: Diagnosis not present

## 2019-12-26 DIAGNOSIS — M179 Osteoarthritis of knee, unspecified: Secondary | ICD-10-CM | POA: Diagnosis not present

## 2019-12-26 DIAGNOSIS — Z7689 Persons encountering health services in other specified circumstances: Secondary | ICD-10-CM | POA: Diagnosis not present

## 2019-12-26 DIAGNOSIS — Z7409 Other reduced mobility: Secondary | ICD-10-CM | POA: Diagnosis not present

## 2020-01-02 ENCOUNTER — Other Ambulatory Visit: Payer: Self-pay | Admitting: Neurology

## 2020-01-07 ENCOUNTER — Inpatient Hospital Stay: Payer: Medicare Other | Admitting: Hematology and Oncology

## 2020-01-07 NOTE — Assessment & Plan Note (Deleted)
06/06/2018:Left lumpectomy: Mucinous adenocarcinoma grade 1, 1.8 cm, DCIS, intermediate grade, negative for LV I or PNI, margins negative, DCIS focal less than 1 mm anterior margin, 0/4 lymph nodes negative, ER 90%, PR 0%, HER-2 negative, Ki-67 10%, T1CN0 stage Ia  Adjuvant radiation therapy at [facility]  Current treatment: Adjuvant antiestrogen therapy with anastrozole 1 mg daily x5 to 7 years Anastrozole toxicities:  Breast cancer surveillance: 1. Breast exam 01/07/2020: Benign 2. Mammogram 03/05/2019: Benign breast density category B  Return to clinic in 1 year for follow-up

## 2020-01-08 ENCOUNTER — Telehealth: Payer: Self-pay | Admitting: Hematology and Oncology

## 2020-01-08 NOTE — Telephone Encounter (Signed)
Called pt per 8/17 sch msg - no answer . Left message for patient to call back to reschedule .

## 2020-01-14 NOTE — Progress Notes (Signed)
Patient Care Team: Redmond School, MD as PCP - General (Internal Medicine) Iran Planas, MD as Consulting Physician (Orthopedic Surgery) Mauro Kaufmann, RN as Oncology Nurse Navigator Rockwell Germany, RN as Oncology Nurse Navigator Fanny Skates, MD as Consulting Physician (General Surgery) Nicholas Lose, MD as Consulting Physician (Hematology and Oncology) Meda Klinefelter, MD as Referring Physician (Radiation Oncology)  DIAGNOSIS:    ICD-10-CM   1. Malignant neoplasm of upper-outer quadrant of left breast in female, estrogen receptor positive ([place])  C50.412    Z17.0     SUMMARY OF ONCOLOGIC HISTORY: Oncology History  Malignant neoplasm of upper-outer quadrant of left breast in female, estrogen receptor positive ([place])  04/03/2018 Initial Diagnosis   Screening detected left breast mass 1.9 cm UOQ, ultrasound revealed 1.4 x 0.7 x 1.2 cm mass 2 o'clock position 8 cm from the nipple, 2 axillary lymph nodes with thickened cortices measuring 6 mm, Biopsy of the left breast mass IDC with mucinous features grade 1-2 with DCIS intermediate grade, lymph node benign, ER 90%, PR 0%, Ki-67 10%, HER-2 equivocal by IHC FISH negative ratio 1.13 copy #1.7, T1c N0 stage Ia   04/30/2018 Cancer Staging   Staging form: Breast, AJCC 8th Edition - Clinical stage from 04/30/2018: Stage IA (cT1c, cN0, cM0, G2, ER+, PR-, HER2-) - Signed by Nicholas Lose, MD on 04/30/2018   06/06/2018 Surgery   Left lumpectomy: Mucinous adenocarcinoma grade 1, 1.8 cm, DCIS, intermediate grade, negative for LV I or PNI, margins negative, DCIS focal less than 1 mm anterior margin, 0/4 lymph nodes negative, ER 90%, PR 0%, HER-2 negative, Ki-67 10%, T1CN0 stage Ia   06/14/2018 Cancer Staging   Staging form: Breast, AJCC 8th Edition - Pathologic stage from 06/14/2018: Stage IA (pT1c, pN0(sn), cM0, G1, ER+, PR-, HER2-) - Signed by Nicholas Lose, MD on 06/14/2018   07/23/2018 - 08/16/2018 Radiation Therapy   Adjuvant radiation  at [facility] with Dr. Francesca Jewett: 42.56 Gy in 16 treatments   09/2018 -  Anti-estrogen oral therapy   Anastrozole daily     CHIEF COMPLIANT: Follow-up of left breast cancer on anastrozole   INTERVAL HISTORY: Melissa Rosales is a 74 y.o. with above-mentioned history of left breast cancer treated with lumpectomy, radiation, and who is currently on antiestrogen therapy with anastrozole. Mammogram on 03/05/19 showed no evidence of malignancy bilaterally. She presents to the clinic today for follow-up.   ALLERGIES:  is allergic to keflex [cephalexin].  MEDICATIONS:  Current Outpatient Medications  Medication Sig Dispense Refill  . amLODipine (NORVASC) 10 MG tablet Take 10 mg by mouth daily.     Marland Kitchen anastrozole (ARIMIDEX) 1 MG tablet TAKE 1 TABLET BY MOUTH ONCE A DAY BEDTIME. 90 tablet 0  . aspirin EC 81 MG tablet Take 81 mg by mouth daily.    . Calcium Carb-Cholecalciferol (CALCIUM + D3 PO) Take 1 tablet by mouth daily.     . clotrimazole-betamethasone (LOTRISONE) cream Apply 1 application topically 2 (two) times daily as needed (for irritation).     . Cyanocobalamin (VITAMIN B-12 PO) Take 1 tablet by mouth daily.    . DULoxetine (CYMBALTA) 60 MG capsule TAKE ONE CAPSULE BY MOUTH TWICE DAILY. 60 capsule 2  . enalapril (VASOTEC) 20 MG tablet Take 20 mg by mouth daily.     Marland Kitchen FIBER FORMULA CAPS Take 1 capsule by mouth daily.    Marland Kitchen gabapentin (NEURONTIN) 600 MG tablet TAKE (1) TABLET BY MOUTH (4) TIMES DAILY. 360 tablet 1  . hydrochlorothiazide (HYDRODIURIL) 25 MG  tablet Take 25 mg by mouth daily.    Marland Kitchen lactulose (CHRONULAC) 10 GM/15ML solution Take 10-20 g by mouth at bedtime as needed for moderate constipation.     . Multiple Vitamins-Minerals (MULTIVITAMIN WITH MINERALS) tablet Take 1 tablet by mouth daily.    . Omega 3 1000 MG CAPS Take 1,000 mg by mouth daily.     . potassium chloride (K-DUR) 10 MEQ tablet Take 10 mEq by mouth daily.    . ranitidine (ZANTAC) 150 MG tablet Take 150 mg by mouth daily.      . vitamin C (ASCORBIC ACID) 500 MG tablet Take 500 mg by mouth daily.     No current facility-administered medications for this visit.    PHYSICAL EXAMINATION: ECOG PERFORMANCE STATUS: 1 - Symptomatic but completely ambulatory  Vitals:   01/15/20 1517  BP: 136/67  Pulse: 72  Resp: 17  Temp: 98.2 F (36.8 C)  SpO2: 98%   Filed Weights   01/15/20 1517  Weight: 211 lb 12.8 oz (96.1 kg)    BREAST: No palpable masses or nodules in either right or left breasts. No palpable axillary supraclavicular or infraclavicular adenopathy no breast tenderness or nipple discharge. (exam performed in the presence of a chaperone)  LABORATORY DATA:  I have reviewed the data as listed CMP Latest Ref Rng & Units 05/31/2018 11/07/2016 10/28/2016  Glucose 70 - 99 mg/dL 89 91 87  BUN 8 - 23 mg/dL $Remove'14 18 18  'zJvDnYi$ Creatinine 0.44 - 1.00 mg/dL 1.03(H) 0.99 1.35(H)  Sodium 135 - 145 mmol/L 136 141 137  Potassium 3.5 - 5.1 mmol/L 3.7 3.3(L) 3.8  Chloride 98 - 111 mmol/L 101 105 100(L)  CO2 22 - 32 mmol/L $RemoveB'27 26 30  'woYVtOSZ$ Calcium 8.9 - 10.3 mg/dL 9.7 9.5 9.9  Total Protein 6.5 - 8.1 g/dL 8.0 - -  Total Bilirubin 0.3 - 1.2 mg/dL 0.9 - -  Alkaline Phos 38 - 126 U/L 46 - -  AST 15 - 41 U/L 23 - -  ALT 0 - 44 U/L 19 - -    Lab Results  Component Value Date   WBC 7.7 05/31/2018   HGB 13.1 05/31/2018   HCT 42.9 05/31/2018   MCV 88.3 05/31/2018   PLT 205 05/31/2018   NEUTROABS 4.4 05/31/2018    ASSESSMENT & PLAN:  Malignant neoplasm of upper-outer quadrant of left breast in female, estrogen receptor positive ([place]) 06/06/2018:Left lumpectomy: Mucinous adenocarcinoma grade 1, 1.8 cm, DCIS, intermediate grade, negative for LV I or PNI, margins negative, DCIS focal less than 1 mm anterior margin, 0/4 lymph nodes negative, ER 90%, PR 0%, HER-2 negative, Ki-67 10%, T1CN0 stage Ia  Adjuvant radiation therapy at [facility]  Current treatment: Adjuvant antiestrogen therapy with anastrozole 1 mg daily x5 to 7  years Anastrozole toxicities: Tolerating anastrozole extremely well. Occasional hot flashes and occasional muscle aches and pains.  Breast cancer surveillance: 1. Breast exam 01/14/2020: Benign 2. mammogram 03/05/2019: Benign breast density category B  Legally blind. Return to clinic in  1 year for follow-up    No orders of the defined types were placed in this encounter.  The patient has a good understanding of the overall plan. she agrees with it. she will call with any problems that may develop before the next visit here.  Total time spent: 20 mins including face to face time and time spent for planning, charting and coordination of care  Nicholas Lose, MD 01/15/2020  I, Cloyde Reams Dorshimer, am acting as scribe for Dr. Loleta Dicker  Yassmin Binegar.  I have reviewed the above documentation for accuracy and completeness, and I agree with the above.

## 2020-01-15 ENCOUNTER — Inpatient Hospital Stay: Payer: Medicare Other | Attending: Hematology and Oncology | Admitting: Hematology and Oncology

## 2020-01-15 ENCOUNTER — Other Ambulatory Visit: Payer: Self-pay

## 2020-01-15 DIAGNOSIS — H548 Legal blindness, as defined in USA: Secondary | ICD-10-CM | POA: Diagnosis not present

## 2020-01-15 DIAGNOSIS — C50412 Malignant neoplasm of upper-outer quadrant of left female breast: Secondary | ICD-10-CM | POA: Insufficient documentation

## 2020-01-15 DIAGNOSIS — Z923 Personal history of irradiation: Secondary | ICD-10-CM | POA: Diagnosis not present

## 2020-01-15 DIAGNOSIS — Z170 Estrogen receptor positive status [ER+]: Secondary | ICD-10-CM | POA: Diagnosis not present

## 2020-01-15 MED ORDER — ANASTROZOLE 1 MG PO TABS: 1.0000 mg | ORAL_TABLET | Freq: Every day | ORAL | 3 refills | Status: DC

## 2020-01-15 NOTE — Assessment & Plan Note (Signed)
06/06/2018:Left lumpectomy: Mucinous adenocarcinoma grade 1, 1.8 cm, DCIS, intermediate grade, negative for LV I or PNI, margins negative, DCIS focal less than 1 mm anterior margin, 0/4 lymph nodes negative, ER 90%, PR 0%, HER-2 negative, Ki-67 10%, T1CN0 stage Ia  Adjuvant radiation therapy at [facility]  Current treatment: Adjuvant antiestrogen therapy with anastrozole 1 mg daily x5 to 7 years Anastrozole toxicities:   Breast cancer surveillance: 1. Breast exam 01/14/2020: Benign 2. mammogram 03/05/2019: Benign breast density category B  Return to clinic in  1 year for follow-up

## 2020-01-16 ENCOUNTER — Telehealth: Payer: Self-pay | Admitting: Hematology and Oncology

## 2020-01-16 NOTE — Telephone Encounter (Signed)
Scheduled per 8/25 los. Called pt no answer unable to leave a msg. Mailing appt letter and calendar printout

## 2020-01-21 DIAGNOSIS — M1711 Unilateral primary osteoarthritis, right knee: Secondary | ICD-10-CM | POA: Diagnosis not present

## 2020-01-21 DIAGNOSIS — E7849 Other hyperlipidemia: Secondary | ICD-10-CM | POA: Diagnosis not present

## 2020-01-21 DIAGNOSIS — I10 Essential (primary) hypertension: Secondary | ICD-10-CM | POA: Diagnosis not present

## 2020-01-21 DIAGNOSIS — G603 Idiopathic progressive neuropathy: Secondary | ICD-10-CM | POA: Diagnosis not present

## 2020-01-24 DIAGNOSIS — I10 Essential (primary) hypertension: Secondary | ICD-10-CM | POA: Diagnosis not present

## 2020-01-24 DIAGNOSIS — Z1389 Encounter for screening for other disorder: Secondary | ICD-10-CM | POA: Diagnosis not present

## 2020-01-24 DIAGNOSIS — M179 Osteoarthritis of knee, unspecified: Secondary | ICD-10-CM | POA: Diagnosis not present

## 2020-01-24 DIAGNOSIS — Z0000 Encounter for general adult medical examination without abnormal findings: Secondary | ICD-10-CM | POA: Diagnosis not present

## 2020-01-24 DIAGNOSIS — K219 Gastro-esophageal reflux disease without esophagitis: Secondary | ICD-10-CM | POA: Diagnosis not present

## 2020-02-18 ENCOUNTER — Other Ambulatory Visit: Payer: Self-pay | Admitting: Adult Health

## 2020-02-18 DIAGNOSIS — Z853 Personal history of malignant neoplasm of breast: Secondary | ICD-10-CM

## 2020-02-20 DIAGNOSIS — M1711 Unilateral primary osteoarthritis, right knee: Secondary | ICD-10-CM | POA: Diagnosis not present

## 2020-02-20 DIAGNOSIS — I10 Essential (primary) hypertension: Secondary | ICD-10-CM | POA: Diagnosis not present

## 2020-02-20 DIAGNOSIS — E7849 Other hyperlipidemia: Secondary | ICD-10-CM | POA: Diagnosis not present

## 2020-02-25 ENCOUNTER — Ambulatory Visit: Payer: Medicare Other | Admitting: Neurology

## 2020-02-26 DIAGNOSIS — Z23 Encounter for immunization: Secondary | ICD-10-CM | POA: Diagnosis not present

## 2020-03-03 ENCOUNTER — Other Ambulatory Visit: Payer: Self-pay | Admitting: Neurology

## 2020-03-05 ENCOUNTER — Other Ambulatory Visit: Payer: Self-pay

## 2020-03-05 ENCOUNTER — Ambulatory Visit
Admission: RE | Admit: 2020-03-05 | Discharge: 2020-03-05 | Disposition: A | Discharge status: Home / Self Care | Payer: Medicare Other | Source: Ambulatory Visit | Attending: Adult Health | Admitting: Adult Health

## 2020-03-05 DIAGNOSIS — Z853 Personal history of malignant neoplasm of breast: Secondary | ICD-10-CM | POA: Diagnosis not present

## 2020-03-05 DIAGNOSIS — R928 Other abnormal and inconclusive findings on diagnostic imaging of breast: Secondary | ICD-10-CM | POA: Diagnosis not present

## 2020-03-20 DIAGNOSIS — H905 Unspecified sensorineural hearing loss: Secondary | ICD-10-CM | POA: Diagnosis not present

## 2020-03-27 DIAGNOSIS — E7849 Other hyperlipidemia: Secondary | ICD-10-CM | POA: Diagnosis not present

## 2020-03-27 DIAGNOSIS — M1711 Unilateral primary osteoarthritis, right knee: Secondary | ICD-10-CM | POA: Diagnosis not present

## 2020-03-30 DIAGNOSIS — M2042 Other hammer toe(s) (acquired), left foot: Secondary | ICD-10-CM | POA: Diagnosis not present

## 2020-03-30 DIAGNOSIS — B351 Tinea unguium: Secondary | ICD-10-CM | POA: Diagnosis not present

## 2020-03-30 DIAGNOSIS — I739 Peripheral vascular disease, unspecified: Secondary | ICD-10-CM | POA: Diagnosis not present

## 2020-03-31 ENCOUNTER — Other Ambulatory Visit (HOSPITAL_COMMUNITY): Payer: Self-pay | Admitting: Family Medicine

## 2020-03-31 DIAGNOSIS — E289 Ovarian dysfunction, unspecified: Secondary | ICD-10-CM

## 2020-04-21 DIAGNOSIS — I10 Essential (primary) hypertension: Secondary | ICD-10-CM | POA: Diagnosis not present

## 2020-04-21 DIAGNOSIS — M1711 Unilateral primary osteoarthritis, right knee: Secondary | ICD-10-CM | POA: Diagnosis not present

## 2020-04-21 DIAGNOSIS — E7849 Other hyperlipidemia: Secondary | ICD-10-CM | POA: Diagnosis not present

## 2020-05-13 DIAGNOSIS — C50412 Malignant neoplasm of upper-outer quadrant of left female breast: Secondary | ICD-10-CM | POA: Diagnosis not present

## 2020-05-26 ENCOUNTER — Other Ambulatory Visit (HOSPITAL_COMMUNITY): Payer: Self-pay | Admitting: Family Medicine

## 2020-05-26 DIAGNOSIS — E2839 Other primary ovarian failure: Secondary | ICD-10-CM

## 2020-05-29 DIAGNOSIS — N342 Other urethritis: Secondary | ICD-10-CM | POA: Diagnosis not present

## 2020-06-01 ENCOUNTER — Other Ambulatory Visit: Payer: Self-pay | Admitting: Neurology

## 2020-06-04 DIAGNOSIS — Z961 Presence of intraocular lens: Secondary | ICD-10-CM | POA: Diagnosis not present

## 2020-06-17 ENCOUNTER — Ambulatory Visit: Payer: Medicare Other | Admitting: Neurology

## 2020-06-19 DIAGNOSIS — B351 Tinea unguium: Secondary | ICD-10-CM | POA: Diagnosis not present

## 2020-06-19 DIAGNOSIS — I739 Peripheral vascular disease, unspecified: Secondary | ICD-10-CM | POA: Diagnosis not present

## 2020-06-19 DIAGNOSIS — S90122D Contusion of left lesser toe(s) without damage to nail, subsequent encounter: Secondary | ICD-10-CM | POA: Diagnosis not present

## 2020-06-19 DIAGNOSIS — M2042 Other hammer toe(s) (acquired), left foot: Secondary | ICD-10-CM | POA: Diagnosis not present

## 2020-06-20 DIAGNOSIS — I10 Essential (primary) hypertension: Secondary | ICD-10-CM | POA: Diagnosis not present

## 2020-06-20 DIAGNOSIS — E7849 Other hyperlipidemia: Secondary | ICD-10-CM | POA: Diagnosis not present

## 2020-06-20 DIAGNOSIS — M1711 Unilateral primary osteoarthritis, right knee: Secondary | ICD-10-CM | POA: Diagnosis not present

## 2020-07-02 ENCOUNTER — Other Ambulatory Visit: Payer: Self-pay | Admitting: Neurology

## 2020-07-09 DIAGNOSIS — Z7689 Persons encountering health services in other specified circumstances: Secondary | ICD-10-CM | POA: Diagnosis not present

## 2020-07-09 DIAGNOSIS — L02612 Cutaneous abscess of left foot: Secondary | ICD-10-CM | POA: Diagnosis not present

## 2020-07-09 DIAGNOSIS — L03032 Cellulitis of left toe: Secondary | ICD-10-CM | POA: Diagnosis not present

## 2020-07-20 DIAGNOSIS — I10 Essential (primary) hypertension: Secondary | ICD-10-CM | POA: Diagnosis not present

## 2020-07-20 DIAGNOSIS — M1711 Unilateral primary osteoarthritis, right knee: Secondary | ICD-10-CM | POA: Diagnosis not present

## 2020-07-20 DIAGNOSIS — E7849 Other hyperlipidemia: Secondary | ICD-10-CM | POA: Diagnosis not present

## 2020-07-24 DIAGNOSIS — Z7689 Persons encountering health services in other specified circumstances: Secondary | ICD-10-CM | POA: Diagnosis not present

## 2020-07-24 DIAGNOSIS — B351 Tinea unguium: Secondary | ICD-10-CM | POA: Diagnosis not present

## 2020-07-24 DIAGNOSIS — L84 Corns and callosities: Secondary | ICD-10-CM | POA: Diagnosis not present

## 2020-07-31 ENCOUNTER — Other Ambulatory Visit: Payer: Self-pay | Admitting: Neurology

## 2020-08-12 ENCOUNTER — Other Ambulatory Visit: Payer: Self-pay

## 2020-08-12 ENCOUNTER — Ambulatory Visit (HOSPITAL_COMMUNITY)
Admission: RE | Admit: 2020-08-12 | Discharge: 2020-08-12 | Disposition: A | Discharge status: Home / Self Care | Payer: Medicare Other | Source: Ambulatory Visit | Attending: Family Medicine | Admitting: Family Medicine

## 2020-08-12 DIAGNOSIS — E2839 Other primary ovarian failure: Secondary | ICD-10-CM | POA: Insufficient documentation

## 2020-08-12 DIAGNOSIS — M8588 Other specified disorders of bone density and structure, other site: Secondary | ICD-10-CM | POA: Diagnosis not present

## 2020-08-19 DIAGNOSIS — I10 Essential (primary) hypertension: Secondary | ICD-10-CM | POA: Diagnosis not present

## 2020-08-19 DIAGNOSIS — E7849 Other hyperlipidemia: Secondary | ICD-10-CM | POA: Diagnosis not present

## 2020-09-01 ENCOUNTER — Other Ambulatory Visit: Payer: Self-pay | Admitting: Neurology

## 2020-09-02 ENCOUNTER — Ambulatory Visit: Payer: Medicare Other | Admitting: Neurology

## 2020-09-19 DIAGNOSIS — I10 Essential (primary) hypertension: Secondary | ICD-10-CM | POA: Diagnosis not present

## 2020-09-19 DIAGNOSIS — E7849 Other hyperlipidemia: Secondary | ICD-10-CM | POA: Diagnosis not present

## 2020-10-07 DIAGNOSIS — H905 Unspecified sensorineural hearing loss: Secondary | ICD-10-CM | POA: Diagnosis not present

## 2020-10-27 DIAGNOSIS — J209 Acute bronchitis, unspecified: Secondary | ICD-10-CM | POA: Diagnosis not present

## 2020-10-27 DIAGNOSIS — T783XXA Angioneurotic edema, initial encounter: Secondary | ICD-10-CM | POA: Diagnosis not present

## 2020-10-27 DIAGNOSIS — T50905A Adverse effect of unspecified drugs, medicaments and biological substances, initial encounter: Secondary | ICD-10-CM | POA: Diagnosis not present

## 2020-10-28 DIAGNOSIS — B351 Tinea unguium: Secondary | ICD-10-CM | POA: Diagnosis not present

## 2020-10-28 DIAGNOSIS — Z7689 Persons encountering health services in other specified circumstances: Secondary | ICD-10-CM | POA: Diagnosis not present

## 2020-10-28 DIAGNOSIS — L84 Corns and callosities: Secondary | ICD-10-CM | POA: Diagnosis not present

## 2020-11-16 ENCOUNTER — Ambulatory Visit: Payer: Medicare Other | Admitting: Neurology

## 2020-11-19 DIAGNOSIS — I10 Essential (primary) hypertension: Secondary | ICD-10-CM | POA: Diagnosis not present

## 2020-11-19 DIAGNOSIS — E7849 Other hyperlipidemia: Secondary | ICD-10-CM | POA: Diagnosis not present

## 2020-12-01 DIAGNOSIS — U071 COVID-19: Secondary | ICD-10-CM | POA: Diagnosis not present

## 2020-12-30 ENCOUNTER — Other Ambulatory Visit: Payer: Self-pay | Admitting: Neurology

## 2021-01-14 ENCOUNTER — Ambulatory Visit: Payer: Medicare Other | Admitting: Hematology and Oncology

## 2021-01-18 NOTE — Assessment & Plan Note (Deleted)
/  15/2020:Left lumpectomy: Mucinous adenocarcinoma grade 1, 1.8 cm, DCIS, intermediate grade, negative for LV I or PNI, margins negative, DCIS focal less than 1 mm anterior margin, 0/4 lymph nodes negative, ER 90%, PR 0%, HER-2 negative, Ki-67 10%, T1CN0 stage Ia  Adjuvant radiation therapy at [facility]  Current treatment:Adjuvant antiestrogen therapy with anastrozole 1 mg daily x5 to 7 years Anastrozole toxicities: Tolerating anastrozole extremely well. Occasional hot flashes and occasional muscle aches and pains.  Breast cancer surveillance: 1. Breast exam 01/14/2020: Benign 2. mammogram 03/05/20 Benign breast density category B  Legally blind. Return to clinic in 1 year for follow-up

## 2021-01-19 ENCOUNTER — Ambulatory Visit: Payer: Medicare Other | Admitting: Hematology and Oncology

## 2021-01-19 DIAGNOSIS — Z170 Estrogen receptor positive status [ER+]: Secondary | ICD-10-CM

## 2021-01-28 ENCOUNTER — Other Ambulatory Visit: Payer: Self-pay | Admitting: Hematology and Oncology

## 2021-01-28 ENCOUNTER — Other Ambulatory Visit: Payer: Self-pay | Admitting: Neurology

## 2021-02-02 ENCOUNTER — Ambulatory Visit: Payer: Medicare Other | Admitting: Hematology and Oncology

## 2021-02-09 DIAGNOSIS — Z7689 Persons encountering health services in other specified circumstances: Secondary | ICD-10-CM | POA: Diagnosis not present

## 2021-02-10 NOTE — Progress Notes (Signed)
Patient Care Team: Redmond School, MD as PCP - General (Internal Medicine) Iran Planas, MD as Consulting Physician (Orthopedic Surgery) Mauro Kaufmann, RN as Oncology Nurse Navigator Rockwell Germany, RN as Oncology Nurse Navigator Fanny Skates, MD as Consulting Physician (General Surgery) Nicholas Lose, MD as Consulting Physician (Hematology and Oncology) Meda Klinefelter, MD as Referring Physician (Radiation Oncology)  DIAGNOSIS:    ICD-10-CM   1. Malignant neoplasm of upper-outer quadrant of left breast in female, estrogen receptor positive ([place])  C50.412    Z17.0       SUMMARY OF ONCOLOGIC HISTORY: Oncology History  Malignant neoplasm of upper-outer quadrant of left breast in female, estrogen receptor positive ([place])  04/03/2018 Initial Diagnosis   Screening detected left breast mass 1.9 cm UOQ, ultrasound revealed 1.4 x 0.7 x 1.2 cm mass 2 o'clock position 8 cm from the nipple, 2 axillary lymph nodes with thickened cortices measuring 6 mm, Biopsy of the left breast mass IDC with mucinous features grade 1-2 with DCIS intermediate grade, lymph node benign, ER 90%, PR 0%, Ki-67 10%, HER-2 equivocal by IHC FISH negative ratio 1.13 copy #1.7, T1c N0 stage Ia   04/30/2018 Cancer Staging   Staging form: Breast, AJCC 8th Edition - Clinical stage from 04/30/2018: Stage IA (cT1c, cN0, cM0, G2, ER+, PR-, HER2-) - Signed by Nicholas Lose, MD on 04/30/2018   06/06/2018 Surgery   Left lumpectomy: Mucinous adenocarcinoma grade 1, 1.8 cm, DCIS, intermediate grade, negative for LV I or PNI, margins negative, DCIS focal less than 1 mm anterior margin, 0/4 lymph nodes negative, ER 90%, PR 0%, HER-2 negative, Ki-67 10%, T1CN0 stage Ia   06/14/2018 Cancer Staging   Staging form: Breast, AJCC 8th Edition - Pathologic stage from 06/14/2018: Stage IA (pT1c, pN0(sn), cM0, G1, ER+, PR-, HER2-) - Signed by Nicholas Lose, MD on 06/14/2018   07/23/2018 - 08/16/2018 Radiation Therapy   Adjuvant  radiation at [facility] with Dr. Francesca Jewett: 42.56 Gy in 16 treatments   09/2018 -  Anti-estrogen oral therapy   Anastrozole daily     CHIEF COMPLIANT: Follow-up of left breast cancer on anastrozole   INTERVAL HISTORY: Melissa Rosales is a 75 y.o. with above-mentioned history of left breast cancer treated with lumpectomy, radiation, and who is currently on antiestrogen therapy with anastrozole. Mammogram on 03/05/2020 showed no evidence of malignancy bilaterally. She presents to the clinic today for follow-up.  She is in a wheelchair.  She is complaining of itching on the back but she cannot reach that site.  Denies any nausea or vomiting.  Denies any side effects to anastrozole therapy.  ALLERGIES:  is allergic to keflex [cephalexin].  MEDICATIONS:  Current Outpatient Medications  Medication Sig Dispense Refill   amLODipine (NORVASC) 10 MG tablet Take 10 mg by mouth daily.      anastrozole (ARIMIDEX) 1 MG tablet Take 1 tablet (1 mg total) by mouth daily. 90 tablet 3   aspirin EC 81 MG tablet Take 81 mg by mouth daily.     Calcium Carb-Cholecalciferol (CALCIUM + D3 PO) Take 1 tablet by mouth daily.      clotrimazole-betamethasone (LOTRISONE) cream Apply 1 application topically 2 (two) times daily as needed (for irritation).      Cyanocobalamin (VITAMIN B-12 PO) Take 1 tablet by mouth daily.     DULoxetine (CYMBALTA) 60 MG capsule TAKE ONE CAPSULE BY MOUTH TWICE DAILY. 60 capsule 2   enalapril (VASOTEC) 20 MG tablet Take 20 mg by mouth daily.  FIBER FORMULA CAPS Take 1 capsule by mouth daily.     gabapentin (NEURONTIN) 600 MG tablet TAKE (1) TABLET BY MOUTH (4) TIMES DAILY. 120 tablet 2   hydrochlorothiazide (HYDRODIURIL) 25 MG tablet Take 25 mg by mouth daily.     lactulose (CHRONULAC) 10 GM/15ML solution Take 10-20 g by mouth at bedtime as needed for moderate constipation.      Multiple Vitamins-Minerals (MULTIVITAMIN WITH MINERALS) tablet Take 1 tablet by mouth daily.     Omega 3 1000 MG  CAPS Take 1,000 mg by mouth daily.      potassium chloride (K-DUR) 10 MEQ tablet Take 10 mEq by mouth daily.     ranitidine (ZANTAC) 150 MG tablet Take 150 mg by mouth daily.      vitamin C (ASCORBIC ACID) 500 MG tablet Take 500 mg by mouth daily.     No current facility-administered medications for this visit.    PHYSICAL EXAMINATION: ECOG PERFORMANCE STATUS: 1 - Symptomatic but completely ambulatory  Vitals:   02/11/21 1409  BP: 130/65  Pulse: 66  Resp: 18  Temp: 97.8 F (36.6 C)  SpO2: 98%   Filed Weights   02/11/21 1409  Weight: 209 lb 12.8 oz (95.2 kg)      LABORATORY DATA:  I have reviewed the data as listed CMP Latest Ref Rng & Units 05/31/2018 11/07/2016 10/28/2016  Glucose 70 - 99 mg/dL 89 91 87  BUN 8 - 23 mg/dL $Remove'14 18 18  'WxLmcgf$ Creatinine 0.44 - 1.00 mg/dL 1.03(H) 0.99 1.35(H)  Sodium 135 - 145 mmol/L 136 141 137  Potassium 3.5 - 5.1 mmol/L 3.7 3.3(L) 3.8  Chloride 98 - 111 mmol/L 101 105 100(L)  CO2 22 - 32 mmol/L $RemoveB'27 26 30  'sGeDchcN$ Calcium 8.9 - 10.3 mg/dL 9.7 9.5 9.9  Total Protein 6.5 - 8.1 g/dL 8.0 - -  Total Bilirubin 0.3 - 1.2 mg/dL 0.9 - -  Alkaline Phos 38 - 126 U/L 46 - -  AST 15 - 41 U/L 23 - -  ALT 0 - 44 U/L 19 - -    Lab Results  Component Value Date   WBC 7.7 05/31/2018   HGB 13.1 05/31/2018   HCT 42.9 05/31/2018   MCV 88.3 05/31/2018   PLT 205 05/31/2018   NEUTROABS 4.4 05/31/2018    ASSESSMENT & PLAN:  Malignant neoplasm of upper-outer quadrant of left breast in female, estrogen receptor positive ([place]) 06/06/2018:Left lumpectomy: Mucinous adenocarcinoma grade 1, 1.8 cm, DCIS, intermediate grade, negative for LV I or PNI, margins negative, DCIS focal less than 1 mm anterior margin, 0/4 lymph nodes negative, ER 90%, PR 0%, HER-2 negative, Ki-67 10%, T1CN0 stage Ia   Adjuvant radiation therapy at [facility]   Current treatment: Adjuvant antiestrogen therapy with anastrozole 1 mg daily x5 to 7 years Anastrozole toxicities: Tolerating anastrozole extremely  well. Occasional hot flashes and occasional muscle aches and pains.   mammogram 03/05/2020: Benign breast density category B   Legally blind. Return to clinic in 1 year for follow-up    No orders of the defined types were placed in this encounter.  The patient has a good understanding of the overall plan. she agrees with it. she will call with any problems that may develop before the next visit here.  Total time spent: 20 mins including face to face time and time spent for planning, charting and coordination of care  Rulon Eisenmenger, MD, MPH 02/11/2021  I, Thana Ates, am acting as scribe for Dr. Nicholas Lose.  I have reviewed the above documentation for accuracy and completeness, and I agree with the above.

## 2021-02-11 ENCOUNTER — Inpatient Hospital Stay: Payer: Medicare Other | Attending: Hematology and Oncology | Admitting: Hematology and Oncology

## 2021-02-11 ENCOUNTER — Other Ambulatory Visit: Payer: Self-pay

## 2021-02-11 DIAGNOSIS — H548 Legal blindness, as defined in USA: Secondary | ICD-10-CM | POA: Insufficient documentation

## 2021-02-11 DIAGNOSIS — Z170 Estrogen receptor positive status [ER+]: Secondary | ICD-10-CM | POA: Diagnosis not present

## 2021-02-11 DIAGNOSIS — Z923 Personal history of irradiation: Secondary | ICD-10-CM | POA: Insufficient documentation

## 2021-02-11 DIAGNOSIS — C50412 Malignant neoplasm of upper-outer quadrant of left female breast: Secondary | ICD-10-CM | POA: Insufficient documentation

## 2021-02-11 DIAGNOSIS — Z993 Dependence on wheelchair: Secondary | ICD-10-CM | POA: Diagnosis not present

## 2021-02-11 MED ORDER — ANASTROZOLE 1 MG PO TABS: 1.0000 mg | ORAL_TABLET | Freq: Every day | ORAL | 3 refills | Status: DC

## 2021-02-11 NOTE — Assessment & Plan Note (Signed)
06/06/2018:Left lumpectomy: Mucinous adenocarcinoma grade 1, 1.8 cm, DCIS, intermediate grade, negative for LV I or PNI, margins negative, DCIS focal less than 1 mm anterior margin, 0/4 lymph nodes negative, ER 90%, PR 0%, HER-2 negative, Ki-67 10%, T1CN0 stage Ia  Adjuvant radiation therapy at [facility]  Current treatment:Adjuvant antiestrogen therapy with anastrozole 1 mg daily x5 to 7 years Anastrozole toxicities: Tolerating anastrozole extremely well. Occasional hot flashes and occasional muscle aches and pains.  Breast cancer surveillance: 1. Breast exam 02/11/2021: Benign 2. mammogram 03/05/2020: Benign breast density category B  Legally blind. Return to clinic in 1 year for follow-up

## 2021-03-08 DIAGNOSIS — Z7689 Persons encountering health services in other specified circumstances: Secondary | ICD-10-CM | POA: Diagnosis not present

## 2021-03-25 ENCOUNTER — Encounter (HOSPITAL_COMMUNITY): Payer: Self-pay | Admitting: *Deleted

## 2021-03-25 ENCOUNTER — Emergency Department (HOSPITAL_COMMUNITY)
Admission: EM | Admit: 2021-03-25 | Discharge: 2021-03-25 | Disposition: A | Discharge status: Home / Self Care | Payer: Medicare Other | Attending: Emergency Medicine | Admitting: Emergency Medicine

## 2021-03-25 ENCOUNTER — Emergency Department (HOSPITAL_COMMUNITY): Payer: Medicare Other

## 2021-03-25 DIAGNOSIS — I10 Essential (primary) hypertension: Secondary | ICD-10-CM | POA: Insufficient documentation

## 2021-03-25 DIAGNOSIS — R1031 Right lower quadrant pain: Secondary | ICD-10-CM | POA: Diagnosis present

## 2021-03-25 DIAGNOSIS — Z7982 Long term (current) use of aspirin: Secondary | ICD-10-CM | POA: Insufficient documentation

## 2021-03-25 DIAGNOSIS — N3000 Acute cystitis without hematuria: Secondary | ICD-10-CM | POA: Diagnosis not present

## 2021-03-25 DIAGNOSIS — R109 Unspecified abdominal pain: Secondary | ICD-10-CM | POA: Diagnosis not present

## 2021-03-25 DIAGNOSIS — Z853 Personal history of malignant neoplasm of breast: Secondary | ICD-10-CM | POA: Insufficient documentation

## 2021-03-25 DIAGNOSIS — Z79899 Other long term (current) drug therapy: Secondary | ICD-10-CM | POA: Diagnosis not present

## 2021-03-25 LAB — COMPREHENSIVE METABOLIC PANEL
ALT: 22 U/L (ref 0–44)
AST: 24 U/L (ref 15–41)
Albumin: 4.3 g/dL (ref 3.5–5.0)
Alkaline Phosphatase: 53 U/L (ref 38–126)
Anion gap: 8 (ref 5–15)
BUN: 18 mg/dL (ref 8–23)
CO2: 28 mmol/L (ref 22–32)
Calcium: 9.9 mg/dL (ref 8.9–10.3)
Chloride: 101 mmol/L (ref 98–111)
Creatinine, Ser: 0.95 mg/dL (ref 0.44–1.00)
GFR, Estimated: >60 mL/min (ref >60)
Glucose, Bld: 84 mg/dL (ref 70–99)
Potassium: 4.1 mmol/L (ref 3.5–5.1)
Sodium: 137 mmol/L (ref 135–145)
Total Bilirubin: 0.7 mg/dL (ref 0.3–1.2)
Total Protein: 8 g/dL (ref 6.5–8.1)

## 2021-03-25 LAB — URINALYSIS, ROUTINE W REFLEX MICROSCOPIC
Bacteria, UA: NONE SEEN
Bilirubin Urine: NEGATIVE
Glucose, UA: NEGATIVE mg/dL
Hgb urine dipstick: NEGATIVE
Ketones, ur: NEGATIVE mg/dL
Nitrite: NEGATIVE
Protein, ur: NEGATIVE mg/dL
Specific Gravity, Urine: 1.017 (ref 1.005–1.030)
pH: 7 (ref 5.0–8.0)

## 2021-03-25 LAB — CBC WITH DIFFERENTIAL/PLATELET
Abs Immature Granulocytes: 0.02 10*3/uL (ref 0.00–0.07)
Basophils Absolute: 0 10*3/uL (ref 0.0–0.1)
Basophils Relative: 0 %
Eosinophils Absolute: 0 10*3/uL (ref 0.0–0.5)
Eosinophils Relative: 1 %
HCT: 37.2 % (ref 36.0–46.0)
Hemoglobin: 11.8 g/dL — ABNORMAL LOW (ref 12.0–15.0)
Immature Granulocytes: 0 %
Lymphocytes Relative: 26 %
Lymphs Abs: 1.6 10*3/uL (ref 0.7–4.0)
MCH: 29 pg (ref 26.0–34.0)
MCHC: 31.7 g/dL (ref 30.0–36.0)
MCV: 91.4 fL (ref 80.0–100.0)
Monocytes Absolute: 0.5 10*3/uL (ref 0.1–1.0)
Monocytes Relative: 9 %
Neutro Abs: 4 10*3/uL (ref 1.7–7.7)
Neutrophils Relative %: 64 %
Platelets: 220 10*3/uL (ref 150–400)
RBC: 4.07 MIL/uL (ref 3.87–5.11)
RDW: 15 % (ref 11.5–15.5)
WBC: 6.3 10*3/uL (ref 4.0–10.5)
nRBC: 0 % (ref 0.0–0.2)

## 2021-03-25 LAB — LIPASE, BLOOD: Lipase: 87 U/L — ABNORMAL HIGH (ref 11–51)

## 2021-03-25 MED ORDER — OXYCODONE-ACETAMINOPHEN 5-325 MG PO TABS
1.0000 | ORAL_TABLET | Freq: Three times a day (TID) | ORAL | 0 refills | Status: AC | PRN
Start: 2021-03-25 — End: 2021-03-27

## 2021-03-25 MED ORDER — IOHEXOL 300 MG/ML  SOLN
100.0000 mL | Freq: Once | INTRAMUSCULAR | Status: AC | PRN
  Administered 2021-03-25: 80 mL via INTRAVENOUS

## 2021-03-25 MED ORDER — SULFAMETHOXAZOLE-TRIMETHOPRIM 800-160 MG PO TABS: 1.0000 | ORAL_TABLET | Freq: Two times a day (BID) | ORAL | 0 refills | Status: AC

## 2021-03-25 NOTE — ED Notes (Signed)
Iv removed and pt dressed per P.A. orders Iv catheter intact no bleeding or swelling at removal site noted. Lattie Haw

## 2021-03-25 NOTE — ED Provider Notes (Signed)
[facility] EMERGENCY DEPARTMENT Provider Note   CSN: [id number] Arrival date & time: 03/25/21  1458     History Chief Complaint  Patient presents with   Pelvic Pain    Melissa Rosales is a 75 y.o. female.  HPI  Patient with significant medical history of arthritis, left-sided breast cancer status postlumpectomy currently on anti-hormonal medication presents to the emergency department chief complaint of lower stomach pain.  Patient states pain started approximately 2 days ago, she describes it more as a pressure-like sensation, pressure is worse when she is walking, she feels in the suprapubic region as well as the right lower quadrant, does not radiate, no associated urinary symptoms, denies urinary frequency, urgency, difficulty urination, denies any flank or side pain, no nausea, vomiting, diarrhea, denies any fevers, chills, chest pain, shortness of breath, general body aches.  He states she is never had this in the past, she states that increasing intra-abdominal pressure does not make the pain worse, she does not feel a bulge between her legs during urination.   Past Medical History:  Diagnosis Date   Acid reflux    Arthritis    Cancer ([place])    breast   Cervical myelopathy ([place]) 05/17/2017   C4-5   Hypertension    Personal history of radiation therapy     Patient Active Problem List   Diagnosis Date Noted   Malignant neoplasm of upper-outer quadrant of left breast in female, estrogen receptor positive ([place]) 04/30/2018   Cervical myelopathy ([place]) 05/17/2017   HNP (herniated nucleus pulposus) with myelopathy, cervical 11/07/2016   Paresthesia and pain of both upper extremities 07/04/2016   Pain in joint, lower leg 07/05/2011   Muscle weakness (generalized) 07/05/2011   Sciatica of right side 06/30/2011   DDD (degenerative disc disease), lumbar 06/30/2011   HYPERTENSION 06/19/2009   GERD 06/19/2009   CONSTIPATION 06/19/2009    Past Surgical History:  Procedure  Laterality Date   ANTERIOR CERVICAL DECOMP/DISCECTOMY FUSION N/A 11/07/2016   Procedure: ANTERIOR CERVICAL DECOMPRESSION/DISCECTOMY FUSION CERVICAL 4- CERVICAL 5;  Surgeon: Jovita Gamma, MD;  Location: [facility];  Service: Neurosurgery;  Laterality: N/A;  ANTERIOR CERVICAL DECOMPRESSION/DISCECTOMY FUSION CERVICAL 4- CERVICAL 5   BREAST LUMPECTOMY Left 05/2018   BREAST LUMPECTOMY WITH RADIOACTIVE SEED AND SENTINEL LYMPH NODE BIOPSY Left 06/06/2018   Procedure: LEFT BREAST LUMPECTOMY WITH RADIOACTIVE SEED AND AXILLARY DEEP SENTINEL LYMPH NODE BIOPSY WITH BLUE DYE INJECTION;  Surgeon: Fanny Skates, MD;  Location: [place];  Service: General;  Laterality: Left;   COLONOSCOPY     EYE SURGERY Bilateral    HARDWARE REMOVAL N/A 11/07/2016   Procedure: anterior cervical disectomy fusion cervical four-five (revision);  Surgeon: Jovita Gamma, MD;  Location: [place];  Service: Neurosurgery;  Laterality: N/A;   VAGINAL HYSTERECTOMY     vision problems since birth       OB History   No obstetric history on file.     Family History  Problem Relation Age of Onset   Arthritis Other    Heart disease Mother    Depression Sister     Social History   Tobacco Use   Smoking status: Never   Smokeless tobacco: Never  Vaping Use   Vaping Use: Never used  Substance Use Topics   Alcohol use: No   Drug use: No    Home Medications Prior to Admission medications   Medication Sig Start Date End Date Taking? Authorizing Provider  amLODipine (NORVASC) 10 MG tablet Take 10 mg by mouth  daily.    Yes [provider]  anastrozole (ARIMIDEX) 1 MG tablet Take 1 tablet (1 mg total) by mouth daily. 02/11/21  Yes Nicholas Lose, MD  aspirin EC 81 MG tablet Take 81 mg by mouth daily.   Yes [provider]  Calcium Carb-Cholecalciferol (CALCIUM + D3 PO) Take 1 tablet by mouth daily.    Yes [provider]  clotrimazole-betamethasone (LOTRISONE) cream Apply 1 application topically 2 (two)  times daily as needed (for irritation).    Yes [provider]  Cyanocobalamin (VITAMIN B-12 PO) Take 1 tablet by mouth daily.   Yes [provider]  DULoxetine (CYMBALTA) 60 MG capsule TAKE ONE CAPSULE BY MOUTH TWICE DAILY. 01/28/21  Yes [name], MD  enalapril (VASOTEC) 20 MG tablet Take 20 mg by mouth daily.    Yes [provider]  famotidine (PEPCID) 20 MG tablet Take 20 mg by mouth 2 (two) times daily. 03/01/21  Yes [provider]  FIBER FORMULA CAPS Take 1 capsule by mouth daily.   Yes [provider]  gabapentin (NEURONTIN) 600 MG tablet TAKE (1) TABLET BY MOUTH (4) TIMES DAILY. Patient taking differently: Take 600 mg by mouth 4 (four) times daily. 01/28/21  Yes [name], MD  hydrochlorothiazide (HYDRODIURIL) 25 MG tablet Take 25 mg by mouth daily.   Yes [provider]  lactulose (CHRONULAC) 10 GM/15ML solution Take 10-20 g by mouth at bedtime as needed for moderate constipation.    Yes [provider]  Multiple Vitamins-Minerals (MULTIVITAMIN WITH MINERALS) tablet Take 1 tablet by mouth daily.   Yes [provider]  Omega 3 1000 MG CAPS Take 1,000 mg by mouth daily.    Yes [provider]  oxyCODONE-acetaminophen (PERCOCET/ROXICET) 5-325 MG tablet Take 1 tablet by mouth every 8 (eight) hours as needed for up to 2 days for severe pain. 03/25/21 03/27/21 Yes Marcello Fennel, PA-C  potassium chloride (K-DUR) 10 MEQ tablet Take 10 mEq by mouth daily. 05/05/17  Yes [provider]  sulfamethoxazole-trimethoprim (BACTRIM DS) 800-160 MG tablet Take 1 tablet by mouth 2 (two) times daily for 3 days. 03/25/21 03/28/21 Yes Marcello Fennel, PA-C  vitamin C (ASCORBIC ACID) 500 MG tablet Take 500 mg by mouth daily.   Yes [provider]  ranitidine (ZANTAC) 150 MG tablet Take 150 mg by mouth daily.  Patient not taking: Reported on 03/25/2021    [provider]    Allergies     Keflex [cephalexin]  Review of Systems   Review of Systems  Constitutional:  Negative for chills and fever.  HENT:  Negative for congestion.   Respiratory:  Negative for shortness of breath.   Cardiovascular:  Negative for chest pain.  Gastrointestinal:  Positive for abdominal pain. Negative for diarrhea, nausea and vomiting.  Genitourinary:  Negative for difficulty urinating, enuresis and flank pain.  Musculoskeletal:  Negative for back pain.  Skin:  Negative for rash.  Neurological:  Negative for dizziness.  Hematological:  Does not bruise/bleed easily.   Physical Exam Updated Vital Signs BP 137/77   Pulse 63   Temp 97.9 F (36.6 C) (Oral)   Resp (!) 22   SpO2 97%   Physical Exam Vitals and nursing note reviewed. Exam conducted with a chaperone present.  Constitutional:      General: She is not in acute distress.    Appearance: She is not ill-appearing.  HENT:     Head: Normocephalic and atraumatic.  Nose: No congestion.  Eyes:     Conjunctiva/sclera: Conjunctivae normal.  Cardiovascular:     Rate and Rhythm: Normal rate and regular rhythm.     Pulses: Normal pulses.     Heart sounds: No murmur heard.   No friction rub. No gallop.  Pulmonary:     Effort: No respiratory distress.     Breath sounds: No wheezing, rhonchi or rales.  Abdominal:     Palpations: Abdomen is soft.     Tenderness: There is abdominal tenderness. There is no right CVA tenderness or left CVA tenderness.     Comments: Abdomen nondistended, normal bowel sounds, dull to percussion, has slight tenderness to palpation in her suprapubic and right lower quadrant, no guarding, rebound tenderness, peritoneal sign, negative Rovsing sign or McBurney point.  Genitourinary:    Comments: With chaperone present pelvic exam was performed, no abnormalities of the external genitalia, there was noted mass coming from the anterior aspect of the vaginal vault, vaginal vault was patent, pink, there is no noted  discharge present.  Cannot fully visualize the cervix.  Patient was nontender on my exam. Musculoskeletal:     Right lower leg: No edema.     Left lower leg: No edema.  Skin:    General: Skin is warm and dry.  Neurological:     Mental Status: She is alert.  Psychiatric:        Mood and Affect: Mood normal.    ED Results / Procedures / Treatments   Labs (all labs ordered are listed, but only abnormal results are displayed) Labs Reviewed  LIPASE, BLOOD - Abnormal; Notable for the following components:      Result Value   Lipase 87 (*)    All other components within normal limits  CBC WITH DIFFERENTIAL/PLATELET - Abnormal; Notable for the following components:   Hemoglobin 11.8 (*)    All other components within normal limits  URINALYSIS, ROUTINE W REFLEX MICROSCOPIC - Abnormal; Notable for the following components:   APPearance HAZY (*)    Leukocytes,Ua MODERATE (*)    All other components within normal limits  URINE CULTURE  COMPREHENSIVE METABOLIC PANEL    EKG None  Radiology CT ABDOMEN PELVIS W CONTRAST  Result Date: 03/25/2021 CLINICAL DATA:  Abdominal pain for 2 days. EXAM: CT ABDOMEN AND PELVIS WITH CONTRAST TECHNIQUE: Multidetector CT imaging of the abdomen and pelvis was performed using the standard protocol following bolus administration of intravenous contrast. CONTRAST:  56mL OMNIPAQUE IOHEXOL 300 MG/ML  SOLN COMPARISON:  None. FINDINGS: Lower chest: No acute airspace disease or pleural effusion. The heart is normal in size Hepatobiliary: No focal hepatic abnormality. Gallbladder physiologically distended, no calcified stone. No biliary dilatation. Pancreas: Unremarkable. No pancreatic ductal dilatation or surrounding inflammatory changes. Spleen: Normal in size without focal abnormality. Adrenals/Urinary Tract: Normal adrenal glands. No hydronephrosis or perinephric edema. Homogeneous renal enhancement with symmetric excretion on delayed phase imaging. No visualized  stone or focal renal lesion. Urinary bladder is physiologically distended without wall thickening. Stomach/Bowel: The stomach is partially distended. No obvious gastric wall thickening. There is no small bowel obstruction or inflammation. Diminutive appendix in the right lower quadrant. No appendicitis small to moderate volume of stool in the proximal colon. Small volume of stool distally. Occasional colonic diverticula involving the descending and sigmoid colon. There is no diverticulitis. No colonic inflammation. Vascular/Lymphatic: Mild aortic atherosclerosis. No aortic aneurysm. Portal vein is patent. No enlarged lymph nodes in the abdomen or pelvis. Reproductive: Hysterectomy.  No adnexal  mass. Other: No free air, free fluid, or intra-abdominal fluid collection. Small fat containing umbilical hernia Musculoskeletal: Lumbar degenerative change. Multilevel vacuum phenomenon. Modic endplate changes at Z6-S0. Mild lumbar scoliotic curvature. There are no acute or suspicious osseous abnormalities. IMPRESSION: 1. No acute abnormality or explanation for abdominal pain. 2. Minimal distal colonic diverticulosis without diverticulitis. Aortic Atherosclerosis (ICD10-I70.0). Electronically Signed   By: Keith Rake M.D.   On: 03/25/2021 18:35    Procedures Procedures   Medications Ordered in ED Medications  iohexol (OMNIPAQUE) 300 MG/ML solution 100 mL (80 mLs Intravenous Contrast Given 03/25/21 1820)    ED Course  I have reviewed the triage vital signs and the nursing notes.  Pertinent labs & imaging results that were available during my care of the patient were reviewed by me and considered in my medical decision making (see chart for details).    MDM Rules/Calculators/A&P                          Initial impression-presents with suprapubic and right lower quadrant pain.  She is alert, does not appear to be in acute stress, vital signs reassuring.  Unclear etiology, will obtain basic lab work-up,  and reassess.  Work-up-CBC shows normocytic anemia hemoglobin 10.8, CMP is unremarkable, lipase is 87, UA shows moderate leukocytes, white blood cells, no bacteria present.  Urine cultures are pending at this time.  CT ab pelvis were negative for acute findings.  Reassessment-patient was updated on lab work and imaging, she states she still some tenderness in her suprapubic region, recommended pelvic exam for further evaluation.  She is agreement this plan.  Exam shows probable anterior vaginal prolapse, UA was also was abnormal, she does not endorse any urinary symptoms but she does have suprapubic pain.  Possible she could also have a UTI, will culture urine, start on antibiotics.  Patient is in agreement this plan.  Rule out-low suspicion for systemic infection as patient nontoxic-appearing, vital signs reassuring, no leukocytosis present within CBC.  I have low suspicion for Pil[place] and/or kidney stone as she has no CVA tenderness, no hematuria present on UA.  I have low suspicion for bowel obstruction, appendicitis, intra-abdominal infection as CT imaging are negative for these findings.   Low suspicion for pancreatitis as lipase within normal limits.  I have low suspicion for ovarian torsion as presentation to a tubal etiology, she had no significant pelvic tenderness during my pelvic exam.  Plan-  Suprapubic pain-unclear etiology likely multifactorial, possible due to anterior vaginal prolapse as well as overlying UTI, will provide her with pain medications, culture urine, started on antibiotics have her follow-up with OB for further evaluation.  Gave her strict return precautions.  Vital signs have remained stable, no indication for hospital admission.  Patient discussed with attending and they agreed with assessment and plan.  Patient given at home care as well strict return precautions.  Patient verbalized that they understood agreed to said plan.  Final Clinical Impression(s) / ED  Diagnoses Final diagnoses:  Acute cystitis without hematuria    Rx / DC Orders ED Discharge Orders          Ordered    sulfamethoxazole-trimethoprim (BACTRIM DS) 800-160 MG tablet  2 times daily        03/25/21 2058    oxyCODONE-acetaminophen (PERCOCET/ROXICET) 5-325 MG tablet  Every 8 hours PRN        03/25/21 2059  Aron Baba 03/25/21 2109    Noemi Chapel, MD 03/26/21 1426

## 2021-03-25 NOTE — Discharge Instructions (Signed)
Exam shows possible vaginal prolapse as well as possible UTI.  I have started you on antibiotics please take as prescribed.  For pain I recommend over-the-counter pain medications if this does not work given you some narcotic medication.I have given you a short course of narcotics please take as prescribed.  This medication can make you drowsy do not consume alcohol or operate heavy machinery when taking this medication.  This medication is Tylenol in it do not take Tylenol and take this medication.    Please follow-up with your PCP as well as OB/GYN for further evaluation.  Come back to the emergency department if you develop chest pain, shortness of breath, severe abdominal pain, uncontrolled nausea, vomiting, diarrhea.

## 2021-03-25 NOTE — ED Triage Notes (Signed)
Pain in lower abdomen when walking for the past 2 days

## 2021-03-26 MED FILL — Oxycodone w/ Acetaminophen Tab 5-325 MG: ORAL | Qty: 6 | Status: AC

## 2021-03-27 LAB — URINE CULTURE

## 2021-03-31 DIAGNOSIS — L84 Corns and callosities: Secondary | ICD-10-CM | POA: Diagnosis not present

## 2021-03-31 DIAGNOSIS — B351 Tinea unguium: Secondary | ICD-10-CM | POA: Diagnosis not present

## 2021-04-02 ENCOUNTER — Other Ambulatory Visit: Payer: Self-pay | Admitting: Adult Health

## 2021-04-02 DIAGNOSIS — Z9889 Other specified postprocedural states: Secondary | ICD-10-CM

## 2021-04-09 DIAGNOSIS — I10 Essential (primary) hypertension: Secondary | ICD-10-CM | POA: Diagnosis not present

## 2021-04-09 DIAGNOSIS — Z23 Encounter for immunization: Secondary | ICD-10-CM | POA: Diagnosis not present

## 2021-04-09 DIAGNOSIS — Z0000 Encounter for general adult medical examination without abnormal findings: Secondary | ICD-10-CM | POA: Diagnosis not present

## 2021-04-09 DIAGNOSIS — Z13220 Encounter for screening for lipoid disorders: Secondary | ICD-10-CM | POA: Diagnosis not present

## 2021-04-09 DIAGNOSIS — N390 Urinary tract infection, site not specified: Secondary | ICD-10-CM | POA: Diagnosis not present

## 2021-05-11 DIAGNOSIS — Z1211 Encounter for screening for malignant neoplasm of colon: Secondary | ICD-10-CM | POA: Diagnosis not present

## 2021-05-18 ENCOUNTER — Ambulatory Visit (INDEPENDENT_AMBULATORY_CARE_PROVIDER_SITE_OTHER): Payer: Medicare Other | Admitting: Neurology

## 2021-05-18 ENCOUNTER — Encounter: Payer: Self-pay | Admitting: Neurology

## 2021-05-18 VITALS — BP 118/73 | HR 77 | Ht 66.0 in | Wt 208.0 lb

## 2021-05-18 DIAGNOSIS — M79601 Pain in right arm: Secondary | ICD-10-CM | POA: Diagnosis not present

## 2021-05-18 DIAGNOSIS — G959 Disease of spinal cord, unspecified: Secondary | ICD-10-CM

## 2021-05-18 DIAGNOSIS — M79602 Pain in left arm: Secondary | ICD-10-CM

## 2021-05-18 DIAGNOSIS — R202 Paresthesia of skin: Secondary | ICD-10-CM | POA: Diagnosis not present

## 2021-05-18 MED ORDER — GABAPENTIN 600 MG PO TABS: 600.0000 mg | ORAL_TABLET | Freq: Four times a day (QID) | ORAL | 5 refills | Status: AC

## 2021-05-18 MED ORDER — DULOXETINE HCL 60 MG PO CPEP: 60.0000 mg | ORAL_CAPSULE | Freq: Two times a day (BID) | ORAL | 5 refills | Status: AC

## 2021-05-18 NOTE — Progress Notes (Signed)
PATIENT: Melissa Rosales DOB: 17-Apr-1946  REASON FOR VISIT: follow up HISTORY FROM: patient Primary Neurologist: Dr. Jannifer Franklin   HISTORY OF PRESENT ILLNESS: Today 05/18/21  Melissa Rosales is a 75 year old female who was last seen in our office in February 2021 for paresthesia in the hands post cervical spine surgery in 2018.  Symptoms are worse in the right than the left.  Overall her symptoms have remained stable.  It does not prevent her from doing anything.  She is blind in the left eye.  She has a family member living with her right now.  She manages her own medications.  The Cymbalta and gabapentin are helpful, but she still does have some discomfort, but overall okay.  Denies dropping things.  She took transportation here.  Update 06/25/2019 SS: Melissa Rosales is a 75 year old female with history of paresthesias that began in the hands in 2017.  She was found to have a cervical myelopathy and underwent surgery in 2018.  She has had residual paresthesias involving the hands.  She is being treated with gabapentin and Cymbalta.  Her gabapentin dose was recently increased to 600 mg 3 times a day.  She has complained of pain in the right leg if she stands too long.  Gabapentin is helpful for paresthesias.  She uses a cane for ambulation.  She has not had any falls.  She reports she has tingling and burning in both hands, right leg since surgery.  The right hand is worse than the left.  She says it does not prevent her from doing anything.  She is interested in increasing the gabapentin, to help with her symptoms.  She presents today for follow-up unaccompanied.  HISTORY 12/17/2018 Dr. Jannifer Franklin: Melissa Rosales is a 75 year old right-handed black female with a history of paresthesias that began in the hands in 2017.  The patient was found to have a cervical myelopathy and underwent surgery in 2018.  The patient has had residual paresthesias involving the hands that have persisted to the present time.  She is  being treated with gabapentin and Cymbalta with some benefit but she continues to have tingling that does not keep her awake at night, she is able to perform all of her activities of daily living in spite of the sensory alterations in the hand.  She does not drop things.  She claims that she walks with a cane and she has not had any falls.  She denies issues controlling the bowels or the bladder.  The patient returns to this office for an evaluation.  Occasionally, she may get transient right flank pain that may come and go.  She does not have sciatica down the leg.  She is blind in the left eye.  She recently has been treated for breast cancer involving the left breast.  REVIEW OF SYSTEMS: Out of a complete 14 system review of symptoms, the patient complains only of the following symptoms, and all other reviewed systems are negative.  See HPI  ALLERGIES: Allergies  Allergen Reactions   Keflex [Cephalexin] Nausea And Vomiting    HOME MEDICATIONS: Outpatient Medications Prior to Visit  Medication Sig Dispense Refill   amLODipine (NORVASC) 10 MG tablet Take 10 mg by mouth daily.      anastrozole (ARIMIDEX) 1 MG tablet Take 1 tablet (1 mg total) by mouth daily. 90 tablet 3   aspirin EC 81 MG tablet Take 81 mg by mouth daily.     Calcium Carb-Cholecalciferol (CALCIUM + D3 PO) Take  1 tablet by mouth daily.      clotrimazole-betamethasone (LOTRISONE) cream Apply 1 application topically 2 (two) times daily as needed (for irritation).      Cyanocobalamin (VITAMIN B-12 PO) Take 1 tablet by mouth daily.     enalapril (VASOTEC) 20 MG tablet Take 20 mg by mouth daily.      famotidine (PEPCID) 20 MG tablet Take 20 mg by mouth 2 (two) times daily.     FIBER FORMULA CAPS Take 1 capsule by mouth daily.     hydrochlorothiazide (HYDRODIURIL) 25 MG tablet Take 25 mg by mouth daily.     lactulose (CHRONULAC) 10 GM/15ML solution Take 10-20 g by mouth at bedtime as needed for moderate constipation.      Multiple  Vitamins-Minerals (MULTIVITAMIN WITH MINERALS) tablet Take 1 tablet by mouth daily.     Omega 3 1000 MG CAPS Take 1,000 mg by mouth daily.      potassium chloride (K-DUR) 10 MEQ tablet Take 10 mEq by mouth daily.     ranitidine (ZANTAC) 150 MG tablet Take 150 mg by mouth daily.     vitamin C (ASCORBIC ACID) 500 MG tablet Take 500 mg by mouth daily.     DULoxetine (CYMBALTA) 60 MG capsule TAKE ONE CAPSULE BY MOUTH TWICE DAILY. 60 capsule 2   gabapentin (NEURONTIN) 600 MG tablet TAKE (1) TABLET BY MOUTH (4) TIMES DAILY. (Patient taking differently: Take 600 mg by mouth 4 (four) times daily.) 120 tablet 2   No facility-administered medications prior to visit.    PAST MEDICAL HISTORY: Past Medical History:  Diagnosis Date   Acid reflux    Arthritis    Cancer ([place])    breast   Cervical myelopathy ([place]) 05/17/2017   C4-5   Hypertension    Personal history of radiation therapy     PAST SURGICAL HISTORY: Past Surgical History:  Procedure Laterality Date   ANTERIOR CERVICAL DECOMP/DISCECTOMY FUSION N/A 11/07/2016   Procedure: ANTERIOR CERVICAL DECOMPRESSION/DISCECTOMY FUSION CERVICAL 4- CERVICAL 5;  Surgeon: Jovita Gamma, MD;  Location: [facility];  Service: Neurosurgery;  Laterality: N/A;  ANTERIOR CERVICAL DECOMPRESSION/DISCECTOMY FUSION CERVICAL 4- CERVICAL 5   BREAST LUMPECTOMY Left 05/2018   BREAST LUMPECTOMY WITH RADIOACTIVE SEED AND SENTINEL LYMPH NODE BIOPSY Left 06/06/2018   Procedure: LEFT BREAST LUMPECTOMY WITH RADIOACTIVE SEED AND AXILLARY DEEP SENTINEL LYMPH NODE BIOPSY WITH BLUE DYE INJECTION;  Surgeon: Fanny Skates, MD;  Location: [facility];  Service: General;  Laterality: Left;   COLONOSCOPY     EYE SURGERY Bilateral    HARDWARE REMOVAL N/A 11/07/2016   Procedure: anterior cervical disectomy fusion cervical four-five (revision);  Surgeon: Jovita Gamma, MD;  Location: [facility];  Service: Neurosurgery;  Laterality: N/A;   VAGINAL HYSTERECTOMY     vision problems since birth       FAMILY HISTORY: Family History  Problem Relation Age of Onset   Arthritis Other    Heart disease Mother    Depression Sister     SOCIAL HISTORY: Social History   Socioeconomic History   Marital status: Legally Separated    Spouse name: Not on file   Number of children: 0   Years of education: 10   Highest education level: Not on file  Occupational History   Occupation: Retired  Tobacco Use   Smoking status: Never   Smokeless tobacco: Never  Vaping Use   Vaping Use: Never used  Substance and Sexual Activity   Alcohol use: No   Drug use: No  Sexual activity: Never  Other Topics Concern   Not on file  Social History Narrative   Lives alone. Niece stays with her.   Caffeine use: Drinks coffee- 1 cup per day   Social Determinants of Health   Financial Resource Strain: Not on file  Food Insecurity: Not on file  Transportation Needs: Not on file  Physical Activity: Not on file  Stress: Not on file  Social Connections: Not on file  Intimate Partner Violence: Not on file   PHYSICAL EXAM  Vitals:   05/18/21 1517  BP: 118/73  Pulse: 77  Weight: 208 lb (94.3 kg)  Height: 5\' 6"  (1.676 m)    Body mass index is 33.57 kg/m.  Generalized: Well developed, in no acute distress  Neurological examination  Mentation: Alert oriented to time, place, history taking. Follows all commands speech and language fluent Cranial nerve II-XII: Extraocular movements were full, visual field were full on the right eye, she is blind in the left eye.  Facial sensation and strength were normal. Head turning and shoulder shrug were normal and symmetric. Motor: Good strength in all extremities Sensory: Sensory testing is intact to soft touch on all 4 extremities. No evidence of extinction is noted.  Coordination: Cerebellar testing reveals good finger-nose-finger and heel-to-shin bilaterally.  Gait and station: Push off from seated position to stand. Gait is wide-based, uses a cane.  Tandem gait was not attempted. Reflexes: Deep tendon reflexes are symmetric but brisk  DIAGNOSTIC DATA (LABS, IMAGING, TESTING) - I reviewed patient records, labs, notes, testing and imaging myself where available.  Lab Results  Component Value Date   WBC 6.3 03/25/2021   HGB 11.8 (L) 03/25/2021   HCT 37.2 03/25/2021   MCV 91.4 03/25/2021   PLT 220 03/25/2021      Component Value Date/Time   NA 137 03/25/2021 1608   K 4.1 03/25/2021 1608   CL 101 03/25/2021 1608   CO2 28 03/25/2021 1608   GLUCOSE 84 03/25/2021 1608   BUN 18 03/25/2021 1608   CREATININE 0.95 03/25/2021 1608   CALCIUM 9.9 03/25/2021 1608   PROT 8.0 03/25/2021 1608   ALBUMIN 4.3 03/25/2021 1608   AST 24 03/25/2021 1608   ALT 22 03/25/2021 1608   ALKPHOS 53 03/25/2021 1608   BILITOT 0.7 03/25/2021 1608   GFRNONAA >60 03/25/2021 1608   GFRAA >60 05/31/2018 1525   No results found for: CHOL, HDL, LDLCALC, LDLDIRECT, TRIG, CHOLHDL No results found for: HGBA1C Lab Results  Component Value Date   VITAMINB12 >2000 (H) 07/04/2016   No results found for: TSH  ASSESSMENT AND PLAN 75 y.o. year old female  has a past medical history of Acid reflux, Arthritis, Cancer ([place]), Cervical myelopathy ([place]) (05/17/2017), Hypertension, and Personal history of radiation therapy. here with:  1.  Cervical myelopathy 2.  Gait disorder 3.  Bilateral hand paresthesia  -Symptoms have remained stable -Continue Cymbalta and gabapentin, refills were sent in for 6 months -Continue follow-up with PCP, can refill meds in future, return here for new or worsening symptoms  Butler Denmark, AGNP-C, DNP 05/18/2021, 4:26 PM [facility] [address] [place], [place] [address] [phone]

## 2021-05-18 NOTE — Patient Instructions (Signed)
Continue taking current medications I will refill for 6 months  Return here as needed basis

## 2021-05-19 ENCOUNTER — Ambulatory Visit
Admission: RE | Admit: 2021-05-19 | Discharge: 2021-05-19 | Disposition: A | Discharge status: Home / Self Care | Payer: Medicare Other | Source: Ambulatory Visit | Attending: Adult Health | Admitting: Adult Health

## 2021-05-19 DIAGNOSIS — Z9889 Other specified postprocedural states: Secondary | ICD-10-CM

## 2021-05-19 DIAGNOSIS — R922 Inconclusive mammogram: Secondary | ICD-10-CM | POA: Diagnosis not present

## 2021-05-19 DIAGNOSIS — Z853 Personal history of malignant neoplasm of breast: Secondary | ICD-10-CM | POA: Diagnosis not present

## 2021-06-02 DIAGNOSIS — H5213 Myopia, bilateral: Secondary | ICD-10-CM | POA: Diagnosis not present

## 2021-06-04 DIAGNOSIS — M1991 Primary osteoarthritis, unspecified site: Secondary | ICD-10-CM | POA: Diagnosis not present

## 2021-06-04 DIAGNOSIS — I10 Essential (primary) hypertension: Secondary | ICD-10-CM | POA: Diagnosis not present

## 2021-06-04 DIAGNOSIS — M545 Low back pain, unspecified: Secondary | ICD-10-CM | POA: Diagnosis not present

## 2021-06-04 DIAGNOSIS — G603 Idiopathic progressive neuropathy: Secondary | ICD-10-CM | POA: Diagnosis not present

## 2021-06-22 DIAGNOSIS — I10 Essential (primary) hypertension: Secondary | ICD-10-CM | POA: Diagnosis not present

## 2021-06-22 DIAGNOSIS — E782 Mixed hyperlipidemia: Secondary | ICD-10-CM | POA: Diagnosis not present

## 2021-07-02 DIAGNOSIS — H5203 Hypermetropia, bilateral: Secondary | ICD-10-CM | POA: Diagnosis not present

## 2021-07-02 DIAGNOSIS — H52223 Regular astigmatism, bilateral: Secondary | ICD-10-CM | POA: Diagnosis not present

## 2021-07-09 ENCOUNTER — Ambulatory Visit (INDEPENDENT_AMBULATORY_CARE_PROVIDER_SITE_OTHER): Payer: Medicare Other | Admitting: Urology

## 2021-07-09 ENCOUNTER — Other Ambulatory Visit: Payer: Self-pay

## 2021-07-09 ENCOUNTER — Encounter: Payer: Self-pay | Admitting: Urology

## 2021-07-09 VITALS — BP 109/69 | HR 69

## 2021-07-09 DIAGNOSIS — N3941 Urge incontinence: Secondary | ICD-10-CM | POA: Diagnosis not present

## 2021-07-09 LAB — URINALYSIS, ROUTINE W REFLEX MICROSCOPIC
Bilirubin, UA: NEGATIVE
Glucose, UA: NEGATIVE
Ketones, UA: NEGATIVE
Nitrite, UA: NEGATIVE
Protein,UA: NEGATIVE
RBC, UA: NEGATIVE
Specific Gravity, UA: 1.025 (ref 1.005–1.030)
Urobilinogen, Ur: 0.2 mg/dL (ref 0.2–1.0)
pH, UA: 6.5 (ref 5.0–7.5)

## 2021-07-09 LAB — BLADDER SCAN AMB NON-IMAGING: Scan Result: 52

## 2021-07-09 LAB — MICROSCOPIC EXAMINATION
RBC, Urine: NONE SEEN /hpf (ref 0–2)
Renal Epithel, UA: NONE SEEN /hpf

## 2021-07-09 MED ORDER — MIRABEGRON ER 25 MG PO TB24: 25.0000 mg | ORAL_TABLET | Freq: Every day | ORAL | 0 refills | Status: DC

## 2021-07-09 NOTE — Progress Notes (Signed)
post void residual=52

## 2021-07-09 NOTE — Progress Notes (Signed)
07/09/2021 11:15 AM   Melissa Rosales Living Feb 07, 1946 [id number]  Referring provider: Jake Samples, PA-C [address] [place],  Melissa Rosales [address]  Urge incontinence   HPI: Ms Melissa Rosales is a 76yo here for evaluation of urge incontinence. She uses 1-2 pads per day which are soaked. She has nocturia 2-3x. PVR 52cc. She had 1 UTI diagnosed in 05/2020. No straining to urinate. Urinary frequency every 1-2 hours. She denies any significant SUI. No hematuria or dysuria. She has numbness/tingling in her fingers and toes. No prior spine surgeries.    PMH: Past Medical History:  Diagnosis Date   Acid reflux    Arthritis    Cancer ([place])    breast   Cervical myelopathy ([place]) 05/17/2017   C4-5   Hypertension    Personal history of radiation therapy     Surgical History: Past Surgical History:  Procedure Laterality Date   ANTERIOR CERVICAL DECOMP/DISCECTOMY FUSION N/A 11/07/2016   Procedure: ANTERIOR CERVICAL DECOMPRESSION/DISCECTOMY FUSION CERVICAL 4- CERVICAL 5;  Surgeon: Jovita Gamma, MD;  Location: [facility];  Service: Neurosurgery;  Laterality: N/A;  ANTERIOR CERVICAL DECOMPRESSION/DISCECTOMY FUSION CERVICAL 4- CERVICAL 5   BREAST LUMPECTOMY Left 05/2018   BREAST LUMPECTOMY WITH RADIOACTIVE SEED AND SENTINEL LYMPH NODE BIOPSY Left 06/06/2018   Procedure: LEFT BREAST LUMPECTOMY WITH RADIOACTIVE SEED AND AXILLARY DEEP SENTINEL LYMPH NODE BIOPSY WITH BLUE DYE INJECTION;  Surgeon: Fanny Skates, MD;  Location: [facility];  Service: General;  Laterality: Left;   COLONOSCOPY     EYE SURGERY Bilateral    HARDWARE REMOVAL N/A 11/07/2016   Procedure: anterior cervical disectomy fusion cervical four-five (revision);  Surgeon: Jovita Gamma, MD;  Location: [facility];  Service: Neurosurgery;  Laterality: N/A;   VAGINAL HYSTERECTOMY     vision problems since birth      Home Medications:  Allergies as of 07/09/2021       Reactions   Keflex [cephalexin] Nausea And Vomiting         Medication List        Accurate as of July 09, 2021 11:15 AM. If you have any questions, ask your nurse or doctor.          amLODipine 10 MG tablet Commonly known as: NORVASC Take 10 mg by mouth daily.   anastrozole 1 MG tablet Commonly known as: ARIMIDEX Take 1 tablet (1 mg total) by mouth daily.   aspirin EC 81 MG tablet Take 81 mg by mouth daily.   CALCIUM + D3 PO Take 1 tablet by mouth daily.   clotrimazole-betamethasone cream Commonly known as: LOTRISONE Apply 1 application topically 2 (two) times daily as needed (for irritation).   DULoxetine 60 MG capsule Commonly known as: CYMBALTA Take 1 capsule (60 mg total) by mouth 2 (two) times daily.   enalapril 20 MG tablet Commonly known as: VASOTEC Take 20 mg by mouth daily.   famotidine 20 MG tablet Commonly known as: PEPCID Take 20 mg by mouth 2 (two) times daily.   Fiber Formula Caps Take 1 capsule by mouth daily.   gabapentin 600 MG tablet Commonly known as: NEURONTIN Take 1 tablet (600 mg total) by mouth 4 (four) times daily. Take 1 tablet by mouth 4 times daily   hydrochlorothiazide 25 MG tablet Commonly known as: HYDRODIURIL Take 25 mg by mouth daily.   lactulose 10 GM/15ML solution Commonly known as: CHRONULAC Take 10-20 g by mouth at bedtime as needed for moderate constipation.   meloxicam 15 MG tablet Commonly known as: MOBIC  Take 15 mg by mouth every morning.   multivitamin with minerals tablet Take 1 tablet by mouth daily.   Omega 3 1000 MG Caps Take 1,000 mg by mouth daily.   potassium chloride 10 MEQ tablet Commonly known as: KLOR-CON Take 10 mEq by mouth daily.   ranitidine 150 MG tablet Commonly known as: ZANTAC Take 150 mg by mouth daily.   VITAMIN B-12 PO Take 1 tablet by mouth daily.   vitamin C 500 MG tablet Commonly known as: ASCORBIC ACID Take 500 mg by mouth daily.        Allergies:  Allergies  Allergen Reactions   Keflex [Cephalexin] Nausea And  Vomiting    Family History: Family History  Problem Relation Age of Onset   Arthritis Other    Heart disease Mother    Depression Sister     Social History:  reports that she has never smoked. She has never used smokeless tobacco. She reports that she does not drink alcohol and does not use drugs.  ROS: All other review of systems were reviewed and are negative except what is noted above in HPI  Physical Exam: BP 109/69    Pulse 69   Constitutional:  Alert and oriented, No acute distress. HEENT: [place] AT, moist mucus membranes.  Trachea midline, no masses. Cardiovascular: No clubbing, cyanosis, or edema. Respiratory: Normal respiratory effort, no increased work of breathing. GI: Abdomen is soft, nontender, nondistended, no abdominal masses GU: No CVA tenderness.  Lymph: No cervical or inguinal lymphadenopathy. Skin: No rashes, bruises or suspicious lesions. Neurologic: Grossly intact, no focal deficits, moving all 4 extremities. Psychiatric: Normal mood and affect.  Laboratory Data: Lab Results  Component Value Date   WBC 6.3 03/25/2021   HGB 11.8 (L) 03/25/2021   HCT 37.2 03/25/2021   MCV 91.4 03/25/2021   PLT 220 03/25/2021    Lab Results  Component Value Date   CREATININE 0.95 03/25/2021    No results found for: PSA  No results found for: TESTOSTERONE  No results found for: HGBA1C  Urinalysis    Component Value Date/Time   COLORURINE YELLOW 03/25/2021 1940   APPEARANCEUR HAZY (A) 03/25/2021 1940   LABSPEC 1.017 03/25/2021 1940   PHURINE 7.0 03/25/2021 1940   GLUCOSEU NEGATIVE 03/25/2021 1940   HGBUR NEGATIVE 03/25/2021 1940   BILIRUBINUR NEGATIVE 03/25/2021 1940   KETONESUR NEGATIVE 03/25/2021 1940   PROTEINUR NEGATIVE 03/25/2021 1940   NITRITE NEGATIVE 03/25/2021 1940   LEUKOCYTESUR MODERATE (A) 03/25/2021 1940    Lab Results  Component Value Date   BACTERIA NONE SEEN 03/25/2021    Pertinent Imaging:  No results found for this or any previous  visit.  No results found for this or any previous visit.  No results found for this or any previous visit.  No results found for this or any previous visit.  No results found for this or any previous visit.  No results found for this or any previous visit.  No results found for this or any previous visit.  No results found for this or any previous visit.   Assessment & Plan:    1. Urge incontinence -We w - Urinalysis, Routine w reflex microscopic - BLADDER SCAN AMB NON-IMAGING   No follow-ups on file.  Nicolette Bang, MD  [facility] [place]

## 2021-07-09 NOTE — Patient Instructions (Signed)

## 2021-07-19 DIAGNOSIS — L84 Corns and callosities: Secondary | ICD-10-CM | POA: Diagnosis not present

## 2021-07-19 DIAGNOSIS — B351 Tinea unguium: Secondary | ICD-10-CM | POA: Diagnosis not present

## 2021-08-04 ENCOUNTER — Other Ambulatory Visit: Payer: Self-pay

## 2021-08-04 ENCOUNTER — Ambulatory Visit (INDEPENDENT_AMBULATORY_CARE_PROVIDER_SITE_OTHER): Payer: Medicare Other | Admitting: Physician Assistant

## 2021-08-04 VITALS — BP 98/62 | HR 69

## 2021-08-04 DIAGNOSIS — N3941 Urge incontinence: Secondary | ICD-10-CM | POA: Diagnosis not present

## 2021-08-04 LAB — MICROSCOPIC EXAMINATION
Bacteria, UA: NONE SEEN
RBC, Urine: NONE SEEN /hpf (ref 0–2)
Renal Epithel, UA: NONE SEEN /hpf

## 2021-08-04 LAB — URINALYSIS, ROUTINE W REFLEX MICROSCOPIC
Bilirubin, UA: NEGATIVE
Glucose, UA: NEGATIVE
Nitrite, UA: NEGATIVE
Protein,UA: NEGATIVE
RBC, UA: NEGATIVE
Specific Gravity, UA: 1.025 (ref 1.005–1.030)
Urobilinogen, Ur: 0.2 mg/dL (ref 0.2–1.0)
pH, UA: 6 (ref 5.0–7.5)

## 2021-08-04 MED ORDER — MIRABEGRON ER 25 MG PO TB24: 25.0000 mg | ORAL_TABLET | Freq: Every day | ORAL | 11 refills | Status: DC

## 2021-08-04 NOTE — Progress Notes (Signed)
post void residual =0mL 

## 2021-08-04 NOTE — Progress Notes (Signed)
? ?Assessment: ?1. Urge incontinence   ? ? ?Plan: ?Continue current prescription of Myrbetriq 25 mg daily.  She will follow-up in 6 months for urinalysis and PVR with further evaluation, sooner if she has concerns for urinary issues. ? ?Chief Complaint: ?No chief complaint on file. ? ? ?HPI: ? ?08/04/21 ?Melissa Rosales is a 76 y.o. female who presents for continued evaluation of urge incontinence.  Patient was placed on Myrbetriq 25 mg daily at her visit 1 month ago and states she is doing well on the 25 mg dose.  No UTI symptoms today and she feels that she is emptying completely.  No gross hematuria. ?UA clear ?PVR=0 ? ?07/09/21 ?Melissa Rosales is a 76yo here for evaluation of urge incontinence. She uses 1-2 pads per day which are soaked. She has nocturia 2-3x. PVR 52cc. She had 1 UTI diagnosed in 05/2020. No straining to urinate. Urinary frequency every 1-2 hours. She denies any significant SUI. No hematuria or dysuria. She has numbness/tingling in her fingers and toes. No prior spine surgeries.  ? ?Portions of the above documentation were copied from a prior visit for review purposes only. ? ?Allergies: ?Allergies  ?Allergen Reactions  ? Keflex [Cephalexin] Nausea And Vomiting  ? ? ?PMH: ?Past Medical History:  ?Diagnosis Date  ? Acid reflux   ? Arthritis   ? Cancer [facility])   ? breast  ? Cervical myelopathy ([place]) 05/17/2017  ? C4-5  ? Hypertension   ? Personal history of radiation therapy   ? ? ?PSH: ?Past Surgical History:  ?Procedure Laterality Date  ? ANTERIOR CERVICAL DECOMP/DISCECTOMY FUSION N/A 11/07/2016  ? Procedure: ANTERIOR CERVICAL DECOMPRESSION/DISCECTOMY FUSION CERVICAL 4- CERVICAL 5;  Surgeon: Jovita Gamma, MD;  Location: [facility];  Service: Neurosurgery;  Laterality: N/A;  ANTERIOR CERVICAL DECOMPRESSION/DISCECTOMY FUSION CERVICAL 4- CERVICAL 5  ? BREAST LUMPECTOMY Left 05/2018  ? BREAST LUMPECTOMY WITH RADIOACTIVE SEED AND SENTINEL LYMPH NODE BIOPSY Left 06/06/2018  ? Procedure: LEFT BREAST LUMPECTOMY  WITH RADIOACTIVE SEED AND AXILLARY DEEP SENTINEL LYMPH NODE BIOPSY WITH BLUE DYE INJECTION;  Surgeon: Fanny Skates, MD;  Location: [facility];  Service: General;  Laterality: Left;  ? COLONOSCOPY    ? EYE SURGERY Bilateral   ? HARDWARE REMOVAL N/A 11/07/2016  ? Procedure: anterior cervical disectomy fusion cervical four-five (revision);  Surgeon: Jovita Gamma, MD;  Location: [facility];  Service: Neurosurgery;  Laterality: N/A;  ? VAGINAL HYSTERECTOMY    ? vision problems since birth    ? ? ?SH: ?Social History  ? ?Tobacco Use  ? Smoking status: Never  ? Smokeless tobacco: Never  ?Vaping Use  ? Vaping Use: Never used  ?Substance Use Topics  ? Alcohol use: No  ? Drug use: No  ? ? ?ROS: ?Constitutional:  Negative for fever, chills, weight loss ?CV: Negative for chest pain, previous MI, hypertension ?Respiratory:  Negative for shortness of breath, wheezing, sleep apnea, frequent cough ?GI:  Negative for nausea, vomiting, bloody stool, GERD ? ?PE: ?BP 98/62   Pulse 69  ?GENERAL APPEARANCE:  Well appearing, well developed, well nourished, NAD ?HEENT:  Atraumatic, normocephalic ?NECK:  Supple. Trachea midline ?ABDOMEN:  Soft, non-tender, no masses ?EXTREMITIES:  Moves all extremities well, without clubbing, cyanosis, or edema ?NEUROLOGIC:  Alert and oriented x 3, normal gait, CN II-XII grossly intact ?MENTAL STATUS:  appropriate ?BACK:  Non-tender to palpation, No CVAT ?SKIN:  Warm, dry, and intact ? ? ?Results: ?Laboratory Data: ?Lab Results  ?Component Value Date  ? WBC 6.3 03/25/2021  ?  HGB 11.8 (L) 03/25/2021  ? HCT 37.2 03/25/2021  ? MCV 91.4 03/25/2021  ? PLT 220 03/25/2021  ? ? ?Lab Results  ?Component Value Date  ? CREATININE 0.95 03/25/2021  ? ? ?Urinalysis ?   ?Component Value Date/Time  ? [place] YELLOW 03/25/2021 1940  ? APPEARANCEUR Clear 07/09/2021 1135  ? LABSPEC 1.017 03/25/2021 1940  ? PHURINE 7.0 03/25/2021 1940  ? GLUCOSEU Negative 07/09/2021 1135  ? [place] NEGATIVE 03/25/2021 1940  ? BILIRUBINUR  Negative 07/09/2021 1135  ? [place] NEGATIVE 03/25/2021 1940  ? PROTEINUR Negative 07/09/2021 1135  ? [place] NEGATIVE 03/25/2021 1940  ? NITRITE Negative 07/09/2021 1135  ? NITRITE NEGATIVE 03/25/2021 1940  ? LEUKOCYTESUR 1+ (A) 07/09/2021 1135  ? LEUKOCYTESUR MODERATE (A) 03/25/2021 1940  ? ? ?Lab Results  ?Component Value Date  ? LABMICR See below: 07/09/2021  ? WBCUA 6-10 (A) 07/09/2021  ? LABEPIT 0-10 07/09/2021  ? MUCUS Present 07/09/2021  ? BACTERIA Few 07/09/2021  ? ? ?Pertinent Imaging: ? ? ?No results found for this or any previous visit (from the past 24 hour(s)).  ?

## 2021-09-29 DIAGNOSIS — L84 Corns and callosities: Secondary | ICD-10-CM | POA: Diagnosis not present

## 2021-09-29 DIAGNOSIS — B351 Tinea unguium: Secondary | ICD-10-CM | POA: Diagnosis not present

## 2021-11-25 ENCOUNTER — Other Ambulatory Visit: Payer: Self-pay | Admitting: Neurology

## 2021-12-14 DIAGNOSIS — B351 Tinea unguium: Secondary | ICD-10-CM | POA: Diagnosis not present

## 2021-12-14 DIAGNOSIS — L84 Corns and callosities: Secondary | ICD-10-CM | POA: Diagnosis not present

## 2021-12-17 DIAGNOSIS — M25551 Pain in right hip: Secondary | ICD-10-CM | POA: Diagnosis not present

## 2021-12-17 DIAGNOSIS — J209 Acute bronchitis, unspecified: Secondary | ICD-10-CM | POA: Diagnosis not present

## 2022-02-07 ENCOUNTER — Ambulatory Visit (INDEPENDENT_AMBULATORY_CARE_PROVIDER_SITE_OTHER): Payer: Medicare Other | Admitting: Physician Assistant

## 2022-02-07 VITALS — BP 133/75 | HR 68

## 2022-02-07 DIAGNOSIS — N3941 Urge incontinence: Secondary | ICD-10-CM | POA: Diagnosis not present

## 2022-02-07 LAB — URINALYSIS, ROUTINE W REFLEX MICROSCOPIC
Bilirubin, UA: NEGATIVE
Glucose, UA: NEGATIVE
Ketones, UA: NEGATIVE
Nitrite, UA: NEGATIVE
RBC, UA: NEGATIVE
Specific Gravity, UA: 1.02 (ref 1.005–1.030)
Urobilinogen, Ur: 0.2 mg/dL (ref 0.2–1.0)
pH, UA: 5.5 (ref 5.0–7.5)

## 2022-02-07 LAB — MICROSCOPIC EXAMINATION: Bacteria, UA: NONE SEEN

## 2022-02-07 LAB — BLADDER SCAN AMB NON-IMAGING: Scan Result: 0

## 2022-02-07 NOTE — Progress Notes (Signed)
post void residual=0 ?

## 2022-02-07 NOTE — Progress Notes (Signed)
Assessment: 1. Urge incontinence - BLADDER SCAN AMB NON-IMAGING - Urinalysis, Routine w reflex microscopic    Plan:` She will continue on Myrbetriq and samples given.  Follow-up with Dr. Alyson Ingles in 1 year for UA and PVR.  If she has concerns sooner, she will call.  Patient was escorted to check out personally by this provider and patient noted to ambulate with cane slowly, but without difficulty.  She denied symptoms of pain or injury from her incident which caused her to sit in the floor.  Chief Complaint: No chief complaint on file.   HPI: Melissa Rosales is a 76 y.o. female who presents for continued evaluation of UUI. She states she is doing well and staying mostly dry. Only wears pads during the day now. Happy on Myrbetriq 25. No UTIs since last visit. Denies significant SUI, no hematuria, burning, dysuria. Significant LE edema ongoing. Some urgency at night. Pt states ambulation becoming more difficult. She remains on cane, but has walker at home. Advised walker is safer  UA= clear PVR=74m  Per SDarcella Gasman MA, the pt apparently attempted to sit on rolling stool when entering the exam room. This provider entered exam room and pt was sitting up on the floor in NAD. Pt denied pain or injury related to the fall. Clinical staff able to lift pt to chair without difficulty. Safety incident reported by AGibson RampRN.  08/04/21 Cicely Melissa YZAGUIRREis a 76y.o. female who presents for continued evaluation of urge incontinence.  Patient was placed on Myrbetriq 25 mg daily at her visit 1 month ago and states she is doing well on the 25 mg dose.  No UTI symptoms today and she feels that she is emptying completely.  No gross hematuria. UA clear PVR=0  07/09/21 Ms SLetendreis a 89yohere for evaluation of urge incontinence. She uses 1-2 pads per day which are soaked. She has nocturia 2-3x. PVR 52cc. She had 1 UTI diagnosed in 05/2020. No straining to urinate. Urinary frequency every 1-2 hours.  She denies any significant SUI. No hematuria or dysuria. She has numbness/tingling in her fingers and toes. No prior spine surgeries.     Portions of the above documentation were copied from a prior visit for review purposes only.  Allergies: Allergies  Allergen Reactions   Keflex [Cephalexin] Nausea And Vomiting    PMH: Past Medical History:  Diagnosis Date   Acid reflux    Arthritis    Cancer (HCC)    breast   [place]   C4-5   Hypertension    Personal history of radiation therapy     PSH: Past Surgical History:  Procedure Laterality Date   ANTERIOR CERVICAL DECOMP/DISCECTOMY FUSION N/A 11/07/2016   Procedure: ANTERIOR CERVICAL DECOMPRESSION/DISCECTOMY FUSION CERVICAL 4- CERVICAL 5;  Surgeon: NJovita Gamma MD;  [facility]  Service: Neurosurgery;  Laterality: N/A;  ANTERIOR CERVICAL DECOMPRESSION/DISCECTOMY FUSION CERVICAL 4- CERVICAL 5   BREAST LUMPECTOMY Left 05/2018   BREAST LUMPECTOMY WITH RADIOACTIVE SEED AND SENTINEL LYMPH NODE BIOPSY Left 06/06/2018   Procedure: LEFT BREAST LUMPECTOMY WITH RADIOACTIVE SEED AND AXILLARY DEEP SENTINEL LYMPH NODE BIOPSY WITH BLUE DYE INJECTION;  Surgeon: IFanny Skates MD;  [facility]  Service: General;  Laterality: Left;   COLONOSCOPY     EYE SURGERY Bilateral    HARDWARE REMOVAL N/A 11/07/2016   Procedure: anterior cervical disectomy fusion cervical four-five (revision);  Surgeon: NJovita Gamma MD;  [place] [facility]  Service: Neurosurgery;  Laterality:  N/A;   VAGINAL HYSTERECTOMY     vision problems since birth      SH: Social History   Tobacco Use   Smoking status: Never   Smokeless tobacco: Never  Vaping Use   Vaping Use: Never used  Substance Use Topics   Alcohol use: No   Drug use: No    ROS: All other review of systems were reviewed and are negative except what is noted above in HPI  PE: There were no vitals taken for this visit. GENERAL APPEARANCE:  Well appearing,  well developed, well nourished, NAD HEENT:  Atraumatic, normocephalic NECK:  Supple. Trachea midline ABDOMEN:  Soft, non-tender, no masses EXTREMITIES:  Moves all extremities well, without clubbing, cyanosis, or edema NEUROLOGIC:  Alert and oriented x 3, ambulating with cane.  MENTAL STATUS:  appropriate BACK:  Non-tender to palpation, No CVAT SKIN:  Warm, dry, and intact   Results: Laboratory Data: Lab Results  Component Value Date   WBC 6.3 03/25/2021   HGB 11.8 (L) 03/25/2021   HCT 37.2 03/25/2021   MCV 91.4 03/25/2021   PLT 220 03/25/2021    Lab Results  Component Value Date   CREATININE 0.95 03/25/2021    No results found for: "PSA"  No results found for: "TESTOSTERONE"  No results found for: "HGBA1C"  Urinalysis    Component Value Date/Time   COLORURINE YELLOW 03/25/2021 1940   APPEARANCEUR Clear 08/04/2021 1520   LABSPEC 1.017 03/25/2021 1940   PHURINE 7.0 03/25/2021 1940   GLUCOSEU Negative 08/04/2021 [place] 03/25/2021 1940   BILIRUBINUR Negative 08/04/2021 [place] 03/25/2021 1940   PROTEINUR Negative 08/04/2021 [place] 03/25/2021 1940   NITRITE Negative 08/04/2021 1520   NITRITE NEGATIVE 03/25/2021 1940   LEUKOCYTESUR 1+ (A) 08/04/2021 1520   LEUKOCYTESUR MODERATE (A) 03/25/2021 1940    Lab Results  Component Value Date   LABMICR See below: 08/04/2021   WBCUA 6-10 (A) 08/04/2021   LABEPIT 0-10 08/04/2021   MUCUS Present 07/09/2021   BACTERIA None seen 08/04/2021    Pertinent Imaging: No results found for this or any previous visit.  No results found for this or any previous visit.  No results found for this or any previous visit.  No results found for this or any previous visit.  No results found for this or any previous visit.  No results found for this or any previous visit.  No results found for this or any previous visit.  No results found for this or any previous visit.  No  results found for this or any previous visit (from the past 24 hour(s)).

## 2022-02-24 ENCOUNTER — Other Ambulatory Visit: Payer: Self-pay | Admitting: Hematology and Oncology

## 2022-03-20 NOTE — Progress Notes (Incomplete)
Patient Care Team: Redmond School, MD as PCP - General (Internal Medicine) Iran Planas, MD as Consulting Physician (Orthopedic Surgery) Mauro Kaufmann, RN as Oncology Nurse Navigator Rockwell Germany, RN as Oncology Nurse Navigator Fanny Skates, MD as Consulting Physician (General Surgery) Nicholas Lose, MD as Consulting Physician (Hematology and Oncology) Meda Klinefelter, MD as Referring Physician (Radiation Oncology)  DIAGNOSIS: No diagnosis found.  SUMMARY OF ONCOLOGIC HISTORY: Oncology History  Malignant neoplasm of upper-outer quadrant of left breast in female, estrogen receptor positive ([place])  04/03/2018 Initial Diagnosis   Screening detected left breast mass 1.9 cm UOQ, ultrasound revealed 1.4 x 0.7 x 1.2 cm mass 2 o'clock position 8 cm from the nipple, 2 axillary lymph nodes with thickened cortices measuring 6 mm, Biopsy of the left breast mass IDC with mucinous features grade 1-2 with DCIS intermediate grade, lymph node benign, ER 90%, PR 0%, Ki-67 10%, HER-2 equivocal by IHC FISH negative ratio 1.13 copy #1.7, T1c N0 stage Ia   04/30/2018 Cancer Staging   Staging form: Breast, AJCC 8th Edition - Clinical stage from 04/30/2018: Stage IA (cT1c, cN0, cM0, G2, ER+, PR-, HER2-) - Signed by Nicholas Lose, MD on 04/30/2018   06/06/2018 Surgery   Left lumpectomy: Mucinous adenocarcinoma grade 1, 1.8 cm, DCIS, intermediate grade, negative for LV I or PNI, margins negative, DCIS focal less than 1 mm anterior margin, 0/4 lymph nodes negative, ER 90%, PR 0%, HER-2 negative, Ki-67 10%, T1CN0 stage Ia   06/14/2018 Cancer Staging   Staging form: Breast, AJCC 8th Edition - Pathologic stage from 06/14/2018: Stage IA (pT1c, pN0(sn), cM0, G1, ER+, PR-, HER2-) - Signed by Nicholas Lose, MD on 06/14/2018   07/23/2018 - 08/16/2018 Radiation Therapy   Adjuvant radiation at [facility] with Dr. Francesca Jewett: 42.56 Gy in 16 treatments   09/2018 -  Anti-estrogen oral therapy   Anastrozole daily      CHIEF COMPLIANT: Follow-up of left breast cancer on anastrozole     INTERVAL HISTORY: Melissa Rosales is a  76 y.o. with above-mentioned history of left breast cancer treated with lumpectomy, radiation, and who is currently on antiestrogen therapy with anastrozole. She presents to the clinic for a follow-up.    ALLERGIES:  is allergic to keflex [cephalexin].  MEDICATIONS:  Current Outpatient Medications  Medication Sig Dispense Refill   amLODipine (NORVASC) 10 MG tablet Take 10 mg by mouth daily.      anastrozole (ARIMIDEX) 1 MG tablet TAKE ONE TABLET BY MOUTH DAILY 30 tablet 0   aspirin EC 81 MG tablet Take 81 mg by mouth daily.     Calcium Carb-Cholecalciferol (CALCIUM + D3 PO) Take 1 tablet by mouth daily.      clotrimazole-betamethasone (LOTRISONE) cream Apply 1 application topically 2 (two) times daily as needed (for irritation).      Cyanocobalamin (VITAMIN B-12 PO) Take 1 tablet by mouth daily.     DULoxetine (CYMBALTA) 60 MG capsule Take 1 capsule (60 mg total) by mouth 2 (two) times daily. 60 capsule 5   enalapril (VASOTEC) 20 MG tablet Take 20 mg by mouth daily.      famotidine (PEPCID) 20 MG tablet Take 20 mg by mouth 2 (two) times daily.     FIBER FORMULA CAPS Take 1 capsule by mouth daily.     gabapentin (NEURONTIN) 600 MG tablet Take 1 tablet (600 mg total) by mouth 4 (four) times daily. Take 1 tablet by mouth 4 times daily 120 tablet 5   hydrochlorothiazide (HYDRODIURIL) 25 MG  tablet Take 25 mg by mouth daily.     lactulose (CHRONULAC) 10 GM/15ML solution Take 10-20 g by mouth at bedtime as needed for moderate constipation.      meloxicam (MOBIC) 15 MG tablet Take 15 mg by mouth every morning.     mirabegron ER (MYRBETRIQ) 25 MG TB24 tablet Take 1 tablet (25 mg total) by mouth daily. 30 tablet 11   Multiple Vitamins-Minerals (MULTIVITAMIN WITH MINERALS) tablet Take 1 tablet by mouth daily.     Omega 3 1000 MG CAPS Take 1,000 mg by mouth daily.      potassium chloride  (K-DUR) 10 MEQ tablet Take 10 mEq by mouth daily.     ranitidine (ZANTAC) 150 MG tablet Take 150 mg by mouth daily.     vitamin C (ASCORBIC ACID) 500 MG tablet Take 500 mg by mouth daily.     No current facility-administered medications for this visit.    PHYSICAL EXAMINATION: ECOG PERFORMANCE STATUS: {CHL ONC ECOG PS:[id number]}  There were no vitals filed for this visit. There were no vitals filed for this visit.  BREAST:*** No palpable masses or nodules in either right or left breasts. No palpable axillary supraclavicular or infraclavicular adenopathy no breast tenderness or nipple discharge. (exam performed in the presence of a chaperone)  LABORATORY DATA:  I have reviewed the data as listed    Latest Ref Rng & Units 03/25/2021    4:08 PM 05/31/2018    3:25 PM 11/07/2016    6:29 AM  CMP  Glucose 70 - 99 mg/dL 84  89  91   BUN 8 - 23 mg/dL _0 Creatinine 0.44 - 1.00 mg/dL 0.95  1.03  0.99   Sodium 135 - 145 mmol/L 137  136  141   Potassium 3.5 - 5.1 mmol/L 4.1  3.7  3.3   Chloride 98 - 111 mmol/L 101  101  105   CO2 22 - 32 mmol/L _1 Calcium 8.9 - 10.3 mg/dL 9.9  9.7  9.5   Total Protein 6.5 - 8.1 g/dL 8.0  8.0    Total Bilirubin 0.3 - 1.2 mg/dL 0.7  0.9    Alkaline Phos 38 - 126 U/L 53  46    AST 15 - 41 U/L 24  23    ALT 0 - 44 U/L 22  19      Lab Results  Component Value Date   WBC 6.3 03/25/2021   HGB 11.8 (L) 03/25/2021   HCT 37.2 03/25/2021   MCV 91.4 03/25/2021   PLT 220 03/25/2021   NEUTROABS 4.0 03/25/2021    ASSESSMENT & PLAN:  No problem-specific Assessment & Plan notes found for this encounter.    No orders of the defined types were placed in this encounter.  The patient has a good understanding of the overall plan. she agrees with it. she will call with any problems that may develop before the next visit here. Total time spent: 30 mins including face to face time and time spent for planning, charting and co-ordination of care    Suzzette Righter, [place] 03/20/22    I Gardiner Coins am scribing for Dr. Lindi Adie  ***

## 2022-03-25 ENCOUNTER — Other Ambulatory Visit: Payer: Self-pay | Admitting: Hematology and Oncology

## 2022-03-25 ENCOUNTER — Inpatient Hospital Stay: Payer: Medicare Other | Attending: Hematology and Oncology | Admitting: Hematology and Oncology

## 2022-03-25 NOTE — Assessment & Plan Note (Deleted)
06/06/2018:Left lumpectomy: Mucinous adenocarcinoma grade 1, 1.8 cm, DCIS, intermediate grade, negative for LV I or PNI, margins negative, DCIS focal less than 1 mm anterior margin, 0/4 lymph nodes negative, ER 90%, PR 0%, HER-2 negative, Ki-67 10%, T1CN0 stage Ia   Adjuvant radiation therapy at [facility]   Current treatment: Adjuvant antiestrogen therapy with anastrozole 1 mg daily x5 to 7 years started May 2020 Anastrozole toxicities: Tolerating anastrozole extremely well. Occasional hot flashes and occasional muscle aches and pains.   Breast cancer surveillance: Breast exam 03/25/2022: Benign 2. mammogram 05/19/2021: Benign breast density category B   Legally blind. Return to clinic in 1 year for follow-up

## 2022-04-12 DIAGNOSIS — L84 Corns and callosities: Secondary | ICD-10-CM | POA: Diagnosis not present

## 2022-04-12 DIAGNOSIS — B351 Tinea unguium: Secondary | ICD-10-CM | POA: Diagnosis not present

## 2022-04-26 ENCOUNTER — Other Ambulatory Visit: Payer: Self-pay | Admitting: Hematology and Oncology

## 2022-04-29 ENCOUNTER — Other Ambulatory Visit: Payer: Self-pay | Admitting: Adult Health

## 2022-04-29 DIAGNOSIS — Z9889 Other specified postprocedural states: Secondary | ICD-10-CM

## 2022-05-03 ENCOUNTER — Telehealth: Payer: Self-pay | Admitting: Hematology and Oncology

## 2022-05-03 NOTE — Telephone Encounter (Signed)
Scheduled appointment per 12/5 staff message. Patient is aware.

## 2022-05-26 ENCOUNTER — Other Ambulatory Visit: Payer: Self-pay | Admitting: Hematology and Oncology

## 2022-06-03 DIAGNOSIS — I10 Essential (primary) hypertension: Secondary | ICD-10-CM | POA: Diagnosis not present

## 2022-06-03 DIAGNOSIS — Z13220 Encounter for screening for lipoid disorders: Secondary | ICD-10-CM | POA: Diagnosis not present

## 2022-06-03 DIAGNOSIS — Z0001 Encounter for general adult medical examination with abnormal findings: Secondary | ICD-10-CM | POA: Diagnosis not present

## 2022-06-03 DIAGNOSIS — E782 Mixed hyperlipidemia: Secondary | ICD-10-CM | POA: Diagnosis not present

## 2022-06-03 DIAGNOSIS — M179 Osteoarthritis of knee, unspecified: Secondary | ICD-10-CM | POA: Diagnosis not present

## 2022-06-03 DIAGNOSIS — G603 Idiopathic progressive neuropathy: Secondary | ICD-10-CM | POA: Diagnosis not present

## 2022-06-03 DIAGNOSIS — K219 Gastro-esophageal reflux disease without esophagitis: Secondary | ICD-10-CM | POA: Diagnosis not present

## 2022-06-13 DIAGNOSIS — L84 Corns and callosities: Secondary | ICD-10-CM | POA: Diagnosis not present

## 2022-06-13 DIAGNOSIS — B351 Tinea unguium: Secondary | ICD-10-CM | POA: Diagnosis not present

## 2022-06-22 ENCOUNTER — Other Ambulatory Visit: Payer: Self-pay | Admitting: Hematology and Oncology

## 2022-06-24 DIAGNOSIS — H905 Unspecified sensorineural hearing loss: Secondary | ICD-10-CM | POA: Diagnosis not present

## 2022-06-27 ENCOUNTER — Ambulatory Visit
Admission: RE | Admit: 2022-06-27 | Discharge: 2022-06-27 | Disposition: A | Discharge status: Home / Self Care | Payer: 59 | Source: Ambulatory Visit | Attending: Adult Health | Admitting: Adult Health

## 2022-06-27 DIAGNOSIS — R928 Other abnormal and inconclusive findings on diagnostic imaging of breast: Secondary | ICD-10-CM | POA: Diagnosis not present

## 2022-06-27 DIAGNOSIS — Z9889 Other specified postprocedural states: Secondary | ICD-10-CM

## 2022-06-27 DIAGNOSIS — Z853 Personal history of malignant neoplasm of breast: Secondary | ICD-10-CM | POA: Diagnosis not present

## 2022-06-28 ENCOUNTER — Inpatient Hospital Stay: Payer: 59 | Attending: Hematology and Oncology | Admitting: Hematology and Oncology

## 2022-06-28 ENCOUNTER — Ambulatory Visit: Payer: Medicare Other | Admitting: Hematology and Oncology

## 2022-06-28 DIAGNOSIS — Z1211 Encounter for screening for malignant neoplasm of colon: Secondary | ICD-10-CM | POA: Diagnosis not present

## 2022-06-28 DIAGNOSIS — Z1212 Encounter for screening for malignant neoplasm of rectum: Secondary | ICD-10-CM | POA: Diagnosis not present

## 2022-06-28 NOTE — Progress Notes (Incomplete)
Patient Care Team: Redmond School, MD as PCP - General (Internal Medicine) Iran Planas, MD as Consulting Physician (Orthopedic Surgery) Mauro Kaufmann, RN as Oncology Nurse Navigator Rockwell Germany, RN as Oncology Nurse Navigator Fanny Skates, MD as Consulting Physician (General Surgery) Nicholas Lose, MD as Consulting Physician (Hematology and Oncology) Meda Klinefelter, MD as Referring Physician (Radiation Oncology)  DIAGNOSIS:  Encounter Diagnosis  Name Primary?   Malignant neoplasm of upper-outer quadrant of left breast in female, estrogen receptor positive ([place]) Yes    SUMMARY OF ONCOLOGIC HISTORY: Oncology History  Malignant neoplasm of upper-outer quadrant of left breast in female, estrogen receptor positive ([place])  04/03/2018 Initial Diagnosis   Screening detected left breast mass 1.9 cm UOQ, ultrasound revealed 1.4 x 0.7 x 1.2 cm mass 2 o'clock position 8 cm from the nipple, 2 axillary lymph nodes with thickened cortices measuring 6 mm, Biopsy of the left breast mass IDC with mucinous features grade 1-2 with DCIS intermediate grade, lymph node benign, ER 90%, PR 0%, Ki-67 10%, HER-2 equivocal by IHC FISH negative ratio 1.13 copy #1.7, T1c N0 stage Ia   04/30/2018 Cancer Staging   Staging form: Breast, AJCC 8th Edition - Clinical stage from 04/30/2018: Stage IA (cT1c, cN0, cM0, G2, ER+, PR-, HER2-) - Signed by Nicholas Lose, MD on 04/30/2018   06/06/2018 Surgery   Left lumpectomy: Mucinous adenocarcinoma grade 1, 1.8 cm, DCIS, intermediate grade, negative for LV I or PNI, margins negative, DCIS focal less than 1 mm anterior margin, 0/4 lymph nodes negative, ER 90%, PR 0%, HER-2 negative, Ki-67 10%, T1CN0 stage Ia   06/14/2018 Cancer Staging   Staging form: Breast, AJCC 8th Edition - Pathologic stage from 06/14/2018: Stage IA (pT1c, pN0(sn), cM0, G1, ER+, PR-, HER2-) - Signed by Nicholas Lose, MD on 06/14/2018   07/23/2018 - 08/16/2018 Radiation Therapy   Adjuvant  radiation at [facility] with Dr. Francesca Jewett: 42.56 Gy in 16 treatments   09/2018 -  Anti-estrogen oral therapy   Anastrozole daily     CHIEF COMPLIANT: Follow-up of left breast cancer on anastrozole    INTERVAL HISTORY: Melissa Rosales is a 77 y.o. with above-mentioned history of left breast cancer treated with lumpectomy, radiation, and who is currently on antiestrogen therapy with anastrozole. She presents to the clinic today for follow-up.     ALLERGIES:  is allergic to keflex [cephalexin].  MEDICATIONS:  Current Outpatient Medications  Medication Sig Dispense Refill   amLODipine (NORVASC) 10 MG tablet Take 10 mg by mouth daily.      anastrozole (ARIMIDEX) 1 MG tablet TAKE ONE TABLET BY MOUTH DAILY 30 tablet 0   aspirin EC 81 MG tablet Take 81 mg by mouth daily.     Calcium Carb-Cholecalciferol (CALCIUM + D3 PO) Take 1 tablet by mouth daily.      clotrimazole-betamethasone (LOTRISONE) cream Apply 1 application topically 2 (two) times daily as needed (for irritation).      Cyanocobalamin (VITAMIN B-12 PO) Take 1 tablet by mouth daily.     DULoxetine (CYMBALTA) 60 MG capsule Take 1 capsule (60 mg total) by mouth 2 (two) times daily. 60 capsule 5   enalapril (VASOTEC) 20 MG tablet Take 20 mg by mouth daily.      famotidine (PEPCID) 20 MG tablet Take 20 mg by mouth 2 (two) times daily.     FIBER FORMULA CAPS Take 1 capsule by mouth daily.     gabapentin (NEURONTIN) 600 MG tablet Take 1 tablet (600 mg total) by mouth  4 (four) times daily. Take 1 tablet by mouth 4 times daily 120 tablet 5   hydrochlorothiazide (HYDRODIURIL) 25 MG tablet Take 25 mg by mouth daily.     lactulose (CHRONULAC) 10 GM/15ML solution Take 10-20 g by mouth at bedtime as needed for moderate constipation.      meloxicam (MOBIC) 15 MG tablet Take 15 mg by mouth every morning.     mirabegron ER (MYRBETRIQ) 25 MG TB24 tablet Take 1 tablet (25 mg total) by mouth daily. 30 tablet 11   Multiple Vitamins-Minerals (MULTIVITAMIN  WITH MINERALS) tablet Take 1 tablet by mouth daily.     Omega 3 1000 MG CAPS Take 1,000 mg by mouth daily.      potassium chloride (K-DUR) 10 MEQ tablet Take 10 mEq by mouth daily.     ranitidine (ZANTAC) 150 MG tablet Take 150 mg by mouth daily.     vitamin C (ASCORBIC ACID) 500 MG tablet Take 500 mg by mouth daily.     No current facility-administered medications for this visit.    PHYSICAL EXAMINATION: ECOG PERFORMANCE STATUS: {CHL ONC ECOG PS:[id number]}  There were no vitals filed for this visit. There were no vitals filed for this visit.  BREAST:*** No palpable masses or nodules in either right or left breasts. No palpable axillary supraclavicular or infraclavicular adenopathy no breast tenderness or nipple discharge. (exam performed in the presence of a chaperone)  LABORATORY DATA:  I have reviewed the data as listed    Latest Ref Rng & Units 03/25/2021    4:08 PM 05/31/2018    3:25 PM 11/07/2016    6:29 AM  CMP  Glucose 70 - 99 mg/dL 84  89  91   BUN 8 - 23 mg/dL '18  14  18   '$ Creatinine 0.44 - 1.00 mg/dL 0.95  1.03  0.99   Sodium 135 - 145 mmol/L 137  136  141   Potassium 3.5 - 5.1 mmol/L 4.1  3.7  3.3   Chloride 98 - 111 mmol/L 101  101  105   CO2 22 - 32 mmol/L '28  27  26   '$ Calcium 8.9 - 10.3 mg/dL 9.9  9.7  9.5   Total Protein 6.5 - 8.1 g/dL 8.0  8.0    Total Bilirubin 0.3 - 1.2 mg/dL 0.7  0.9    Alkaline Phos 38 - 126 U/L 53  46    AST 15 - 41 U/L 24  23    ALT 0 - 44 U/L 22  19      Lab Results  Component Value Date   WBC 6.3 03/25/2021   HGB 11.8 (L) 03/25/2021   HCT 37.2 03/25/2021   MCV 91.4 03/25/2021   PLT 220 03/25/2021   NEUTROABS 4.0 03/25/2021    ASSESSMENT & PLAN:  No problem-specific Assessment & Plan notes found for this encounter.    No orders of the defined types were placed in this encounter.  The patient has a good understanding of the overall plan. she agrees with it. she will call with any problems that may develop before the next  visit here. Total time spent: 30 mins including face to face time and time spent for planning, charting and co-ordination of care   Suzzette Righter, [place] 06/28/22    I Gardiner Coins am acting as a Education administrator for [employer]  ***

## 2022-06-28 NOTE — Assessment & Plan Note (Deleted)
06/06/2018:Left lumpectomy: Mucinous adenocarcinoma grade 1, 1.8 cm, DCIS, intermediate grade, negative for LV I or PNI, margins negative, DCIS focal less than 1 mm anterior margin, 0/4 lymph nodes negative, ER 90%, PR 0%, HER-2 negative, Ki-67 10%, T1CN0 stage Ia   Adjuvant radiation therapy at [facility]   Current treatment: Adjuvant antiestrogen therapy with anastrozole 1 mg daily x5 to 7 years Anastrozole toxicities: Tolerating anastrozole extremely well. Occasional hot flashes and occasional muscle aches and pains.   Breast cancer surveillance: mammogram due 10/10/2022: Benign breast density category B Breast exam 06/28/2022: Benign   Legally blind. Return to clinic in 1 year for follow-up

## 2022-06-29 ENCOUNTER — Telehealth: Payer: Self-pay | Admitting: Hematology and Oncology

## 2022-06-29 NOTE — Telephone Encounter (Signed)
Per 2/6 IB, message has been left with appt and location of appt

## 2022-07-07 NOTE — Progress Notes (Incomplete)
Patient Care Team: Redmond School, MD as PCP - General (Internal Medicine) Iran Planas, MD as Consulting Physician (Orthopedic Surgery) Mauro Kaufmann, RN as Oncology Nurse Navigator Rockwell Germany, RN as Oncology Nurse Navigator Fanny Skates, MD as Consulting Physician (General Surgery) Nicholas Lose, MD as Consulting Physician (Hematology and Oncology) Meda Klinefelter, MD as Referring Physician (Radiation Oncology)  DIAGNOSIS: No diagnosis found.  SUMMARY OF ONCOLOGIC HISTORY: Oncology History  Malignant neoplasm of upper-outer quadrant of left breast in female, estrogen receptor positive ([place])  04/03/2018 Initial Diagnosis   Screening detected left breast mass 1.9 cm UOQ, ultrasound revealed 1.4 x 0.7 x 1.2 cm mass 2 o'clock position 8 cm from the nipple, 2 axillary lymph nodes with thickened cortices measuring 6 mm, Biopsy of the left breast mass IDC with mucinous features grade 1-2 with DCIS intermediate grade, lymph node benign, ER 90%, PR 0%, Ki-67 10%, HER-2 equivocal by IHC FISH negative ratio 1.13 copy #1.7, T1c N0 stage Ia   04/30/2018 Cancer Staging   Staging form: Breast, AJCC 8th Edition - Clinical stage from 04/30/2018: Stage IA (cT1c, cN0, cM0, G2, ER+, PR-, HER2-) - Signed by Nicholas Lose, MD on 04/30/2018   06/06/2018 Surgery   Left lumpectomy: Mucinous adenocarcinoma grade 1, 1.8 cm, DCIS, intermediate grade, negative for LV I or PNI, margins negative, DCIS focal less than 1 mm anterior margin, 0/4 lymph nodes negative, ER 90%, PR 0%, HER-2 negative, Ki-67 10%, T1CN0 stage Ia   06/14/2018 Cancer Staging   Staging form: Breast, AJCC 8th Edition - Pathologic stage from 06/14/2018: Stage IA (pT1c, pN0(sn), cM0, G1, ER+, PR-, HER2-) - Signed by Nicholas Lose, MD on 06/14/2018   07/23/2018 - 08/16/2018 Radiation Therapy   Adjuvant radiation at [facility] with Dr. Francesca Jewett: 42.56 Gy in 16 treatments   09/2018 -  Anti-estrogen oral therapy   Anastrozole daily      CHIEF COMPLIANT: Follow-up of left breast cancer on anastrozole   INTERVAL HISTORY: Melissa Rosales is a 77 y.o. with above-mentioned history of left breast cancer treated with lumpectomy, radiation, and who is currently on antiestrogen therapy with anastrozole. She presents to the clinic for a follow-up.    ALLERGIES:  is allergic to keflex [cephalexin].  MEDICATIONS:  Current Outpatient Medications  Medication Sig Dispense Refill   amLODipine (NORVASC) 10 MG tablet Take 10 mg by mouth daily.      anastrozole (ARIMIDEX) 1 MG tablet TAKE ONE TABLET BY MOUTH DAILY 30 tablet 0   aspirin EC 81 MG tablet Take 81 mg by mouth daily.     Calcium Carb-Cholecalciferol (CALCIUM + D3 PO) Take 1 tablet by mouth daily.      clotrimazole-betamethasone (LOTRISONE) cream Apply 1 application topically 2 (two) times daily as needed (for irritation).      Cyanocobalamin (VITAMIN B-12 PO) Take 1 tablet by mouth daily.     DULoxetine (CYMBALTA) 60 MG capsule Take 1 capsule (60 mg total) by mouth 2 (two) times daily. 60 capsule 5   enalapril (VASOTEC) 20 MG tablet Take 20 mg by mouth daily.      famotidine (PEPCID) 20 MG tablet Take 20 mg by mouth 2 (two) times daily.     FIBER FORMULA CAPS Take 1 capsule by mouth daily.     gabapentin (NEURONTIN) 600 MG tablet Take 1 tablet (600 mg total) by mouth 4 (four) times daily. Take 1 tablet by mouth 4 times daily 120 tablet 5   hydrochlorothiazide (HYDRODIURIL) 25 MG tablet Take 25  mg by mouth daily.     lactulose (CHRONULAC) 10 GM/15ML solution Take 10-20 g by mouth at bedtime as needed for moderate constipation.      meloxicam (MOBIC) 15 MG tablet Take 15 mg by mouth every morning.     mirabegron ER (MYRBETRIQ) 25 MG TB24 tablet Take 1 tablet (25 mg total) by mouth daily. 30 tablet 11   Multiple Vitamins-Minerals (MULTIVITAMIN WITH MINERALS) tablet Take 1 tablet by mouth daily.     Omega 3 1000 MG CAPS Take 1,000 mg by mouth daily.      potassium chloride  (K-DUR) 10 MEQ tablet Take 10 mEq by mouth daily.     ranitidine (ZANTAC) 150 MG tablet Take 150 mg by mouth daily.     vitamin C (ASCORBIC ACID) 500 MG tablet Take 500 mg by mouth daily.     No current facility-administered medications for this visit.    PHYSICAL EXAMINATION: ECOG PERFORMANCE STATUS: {CHL ONC ECOG PS:[id number]}  There were no vitals filed for this visit. There were no vitals filed for this visit.  BREAST:*** No palpable masses or nodules in either right or left breasts. No palpable axillary supraclavicular or infraclavicular adenopathy no breast tenderness or nipple discharge. (exam performed in the presence of a chaperone)  LABORATORY DATA:  I have reviewed the data as listed    Latest Ref Rng & Units 03/25/2021    4:08 PM 05/31/2018    3:25 PM 11/07/2016    6:29 AM  CMP  Glucose 70 - 99 mg/dL 84  89  91   BUN 8 - 23 mg/dL 18  14  18   $ Creatinine 0.44 - 1.00 mg/dL 0.95  1.03  0.99   Sodium 135 - 145 mmol/L 137  136  141   Potassium 3.5 - 5.1 mmol/L 4.1  3.7  3.3   Chloride 98 - 111 mmol/L 101  101  105   CO2 22 - 32 mmol/L 28  27  26   $ Calcium 8.9 - 10.3 mg/dL 9.9  9.7  9.5   Total Protein 6.5 - 8.1 g/dL 8.0  8.0    Total Bilirubin 0.3 - 1.2 mg/dL 0.7  0.9    Alkaline Phos 38 - 126 U/L 53  46    AST 15 - 41 U/L 24  23    ALT 0 - 44 U/L 22  19      Lab Results  Component Value Date   WBC 6.3 03/25/2021   HGB 11.8 (L) 03/25/2021   HCT 37.2 03/25/2021   MCV 91.4 03/25/2021   PLT 220 03/25/2021   NEUTROABS 4.0 03/25/2021    ASSESSMENT & PLAN:  No problem-specific Assessment & Plan notes found for this encounter.    No orders of the defined types were placed in this encounter.  The patient has a good understanding of the overall plan. she agrees with it. she will call with any problems that may develop before the next visit here. Total time spent: 30 mins including face to face time and time spent for planning, charting and co-ordination of care    Suzzette Righter, [place] 07/07/22    I Gardiner Coins am acting as a Education administrator for [employer]  ***

## 2022-07-08 ENCOUNTER — Inpatient Hospital Stay: Payer: 59 | Admitting: Hematology and Oncology

## 2022-07-08 NOTE — Assessment & Plan Note (Deleted)
06/06/2018:Left lumpectomy: Mucinous adenocarcinoma grade 1, 1.8 cm, DCIS, intermediate grade, negative for LV I or PNI, margins negative, DCIS focal less than 1 mm anterior margin, 0/4 lymph nodes negative, ER 90%, PR 0%, HER-2 negative, Ki-67 10%, T1CN0 stage Ia   Adjuvant radiation therapy at [facility]   Current treatment: Adjuvant antiestrogen therapy with anastrozole 1 mg daily x5 to 7 years started 10/10/2018 Anastrozole toxicities: Tolerating anastrozole extremely well. Occasional hot flashes and occasional muscle aches and pains.   mammogram due 10/10/2022: Benign breast density category B Breast exam 07/08/2022: Benign   Legally blind. Return to clinic in 1 year for follow-up

## 2022-07-20 ENCOUNTER — Other Ambulatory Visit: Payer: Self-pay | Admitting: Hematology and Oncology

## 2022-07-20 ENCOUNTER — Telehealth: Payer: Self-pay | Admitting: Hematology and Oncology

## 2022-07-20 NOTE — Telephone Encounter (Signed)
Per 2/28 IB scheduled patient, patient is aware of time and date of appointment.

## 2022-08-13 LAB — AMB RESULTS CONSOLE CBG: Glucose: 102

## 2022-08-13 NOTE — Progress Notes (Signed)
Gave the patients resources to help with pest problems.

## 2022-08-16 ENCOUNTER — Encounter: Payer: Self-pay | Admitting: *Deleted

## 2022-08-16 NOTE — Progress Notes (Signed)
Pt seen at 08/13/22 screening event where screening results were wnl. Pt identified Delman Cheadle NP as her PCP (same office as Dr. Gerarda Fraction, where [facility] review indicates pt had appt 7/23), so [facility] PCP updated. At event pt identified pest issue in home, for which she was given resources at the event. Per [facility] review, pt also has urology, oncology, and podiatry support. No additional health equity team support indicated at this time.

## 2022-08-22 ENCOUNTER — Other Ambulatory Visit: Payer: Self-pay | Admitting: Hematology and Oncology

## 2022-08-24 ENCOUNTER — Other Ambulatory Visit: Payer: Self-pay

## 2022-08-24 DIAGNOSIS — N3941 Urge incontinence: Secondary | ICD-10-CM

## 2022-08-24 MED ORDER — MIRABEGRON ER 25 MG PO TB24: 25.0000 mg | ORAL_TABLET | Freq: Every day | ORAL | 6 refills | Status: DC

## 2022-08-29 NOTE — Progress Notes (Signed)
Patient Care Team: Ladon ApplebaumJackson, Samantha J, PA-C as PCP - General (Family Medicine) Bradly Bienenstockrtmann, Fred, MD as Consulting Physician (Orthopedic Surgery) Pershing ProudStuart, Dawn C, RN as Oncology Nurse Navigator Donnelly AngelicaMartini, Keisha N, RN as Oncology Nurse Navigator Claud KelpIngram, Haywood, MD as Consulting Physician (General Surgery) Serena CroissantGudena, Vinay, MD as Consulting Physician (Hematology and Oncology) Stanford BreedYanagihara, Theodore, MD as Referring Physician (Radiation Oncology)  DIAGNOSIS: No diagnosis found.  SUMMARY OF ONCOLOGIC HISTORY: Oncology History  Malignant neoplasm of upper-outer quadrant of left breast in female, estrogen receptor positive  04/03/2018 Initial Diagnosis   Screening detected left breast mass 1.9 cm UOQ, ultrasound revealed 1.4 x 0.7 x 1.2 cm mass 2 o'clock position 8 cm from the nipple, 2 axillary lymph nodes with thickened cortices measuring 6 mm, Biopsy of the left breast mass IDC with mucinous features grade 1-2 with DCIS intermediate grade, lymph node benign, ER 90%, PR 0%, Ki-67 10%, HER-2 equivocal by IHC FISH negative ratio 1.13 copy #1.7, T1c N0 stage Ia   04/30/2018 Cancer Staging   Staging form: Breast, AJCC 8th Edition - Clinical stage from 04/30/2018: Stage IA (cT1c, cN0, cM0, G2, ER+, PR-, HER2-) - Signed by Serena CroissantGudena, Vinay, MD on 04/30/2018   06/06/2018 Surgery   Left lumpectomy: Mucinous adenocarcinoma grade 1, 1.8 cm, DCIS, intermediate grade, negative for LV I or PNI, margins negative, DCIS focal less than 1 mm anterior margin, 0/4 lymph nodes negative, ER 90%, PR 0%, HER-2 negative, Ki-67 10%, T1CN0 stage Ia   06/14/2018 Cancer Staging   Staging form: Breast, AJCC 8th Edition - Pathologic stage from 06/14/2018: Stage IA (pT1c, pN0(sn), cM0, G1, ER+, PR-, HER2-) - Signed by Serena CroissantGudena, Vinay, MD on 06/14/2018   07/23/2018 - 08/16/2018 Radiation Therapy   [facility] Dr[facility] Ernest HaberYanagihara: 42.56 Gy in 16 treatments   09/2018 -  Anti-estrogen oral therapy   Anastrozole daily      CHIEF COMPLIANT:   INTERVAL HISTORY: Melissa Rosales is a   ALLERGIES:  is allergic to keflex [cephalexin].  MEDICATIONS:  Current Outpatient Medications  Medication Sig Dispense Refill   amLODipine (NORVASC) 10 MG tablet Take 10 mg by mouth daily.      anastrozole (ARIMIDEX) 1 MG tablet TAKE ONE TABLET BY MOUTH DAILY 30 tablet 0   aspirin EC 81 MG tablet Take 81 mg by mouth daily.     Calcium Carb-Cholecalciferol (CALCIUM + D3 PO) Take 1 tablet by mouth daily.      clotrimazole-betamethasone (LOTRISONE) cream Apply 1 application topically 2 (two) times daily as needed (for irritation).      Cyanocobalamin (VITAMIN B-12 PO) Take 1 tablet by mouth daily.     DULoxetine (CYMBALTA) 60 MG capsule Take 1 capsule (60 mg total) by mouth 2 (two) times daily. 60 capsule 5   enalapril (VASOTEC) 20 MG tablet Take 20 mg by mouth daily.      famotidine (PEPCID) 20 MG tablet Take 20 mg by mouth 2 (two) times daily.     FIBER FORMULA CAPS Take 1 capsule by mouth daily.     gabapentin (NEURONTIN) 600 MG tablet Take 1 tablet (600 mg total) by mouth 4 (four) times daily. Take 1 tablet by mouth 4 times daily 120 tablet 5   hydrochlorothiazide (HYDRODIURIL) 25 MG tablet Take 25 mg by mouth daily.     lactulose (CHRONULAC) 10 GM/15ML solution Take 10-20 g by mouth at bedtime as needed for moderate constipation.      meloxicam (MOBIC) 15 MG tablet Take 15 mg  by mouth every morning.     mirabegron ER (MYRBETRIQ) 25 MG TB24 tablet Take 1 tablet (25 mg total) by mouth daily. 30 tablet 6   Multiple Vitamins-Minerals (MULTIVITAMIN WITH MINERALS) tablet Take 1 tablet by mouth daily.     Omega 3 1000 MG CAPS Take 1,000 mg by mouth daily.      potassium chloride (K-DUR) 10 MEQ tablet Take 10 mEq by mouth daily.     ranitidine (ZANTAC) 150 MG tablet Take 150 mg by mouth daily.     vitamin C (ASCORBIC ACID) 500 MG tablet Take 500 mg by mouth daily.     No current facility-administered medications for this  visit.    PHYSICAL EXAMINATION: ECOG PERFORMANCE STATUS: {CHL ONC ECOG PS:[id number]}  There were no vitals filed for this visit. There were no vitals filed for this visit.  BREAST:*** No palpable masses or nodules in either right or left breasts. No palpable axillary supraclavicular or infraclavicular adenopathy no breast tenderness or nipple discharge. (exam performed in the presence of a chaperone)  LABORATORY DATA:  I have reviewed the data as listed    Latest Ref Rng & Units 03/25/2021    4:08 PM 05/31/2018    3:25 PM 11/07/2016    6:29 AM  CMP  Glucose 70 - 99 mg/dL 84  89  91   BUN 8 - 23 mg/dL 18  14  18    Creatinine 0.44 - 1.00 mg/dL 7.00  1.74  9.44   Sodium 135 - 145 mmol/L 137  136  141   Potassium 3.5 - 5.1 mmol/L 4.1  3.7  3.3   Chloride 98 - 111 mmol/L 101  101  105   CO2 22 - 32 mmol/L 28  27  26    Calcium 8.9 - 10.3 mg/dL 9.9  9.7  9.5   Total Protein 6.5 - 8.1 g/dL 8.0  8.0    Total Bilirubin 0.3 - 1.2 mg/dL 0.7  0.9    Alkaline Phos 38 - 126 U/L 53  46    AST 15 - 41 U/L 24  23    ALT 0 - 44 U/L 22  19      Lab Results  Component Value Date   WBC 6.3 03/25/2021   HGB 11.8 (L) 03/25/2021   HCT 37.2 03/25/2021   MCV 91.4 03/25/2021   PLT 220 03/25/2021   NEUTROABS 4.0 03/25/2021    ASSESSMENT & PLAN:  No problem-specific Assessment & Plan notes found for this encounter.    No orders of the defined types were placed in this encounter.  The patient has a good understanding of the overall plan. she agrees with it. she will call with any problems that may develop before the next visit here. Total time spent: 30 mins including face to face time and time spent for planning, charting and co-ordination of care   Sherlyn Lick, CMA 08/29/22    I Janan Ridge am acting as a Neurosurgeon for [employer]  ***

## 2022-08-30 ENCOUNTER — Other Ambulatory Visit: Payer: Self-pay

## 2022-08-30 ENCOUNTER — Inpatient Hospital Stay: Payer: 59 | Attending: Hematology and Oncology | Admitting: Hematology and Oncology

## 2022-08-30 VITALS — BP 129/68 | HR 69 | Temp 97.6°F | Resp 18 | Ht 66.0 in | Wt 213.6 lb

## 2022-08-30 DIAGNOSIS — Z923 Personal history of irradiation: Secondary | ICD-10-CM | POA: Diagnosis not present

## 2022-08-30 DIAGNOSIS — Z79899 Other long term (current) drug therapy: Secondary | ICD-10-CM | POA: Diagnosis not present

## 2022-08-30 DIAGNOSIS — Z170 Estrogen receptor positive status [ER+]: Secondary | ICD-10-CM | POA: Diagnosis not present

## 2022-08-30 DIAGNOSIS — C50412 Malignant neoplasm of upper-outer quadrant of left female breast: Secondary | ICD-10-CM | POA: Diagnosis not present

## 2022-08-30 DIAGNOSIS — Z79811 Long term (current) use of aromatase inhibitors: Secondary | ICD-10-CM | POA: Insufficient documentation

## 2022-08-30 NOTE — Assessment & Plan Note (Addendum)
06/06/2018:Left lumpectomy: Mucinous adenocarcinoma grade 1, 1.8 cm, DCIS, intermediate grade, negative for LV I or PNI, margins negative, DCIS focal less than 1 mm anterior margin, 0/4 lymph nodes negative, ER 90%, PR 0%, HER-2 negative, Ki-67 10%, T1CN0 stage Ia   Adjuvant radiation therapy at [facility]   Current treatment: Adjuvant antiestrogen therapy with anastrozole 1 mg daily x5 years started May 2020  Anastrozole toxicities: Tolerating anastrozole extremely well. Occasional hot flashes and occasional muscle aches and pains.   Breast cancer surveillance: mammogram 06/27/2022: Benign breast density category B Breast exam 08/30/2022: Benign   Legally blind. Return to clinic in 1 year for follow-up

## 2022-08-31 ENCOUNTER — Telehealth: Payer: Self-pay | Admitting: Hematology and Oncology

## 2022-08-31 NOTE — Telephone Encounter (Signed)
Scheduled appointment per los. Left voicemail. 

## 2022-09-06 DIAGNOSIS — M79675 Pain in left toe(s): Secondary | ICD-10-CM | POA: Diagnosis not present

## 2022-09-06 DIAGNOSIS — L84 Corns and callosities: Secondary | ICD-10-CM | POA: Diagnosis not present

## 2022-09-06 DIAGNOSIS — B351 Tinea unguium: Secondary | ICD-10-CM | POA: Diagnosis not present

## 2022-09-20 ENCOUNTER — Other Ambulatory Visit: Payer: Self-pay | Admitting: Hematology and Oncology

## 2022-10-19 ENCOUNTER — Emergency Department (HOSPITAL_COMMUNITY): Payer: 59

## 2022-10-19 ENCOUNTER — Encounter (HOSPITAL_COMMUNITY): Payer: Self-pay | Admitting: Emergency Medicine

## 2022-10-19 ENCOUNTER — Emergency Department (HOSPITAL_COMMUNITY)
Admission: EM | Admit: 2022-10-19 | Discharge: 2022-10-19 | Disposition: A | Discharge status: Home / Self Care | Payer: 59 | Attending: Emergency Medicine | Admitting: Emergency Medicine

## 2022-10-19 ENCOUNTER — Other Ambulatory Visit: Payer: Self-pay

## 2022-10-19 DIAGNOSIS — R350 Frequency of micturition: Secondary | ICD-10-CM | POA: Diagnosis present

## 2022-10-19 DIAGNOSIS — N3000 Acute cystitis without hematuria: Secondary | ICD-10-CM | POA: Diagnosis not present

## 2022-10-19 DIAGNOSIS — M79661 Pain in right lower leg: Secondary | ICD-10-CM | POA: Diagnosis not present

## 2022-10-19 DIAGNOSIS — Z7982 Long term (current) use of aspirin: Secondary | ICD-10-CM | POA: Insufficient documentation

## 2022-10-19 DIAGNOSIS — E860 Dehydration: Secondary | ICD-10-CM | POA: Diagnosis not present

## 2022-10-19 DIAGNOSIS — R410 Disorientation, unspecified: Secondary | ICD-10-CM | POA: Insufficient documentation

## 2022-10-19 DIAGNOSIS — W010XXA Fall on same level from slipping, tripping and stumbling without subsequent striking against object, initial encounter: Secondary | ICD-10-CM | POA: Insufficient documentation

## 2022-10-19 LAB — CBC
HCT: 36.7 % (ref 36.0–46.0)
Hemoglobin: 11.7 g/dL — ABNORMAL LOW (ref 12.0–15.0)
MCH: 29.3 pg (ref 26.0–34.0)
MCHC: 31.9 g/dL (ref 30.0–36.0)
MCV: 91.8 fL (ref 80.0–100.0)
Platelets: 213 10*3/uL (ref 150–400)
RBC: 4 MIL/uL (ref 3.87–5.11)
RDW: 15.1 % (ref 11.5–15.5)
WBC: 4.9 10*3/uL (ref 4.0–10.5)
nRBC: 0 % (ref 0.0–0.2)

## 2022-10-19 LAB — COMPREHENSIVE METABOLIC PANEL
ALT: 25 U/L (ref 0–44)
AST: 26 U/L (ref 15–41)
Albumin: 3.8 g/dL (ref 3.5–5.0)
Alkaline Phosphatase: 46 U/L (ref 38–126)
Anion gap: 9 (ref 5–15)
BUN: 26 mg/dL — ABNORMAL HIGH (ref 8–23)
CO2: 25 mmol/L (ref 22–32)
Calcium: 9 mg/dL (ref 8.9–10.3)
Chloride: 102 mmol/L (ref 98–111)
Creatinine, Ser: 1.14 mg/dL — ABNORMAL HIGH (ref 0.44–1.00)
GFR, Estimated: 50 mL/min — ABNORMAL LOW (ref >60)
Glucose, Bld: 97 mg/dL (ref 70–99)
Potassium: 3.9 mmol/L (ref 3.5–5.1)
Sodium: 136 mmol/L (ref 135–145)
Total Bilirubin: 0.6 mg/dL (ref 0.3–1.2)
Total Protein: 7.7 g/dL (ref 6.5–8.1)

## 2022-10-19 LAB — URINALYSIS, ROUTINE W REFLEX MICROSCOPIC
Bilirubin Urine: NEGATIVE
Glucose, UA: NEGATIVE mg/dL
Hgb urine dipstick: NEGATIVE
Ketones, ur: NEGATIVE mg/dL
Nitrite: NEGATIVE
Protein, ur: 30 mg/dL — AB
Specific Gravity, Urine: 1.023 (ref 1.005–1.030)
pH: 5 (ref 5.0–8.0)

## 2022-10-19 LAB — CBG MONITORING, ED: Glucose-Capillary: 77 mg/dL (ref 70–99)

## 2022-10-19 MED ORDER — SULFAMETHOXAZOLE-TRIMETHOPRIM 800-160 MG PO TABS: 1.0000 | ORAL_TABLET | Freq: Two times a day (BID) | ORAL | 0 refills | Status: AC

## 2022-10-19 MED ORDER — SULFAMETHOXAZOLE-TRIMETHOPRIM 800-160 MG PO TABS
1.0000 | ORAL_TABLET | Freq: Once | ORAL | Status: AC
  Administered 2022-10-19: 1 via ORAL
  Filled 2022-10-19: qty 1

## 2022-10-19 MED ORDER — SODIUM CHLORIDE 0.9 % IV BOLUS
1000.0000 mL | Freq: Once | INTRAVENOUS | Status: AC
  Administered 2022-10-19: 1000 mL via INTRAVENOUS

## 2022-10-19 NOTE — Discharge Instructions (Signed)
You were mildly dehydrated and it appears that you may have an infection in your urine so you are being placed on an antibiotic.  Take your next dose of the antibiotic tomorrow morning.  I do recommend follow-up with your primary doctor for recheck if you are not starting to feel better over the next several days or if you feel any worse, return here for recheck.  Make sure you are drinking plenty of fluids to avoid dehydration.

## 2022-10-19 NOTE — ED Provider Triage Note (Signed)
Emergency Medicine Provider Triage Evaluation Note  Melissa Rosales , a 77 y.o. female  was evaluated in triage.  Pt complains of fall which occurred 4 days ago, tripped over a pet gate landed on the floor and 2 days later woke with significant pain in her right lower leg.  Additionally neighbor and a family member, Melissa Rosales who patient called on her phone who is her cousin added that Melissa Rosales has had intermittent episodes of confusion since Monday.  She has had reduced appetite and has complaints of fatigue since then.    Review of Systems  Positive: Fall, confusion, generalized fatigue loss of appetite Negative: No obvious fevers, vomiting, denies chest pain, shortness of breath, abdominal pain  Physical Exam  BP 127/79 (BP Location: Right Arm)   Pulse 66   Temp 97.8 F (36.6 C)   Resp 18   Ht 5\' 6"  (1.676 m)   Wt 96.6 kg   SpO2 98%   BMI 34.38 kg/m  Gen:   Awake, no distress   Resp:  Normal effort  MSK:   Moves extremities without difficulty  Other:    Medical Decision Making  Medically screening exam initiated at 4:08 PM.  Appropriate orders placed.  Melissa Rosales was informed that the remainder of the evaluation will be completed by another provider, this initial triage assessment does not replace that evaluation, and the importance of remaining in the ED until their evaluation is complete.     Burgess Amor, PA-C 10/19/22 1610

## 2022-10-19 NOTE — ED Triage Notes (Signed)
Pt c/o fall after she tripped over a short gate on Saturday. She denies pain/injury but says she wants to make sure everything is ok. Pt denies hitting her head; no thinners. Possible soreness to right shin but otherwise no physical complaints noted. A/O x 4, amb w cane

## 2022-10-19 NOTE — ED Provider Notes (Signed)
[place] EMERGENCY DEPARTMENT AT [facility] Provider Note   CSN: [id number] Arrival date & time: 10/19/22  1441     History  Chief Complaint  Patient presents with   Fall    Melissa Rosales is a 77 y.o. female presenting for evaluation of right lower extremity pain since tripping over a pet gate 4 days ago.  She denies head or other injury with this fall and is not on anticoagulants.  She does endorse generalized fatigue before and since this episode and family states mild confusion 2 days ago, resolved today, but has had uti's in the past. She denies cp, sob, headache, dizziness, n/v, palpitation, abd pain.  She does endorse increased urinary frequency.   The history is provided by the patient and a friend.       Home Medications Prior to Admission medications   Medication Sig Start Date End Date Taking? Authorizing Provider  sulfamethoxazole-trimethoprim (BACTRIM DS) 800-160 MG tablet Take 1 tablet by mouth 2 (two) times daily for 7 days. 10/19/22 10/26/22 Yes Ayesha Markwell, Raynelle Fanning, PA-C  amLODipine (NORVASC) 10 MG tablet Take 10 mg by mouth daily.     [provider]  anastrozole (ARIMIDEX) 1 MG tablet TAKE ONE TABLET BY MOUTH DAILY 09/20/22   Serena Croissant, MD  aspirin EC 81 MG tablet Take 81 mg by mouth daily.    [provider]  Calcium Carb-Cholecalciferol (CALCIUM + D3 PO) Take 1 tablet by mouth daily.     [provider]  clotrimazole-betamethasone (LOTRISONE) cream Apply 1 application topically 2 (two) times daily as needed (for irritation).     [provider]  Cyanocobalamin (VITAMIN B-12 PO) Take 1 tablet by mouth daily.    [provider]  DULoxetine (CYMBALTA) 60 MG capsule Take 1 capsule (60 mg total) by mouth 2 (two) times daily. 05/18/21   Glean Salvo, NP  enalapril (VASOTEC) 20 MG tablet Take 20 mg by mouth daily.     [provider]  famotidine (PEPCID) 20 MG tablet Take 20 mg by mouth 2 (two) times daily.  03/01/21   [provider]  FIBER FORMULA CAPS Take 1 capsule by mouth daily.    [provider]  gabapentin (NEURONTIN) 600 MG tablet Take 1 tablet (600 mg total) by mouth 4 (four) times daily. Take 1 tablet by mouth 4 times daily 05/18/21   Glean Salvo, NP  hydrochlorothiazide (HYDRODIURIL) 25 MG tablet Take 25 mg by mouth daily.    [provider]  lactulose (CHRONULAC) 10 GM/15ML solution Take 10-20 g by mouth at bedtime as needed for moderate constipation.     [provider]  meloxicam (MOBIC) 15 MG tablet Take 15 mg by mouth every morning. 06/29/21   [provider]  mirabegron ER (MYRBETRIQ) 25 MG TB24 tablet Take 1 tablet (25 mg total) by mouth daily. 08/24/22   McKenzie, Mardene Celeste, MD  Multiple Vitamins-Minerals (MULTIVITAMIN WITH MINERALS) tablet Take 1 tablet by mouth daily.    [provider]  Omega 3 1000 MG CAPS Take 1,000 mg by mouth daily.     [provider]  potassium chloride (K-DUR) 10 MEQ tablet Take 10 mEq by mouth daily. 05/05/17   [provider]  ranitidine (ZANTAC) 150 MG tablet Take 150 mg by mouth daily.    [provider]  vitamin C (ASCORBIC ACID) 500 MG tablet Take 500 mg by mouth daily.    [provider]  Allergies    Keflex [cephalexin]    Review of Systems   Review of Systems  Constitutional:  Positive for fatigue. Negative for fever.  HENT:  Negative for congestion and sore throat.   Eyes: Negative.   Respiratory:  Negative for chest tightness and shortness of breath.   Cardiovascular:  Negative for chest pain.  Gastrointestinal:  Negative for abdominal pain and nausea.  Genitourinary:  Positive for frequency. Negative for dysuria and urgency.  Musculoskeletal:  Positive for arthralgias. Negative for joint swelling and neck pain.  Skin: Negative.  Negative for rash and wound.  Neurological:  Negative for dizziness, weakness, light-headedness, numbness and  headaches.  Psychiatric/Behavioral: Negative.    All other systems reviewed and are negative.   Physical Exam Updated Vital Signs BP 127/79 (BP Location: Right Arm)   Pulse 66   Temp 97.8 F (36.6 C)   Resp 18   Ht 5\' 6"  (1.676 m)   Wt 96.6 kg   SpO2 98%   BMI 34.38 kg/m  Physical Exam Vitals and nursing note reviewed.  Constitutional:      Appearance: She is well-developed.  HENT:     Head: Normocephalic and atraumatic.  Eyes:     Conjunctiva/sclera: Conjunctivae normal.  Cardiovascular:     Rate and Rhythm: Normal rate and regular rhythm.     Heart sounds: Normal heart sounds.  Pulmonary:     Effort: Pulmonary effort is normal.     Breath sounds: Normal breath sounds. No wheezing.  Abdominal:     General: Bowel sounds are normal.     Palpations: Abdomen is soft.     Tenderness: There is no abdominal tenderness.  Musculoskeletal:        General: Tenderness present. Normal range of motion.     Cervical back: Normal range of motion.     Comments: Ttp right mid tibia, no deformity or edema.  Skin:    General: Skin is warm and dry.  Neurological:     Mental Status: She is alert.     ED Results / Procedures / Treatments   Labs (all labs ordered are listed, but only abnormal results are displayed) Labs Reviewed  URINE CULTURE - Abnormal; Notable for the following components:      Result Value   Culture MULTIPLE SPECIES PRESENT, SUGGEST RECOLLECTION (*)    All other components within normal limits  COMPREHENSIVE METABOLIC PANEL - Abnormal; Notable for the following components:   BUN 26 (*)    Creatinine, Ser 1.14 (*)    GFR, Estimated 50 (*)    All other components within normal limits  CBC - Abnormal; Notable for the following components:   Hemoglobin 11.7 (*)    All other components within normal limits  URINALYSIS, ROUTINE W REFLEX MICROSCOPIC - Abnormal; Notable for the following components:   APPearance HAZY (*)    Protein, ur 30 (*)    Leukocytes,Ua  MODERATE (*)    Bacteria, UA FEW (*)    All other components within normal limits  CBG MONITORING, ED    EKG None  Radiology No results found.  Procedures Procedures    Medications Ordered in ED Medications  sodium chloride 0.9 % bolus 1,000 mL (0 mLs Intravenous Stopped 10/19/22 2120)  sulfamethoxazole-trimethoprim (BACTRIM DS) 800-160 MG per tablet 1 tablet (1 tablet Oral Given 10/19/22 2148)    ED Course/ Medical Decision Making/ A&P  Medical Decision Making Pt presenting for evaluation of fatigue after a trip and fall occurring 4 days ago. No headache or focal neuro deficits on exam.  Given hx of fall and age,  CT brain imaging completed to ensure no subdural.  Ddx also including dehydration, electrolyte disturbance, uti.   Amount and/or Complexity of Data Reviewed Labs: ordered.    Details: Labs confirming uti with bacteria and moderate leukocytes. Cmet with bun 26, creatinine 1.14, mild dehydration. Pt was given NS 1 L and started on bactrim.  Felt better at time of dc.  Radiology: ordered and independent interpretation performed.    Details: Ct head negative acute finding.  Tib/fib neg fx.   Risk Prescription drug management.           Final Clinical Impression(s) / ED Diagnoses Final diagnoses:  Acute cystitis without hematuria  Dehydration    Rx / DC Orders ED Discharge Orders          Ordered    sulfamethoxazole-trimethoprim (BACTRIM DS) 800-160 MG tablet  2 times daily        10/19/22 2143              Burgess Amor, PA-C 10/22/22 2225    Vanetta Mulders, MD 10/22/22 [phone]

## 2022-10-21 LAB — URINE CULTURE

## 2022-11-01 IMAGING — MG DIGITAL DIAGNOSTIC BILAT W/ TOMO W/ CAD
6 of 9 series · 6 of 25 positions shown · non-contrast
Comparison: Previous exam(s).

CLINICAL DATA: History of LEFT breast cancer in 9898 status post
lumpectomy and radiation therapy.

EXAM:
DIGITAL DIAGNOSTIC BILATERAL MAMMOGRAM WITH TOMOSYNTHESIS AND CAD
TECHNIQUE: Bilateral digital diagnostic mammography and breast tomosynthesis
was performed. The images were evaluated with computer-aided
detection.

[L MLO]
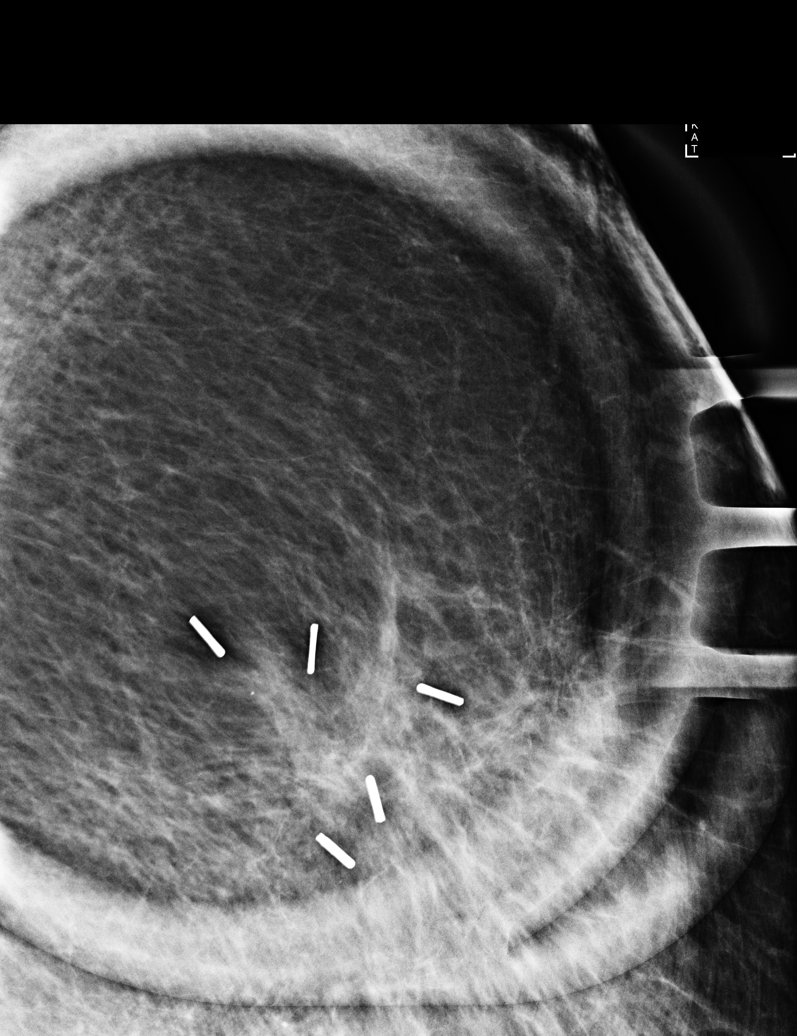

[R CC synth-2D]
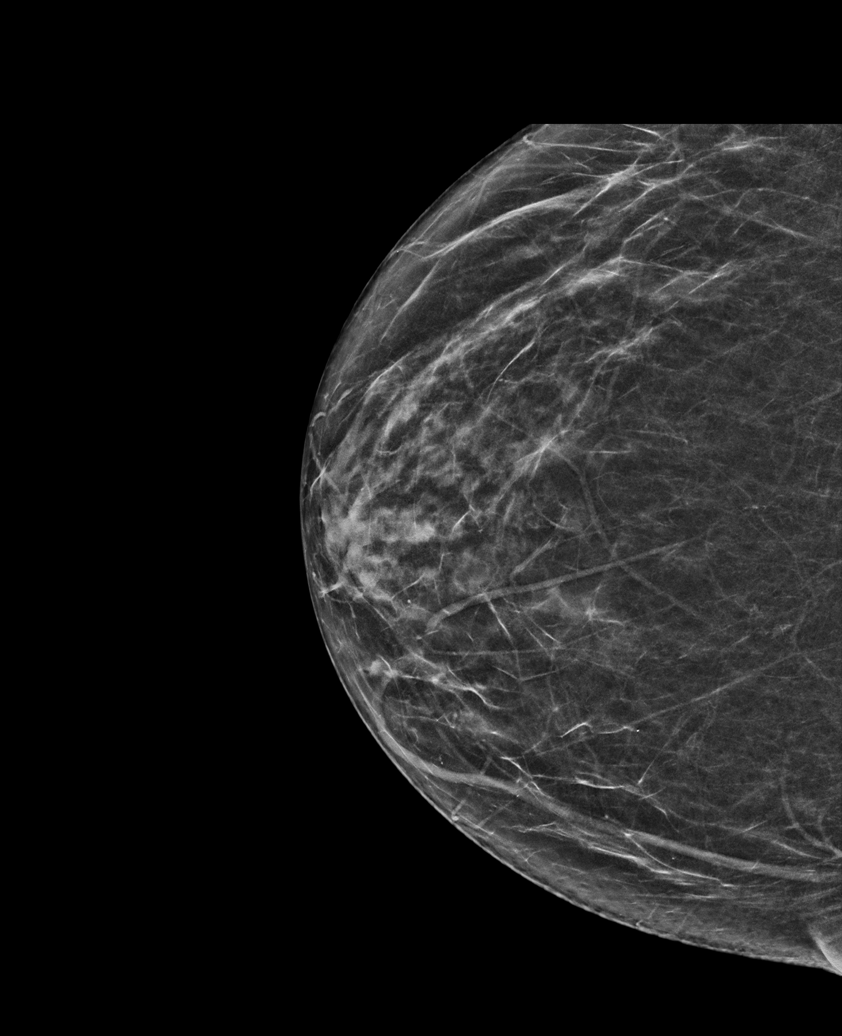

[L CC synth-2D]
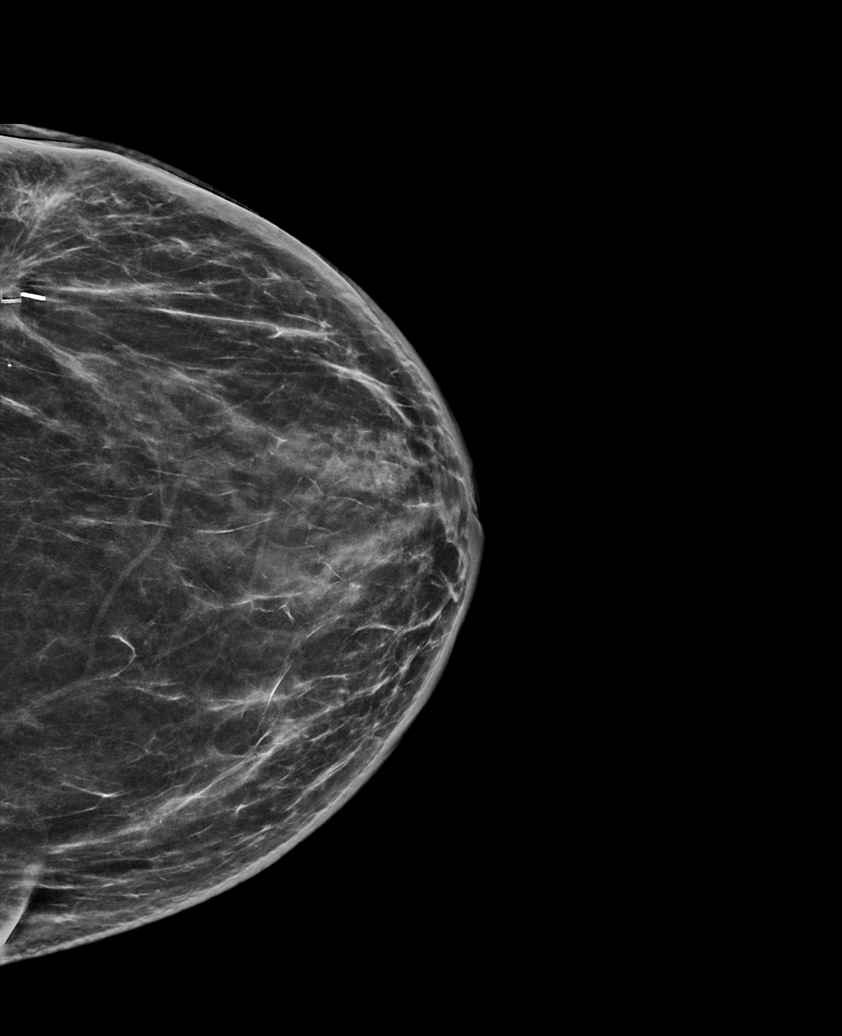

[L MLO synth-2D]
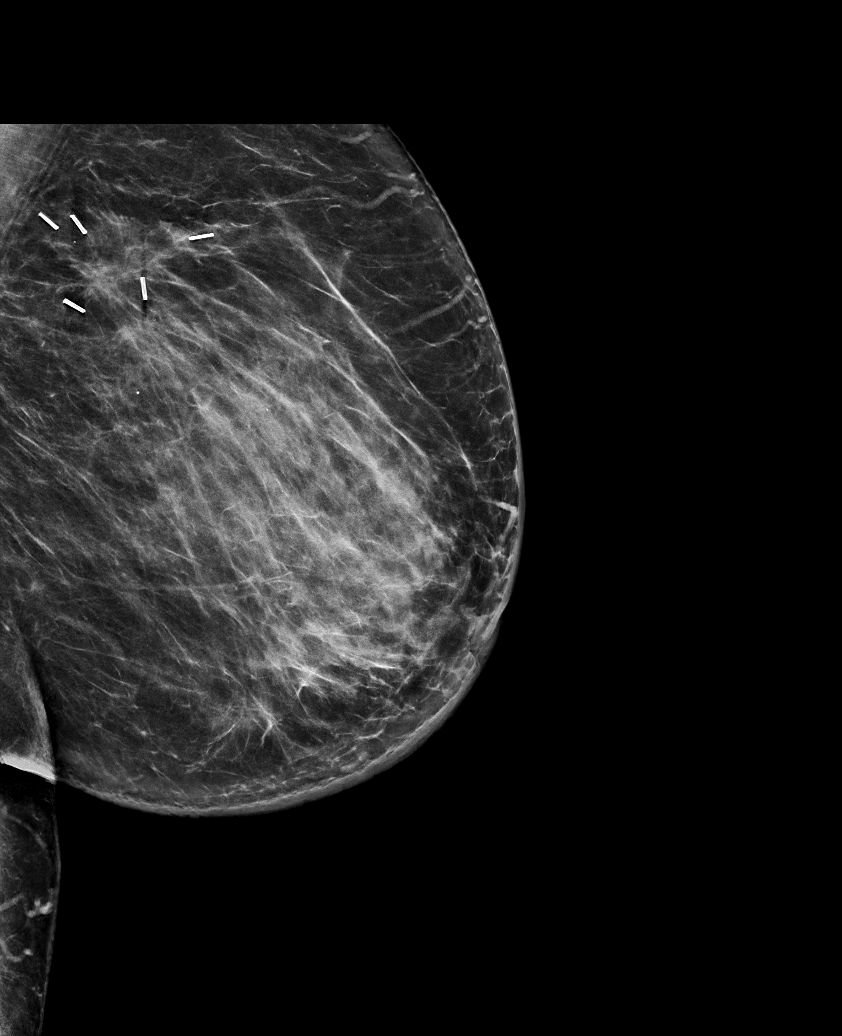

[R MLO synth-2D]
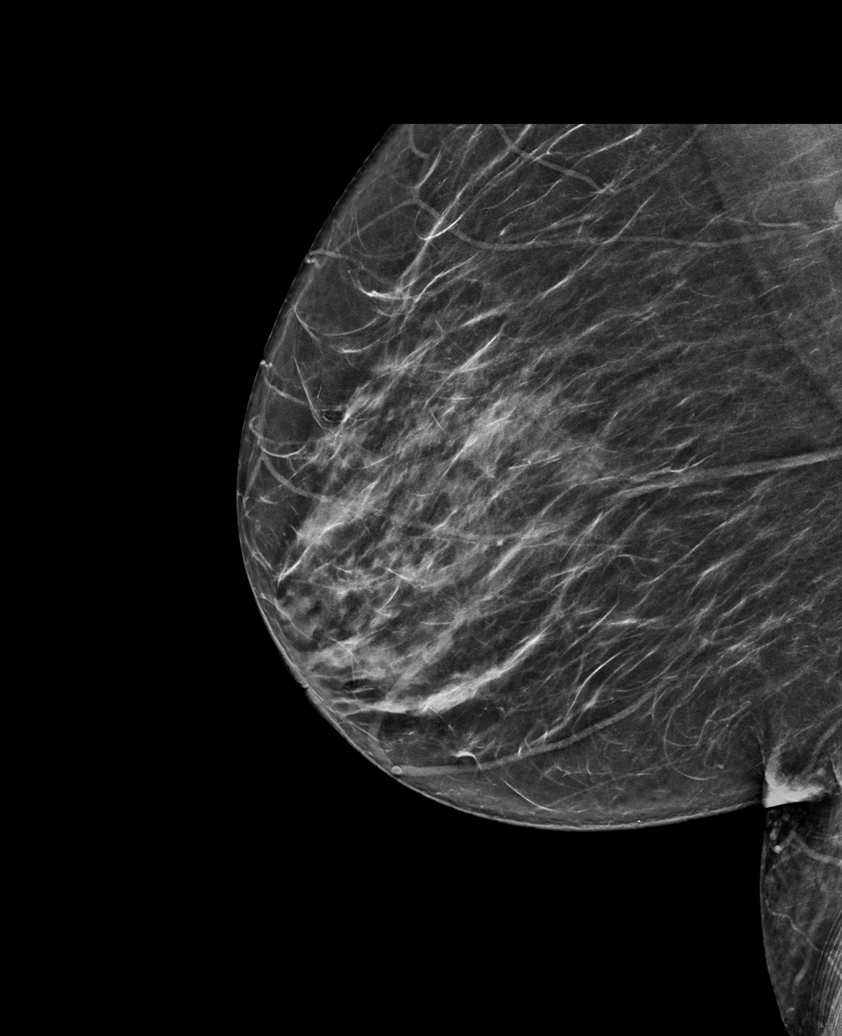

[R MLO tomo · tomo slice 35/70.0]
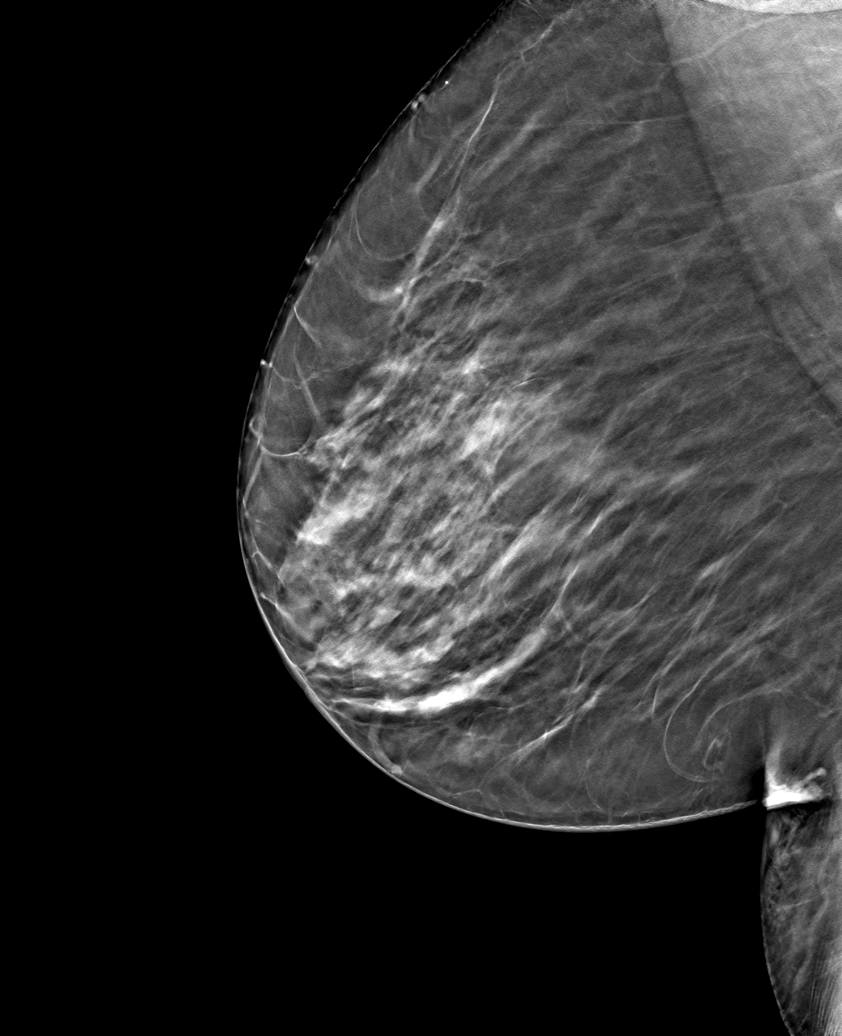

[6 of 25 positions shown; findings below may reference images not displayed]

ACR Breast Density Category b: There are scattered areas of
fibroglandular density.
FINDINGS: There are stable postsurgical changes within the LEFT breast. There
are no new dominant masses, suspicious calcifications or secondary
signs of malignancy within either breast.
IMPRESSION: No evidence of malignancy within either breast. Stable postsurgical
changes within the LEFT breast.

RECOMMENDATION:
1.  Screening mammogram in one year.(Code:[id number])
2. Per protocol, as the patient is now 2 or more years status post
lumpectomy, she may return to annual screening mammography in 1
year. However, given the history of breast cancer, the patient
remains eligible for annual diagnostic mammography if preferred.

I have discussed the findings and recommendations with the patient.
If applicable, a reminder letter will be sent to the patient
regarding the next appointment.

BI-RADS CATEGORY  2: Benign.

## 2022-12-06 DIAGNOSIS — R897 Abnormal histological findings in specimens from other organs, systems and tissues: Secondary | ICD-10-CM | POA: Diagnosis not present

## 2023-01-30 ENCOUNTER — Ambulatory Visit: Payer: 59 | Admitting: Urology

## 2023-02-03 DIAGNOSIS — G603 Idiopathic progressive neuropathy: Secondary | ICD-10-CM | POA: Diagnosis not present

## 2023-02-03 DIAGNOSIS — S46101A Unspecified injury of muscle, fascia and tendon of long head of biceps, right arm, initial encounter: Secondary | ICD-10-CM | POA: Diagnosis not present

## 2023-02-03 DIAGNOSIS — M5441 Lumbago with sciatica, right side: Secondary | ICD-10-CM | POA: Diagnosis not present

## 2023-02-03 DIAGNOSIS — M25551 Pain in right hip: Secondary | ICD-10-CM | POA: Diagnosis not present

## 2023-02-06 DIAGNOSIS — B351 Tinea unguium: Secondary | ICD-10-CM | POA: Diagnosis not present

## 2023-02-06 DIAGNOSIS — M79675 Pain in left toe(s): Secondary | ICD-10-CM | POA: Diagnosis not present

## 2023-02-06 DIAGNOSIS — L84 Corns and callosities: Secondary | ICD-10-CM | POA: Diagnosis not present

## 2023-02-13 DIAGNOSIS — M62551 Muscle wasting and atrophy, not elsewhere classified, right thigh: Secondary | ICD-10-CM | POA: Diagnosis not present

## 2023-02-13 DIAGNOSIS — M62561 Muscle wasting and atrophy, not elsewhere classified, right lower leg: Secondary | ICD-10-CM | POA: Diagnosis not present

## 2023-02-13 DIAGNOSIS — M5416 Radiculopathy, lumbar region: Secondary | ICD-10-CM | POA: Diagnosis not present

## 2023-02-13 DIAGNOSIS — M2569 Stiffness of other specified joint, not elsewhere classified: Secondary | ICD-10-CM | POA: Diagnosis not present

## 2023-02-17 DIAGNOSIS — M62561 Muscle wasting and atrophy, not elsewhere classified, right lower leg: Secondary | ICD-10-CM | POA: Diagnosis not present

## 2023-02-17 DIAGNOSIS — M62551 Muscle wasting and atrophy, not elsewhere classified, right thigh: Secondary | ICD-10-CM | POA: Diagnosis not present

## 2023-02-17 DIAGNOSIS — M5416 Radiculopathy, lumbar region: Secondary | ICD-10-CM | POA: Diagnosis not present

## 2023-02-17 DIAGNOSIS — M2569 Stiffness of other specified joint, not elsewhere classified: Secondary | ICD-10-CM | POA: Diagnosis not present

## 2023-02-21 DIAGNOSIS — M5416 Radiculopathy, lumbar region: Secondary | ICD-10-CM | POA: Diagnosis not present

## 2023-02-21 DIAGNOSIS — M62551 Muscle wasting and atrophy, not elsewhere classified, right thigh: Secondary | ICD-10-CM | POA: Diagnosis not present

## 2023-02-21 DIAGNOSIS — M62561 Muscle wasting and atrophy, not elsewhere classified, right lower leg: Secondary | ICD-10-CM | POA: Diagnosis not present

## 2023-02-21 DIAGNOSIS — M2569 Stiffness of other specified joint, not elsewhere classified: Secondary | ICD-10-CM | POA: Diagnosis not present

## 2023-02-22 DIAGNOSIS — M2569 Stiffness of other specified joint, not elsewhere classified: Secondary | ICD-10-CM | POA: Diagnosis not present

## 2023-02-22 DIAGNOSIS — M62551 Muscle wasting and atrophy, not elsewhere classified, right thigh: Secondary | ICD-10-CM | POA: Diagnosis not present

## 2023-02-22 DIAGNOSIS — M5416 Radiculopathy, lumbar region: Secondary | ICD-10-CM | POA: Diagnosis not present

## 2023-02-22 DIAGNOSIS — M62561 Muscle wasting and atrophy, not elsewhere classified, right lower leg: Secondary | ICD-10-CM | POA: Diagnosis not present

## 2023-02-28 DIAGNOSIS — M62561 Muscle wasting and atrophy, not elsewhere classified, right lower leg: Secondary | ICD-10-CM | POA: Diagnosis not present

## 2023-02-28 DIAGNOSIS — M2569 Stiffness of other specified joint, not elsewhere classified: Secondary | ICD-10-CM | POA: Diagnosis not present

## 2023-02-28 DIAGNOSIS — M62551 Muscle wasting and atrophy, not elsewhere classified, right thigh: Secondary | ICD-10-CM | POA: Diagnosis not present

## 2023-02-28 DIAGNOSIS — M5416 Radiculopathy, lumbar region: Secondary | ICD-10-CM | POA: Diagnosis not present

## 2023-03-02 DIAGNOSIS — M2569 Stiffness of other specified joint, not elsewhere classified: Secondary | ICD-10-CM | POA: Diagnosis not present

## 2023-03-02 DIAGNOSIS — M62561 Muscle wasting and atrophy, not elsewhere classified, right lower leg: Secondary | ICD-10-CM | POA: Diagnosis not present

## 2023-03-02 DIAGNOSIS — M62551 Muscle wasting and atrophy, not elsewhere classified, right thigh: Secondary | ICD-10-CM | POA: Diagnosis not present

## 2023-03-02 DIAGNOSIS — M5416 Radiculopathy, lumbar region: Secondary | ICD-10-CM | POA: Diagnosis not present

## 2023-03-06 DIAGNOSIS — R195 Other fecal abnormalities: Secondary | ICD-10-CM | POA: Diagnosis not present

## 2023-03-08 DIAGNOSIS — M2569 Stiffness of other specified joint, not elsewhere classified: Secondary | ICD-10-CM | POA: Diagnosis not present

## 2023-03-08 DIAGNOSIS — M5416 Radiculopathy, lumbar region: Secondary | ICD-10-CM | POA: Diagnosis not present

## 2023-03-08 DIAGNOSIS — M62551 Muscle wasting and atrophy, not elsewhere classified, right thigh: Secondary | ICD-10-CM | POA: Diagnosis not present

## 2023-03-08 DIAGNOSIS — M62561 Muscle wasting and atrophy, not elsewhere classified, right lower leg: Secondary | ICD-10-CM | POA: Diagnosis not present

## 2023-03-10 DIAGNOSIS — M62561 Muscle wasting and atrophy, not elsewhere classified, right lower leg: Secondary | ICD-10-CM | POA: Diagnosis not present

## 2023-03-10 DIAGNOSIS — M5416 Radiculopathy, lumbar region: Secondary | ICD-10-CM | POA: Diagnosis not present

## 2023-03-10 DIAGNOSIS — M62551 Muscle wasting and atrophy, not elsewhere classified, right thigh: Secondary | ICD-10-CM | POA: Diagnosis not present

## 2023-03-10 DIAGNOSIS — M2569 Stiffness of other specified joint, not elsewhere classified: Secondary | ICD-10-CM | POA: Diagnosis not present

## 2023-03-14 DIAGNOSIS — M62561 Muscle wasting and atrophy, not elsewhere classified, right lower leg: Secondary | ICD-10-CM | POA: Diagnosis not present

## 2023-03-14 DIAGNOSIS — M2569 Stiffness of other specified joint, not elsewhere classified: Secondary | ICD-10-CM | POA: Diagnosis not present

## 2023-03-14 DIAGNOSIS — M62551 Muscle wasting and atrophy, not elsewhere classified, right thigh: Secondary | ICD-10-CM | POA: Diagnosis not present

## 2023-03-14 DIAGNOSIS — M5416 Radiculopathy, lumbar region: Secondary | ICD-10-CM | POA: Diagnosis not present

## 2023-03-16 DIAGNOSIS — M2569 Stiffness of other specified joint, not elsewhere classified: Secondary | ICD-10-CM | POA: Diagnosis not present

## 2023-03-16 DIAGNOSIS — M5416 Radiculopathy, lumbar region: Secondary | ICD-10-CM | POA: Diagnosis not present

## 2023-03-16 DIAGNOSIS — M62551 Muscle wasting and atrophy, not elsewhere classified, right thigh: Secondary | ICD-10-CM | POA: Diagnosis not present

## 2023-03-16 DIAGNOSIS — M62561 Muscle wasting and atrophy, not elsewhere classified, right lower leg: Secondary | ICD-10-CM | POA: Diagnosis not present

## 2023-03-21 DIAGNOSIS — M62551 Muscle wasting and atrophy, not elsewhere classified, right thigh: Secondary | ICD-10-CM | POA: Diagnosis not present

## 2023-03-21 DIAGNOSIS — M62561 Muscle wasting and atrophy, not elsewhere classified, right lower leg: Secondary | ICD-10-CM | POA: Diagnosis not present

## 2023-03-21 DIAGNOSIS — M2569 Stiffness of other specified joint, not elsewhere classified: Secondary | ICD-10-CM | POA: Diagnosis not present

## 2023-03-21 DIAGNOSIS — M5416 Radiculopathy, lumbar region: Secondary | ICD-10-CM | POA: Diagnosis not present

## 2023-03-24 DIAGNOSIS — M2569 Stiffness of other specified joint, not elsewhere classified: Secondary | ICD-10-CM | POA: Diagnosis not present

## 2023-03-24 DIAGNOSIS — M62561 Muscle wasting and atrophy, not elsewhere classified, right lower leg: Secondary | ICD-10-CM | POA: Diagnosis not present

## 2023-03-24 DIAGNOSIS — M5416 Radiculopathy, lumbar region: Secondary | ICD-10-CM | POA: Diagnosis not present

## 2023-03-24 DIAGNOSIS — M62551 Muscle wasting and atrophy, not elsewhere classified, right thigh: Secondary | ICD-10-CM | POA: Diagnosis not present

## 2023-03-30 DIAGNOSIS — M2569 Stiffness of other specified joint, not elsewhere classified: Secondary | ICD-10-CM | POA: Diagnosis not present

## 2023-03-30 DIAGNOSIS — M5416 Radiculopathy, lumbar region: Secondary | ICD-10-CM | POA: Diagnosis not present

## 2023-03-30 DIAGNOSIS — M62561 Muscle wasting and atrophy, not elsewhere classified, right lower leg: Secondary | ICD-10-CM | POA: Diagnosis not present

## 2023-03-30 DIAGNOSIS — M62551 Muscle wasting and atrophy, not elsewhere classified, right thigh: Secondary | ICD-10-CM | POA: Diagnosis not present

## 2023-04-04 DIAGNOSIS — M62561 Muscle wasting and atrophy, not elsewhere classified, right lower leg: Secondary | ICD-10-CM | POA: Diagnosis not present

## 2023-04-04 DIAGNOSIS — M5416 Radiculopathy, lumbar region: Secondary | ICD-10-CM | POA: Diagnosis not present

## 2023-04-04 DIAGNOSIS — M62551 Muscle wasting and atrophy, not elsewhere classified, right thigh: Secondary | ICD-10-CM | POA: Diagnosis not present

## 2023-04-04 DIAGNOSIS — M2569 Stiffness of other specified joint, not elsewhere classified: Secondary | ICD-10-CM | POA: Diagnosis not present

## 2023-04-05 DIAGNOSIS — M62561 Muscle wasting and atrophy, not elsewhere classified, right lower leg: Secondary | ICD-10-CM | POA: Diagnosis not present

## 2023-04-05 DIAGNOSIS — M5416 Radiculopathy, lumbar region: Secondary | ICD-10-CM | POA: Diagnosis not present

## 2023-04-05 DIAGNOSIS — M62551 Muscle wasting and atrophy, not elsewhere classified, right thigh: Secondary | ICD-10-CM | POA: Diagnosis not present

## 2023-04-05 DIAGNOSIS — M2569 Stiffness of other specified joint, not elsewhere classified: Secondary | ICD-10-CM | POA: Diagnosis not present

## 2023-04-07 DIAGNOSIS — K648 Other hemorrhoids: Secondary | ICD-10-CM | POA: Diagnosis not present

## 2023-04-07 DIAGNOSIS — K5730 Diverticulosis of large intestine without perforation or abscess without bleeding: Secondary | ICD-10-CM | POA: Diagnosis not present

## 2023-04-07 DIAGNOSIS — D123 Benign neoplasm of transverse colon: Secondary | ICD-10-CM | POA: Diagnosis not present

## 2023-04-07 DIAGNOSIS — D122 Benign neoplasm of ascending colon: Secondary | ICD-10-CM | POA: Diagnosis not present

## 2023-04-07 DIAGNOSIS — R195 Other fecal abnormalities: Secondary | ICD-10-CM | POA: Diagnosis not present

## 2023-04-10 ENCOUNTER — Other Ambulatory Visit: Payer: Self-pay | Admitting: Urology

## 2023-04-10 DIAGNOSIS — N3941 Urge incontinence: Secondary | ICD-10-CM

## 2023-04-11 DIAGNOSIS — D123 Benign neoplasm of transverse colon: Secondary | ICD-10-CM | POA: Diagnosis not present

## 2023-04-11 DIAGNOSIS — M2569 Stiffness of other specified joint, not elsewhere classified: Secondary | ICD-10-CM | POA: Diagnosis not present

## 2023-04-11 DIAGNOSIS — D122 Benign neoplasm of ascending colon: Secondary | ICD-10-CM | POA: Diagnosis not present

## 2023-04-11 DIAGNOSIS — M5416 Radiculopathy, lumbar region: Secondary | ICD-10-CM | POA: Diagnosis not present

## 2023-04-11 DIAGNOSIS — M62551 Muscle wasting and atrophy, not elsewhere classified, right thigh: Secondary | ICD-10-CM | POA: Diagnosis not present

## 2023-04-11 DIAGNOSIS — M62561 Muscle wasting and atrophy, not elsewhere classified, right lower leg: Secondary | ICD-10-CM | POA: Diagnosis not present

## 2023-04-25 DIAGNOSIS — M62561 Muscle wasting and atrophy, not elsewhere classified, right lower leg: Secondary | ICD-10-CM | POA: Diagnosis not present

## 2023-04-25 DIAGNOSIS — M62551 Muscle wasting and atrophy, not elsewhere classified, right thigh: Secondary | ICD-10-CM | POA: Diagnosis not present

## 2023-04-25 DIAGNOSIS — M2569 Stiffness of other specified joint, not elsewhere classified: Secondary | ICD-10-CM | POA: Diagnosis not present

## 2023-04-25 DIAGNOSIS — M5416 Radiculopathy, lumbar region: Secondary | ICD-10-CM | POA: Diagnosis not present

## 2023-04-28 DIAGNOSIS — M62561 Muscle wasting and atrophy, not elsewhere classified, right lower leg: Secondary | ICD-10-CM | POA: Diagnosis not present

## 2023-04-28 DIAGNOSIS — M62551 Muscle wasting and atrophy, not elsewhere classified, right thigh: Secondary | ICD-10-CM | POA: Diagnosis not present

## 2023-04-28 DIAGNOSIS — M5416 Radiculopathy, lumbar region: Secondary | ICD-10-CM | POA: Diagnosis not present

## 2023-04-28 DIAGNOSIS — M2569 Stiffness of other specified joint, not elsewhere classified: Secondary | ICD-10-CM | POA: Diagnosis not present

## 2023-05-04 DIAGNOSIS — B351 Tinea unguium: Secondary | ICD-10-CM | POA: Diagnosis not present

## 2023-05-04 DIAGNOSIS — L84 Corns and callosities: Secondary | ICD-10-CM | POA: Diagnosis not present

## 2023-05-04 DIAGNOSIS — M79675 Pain in left toe(s): Secondary | ICD-10-CM | POA: Diagnosis not present

## 2023-05-10 DIAGNOSIS — M2569 Stiffness of other specified joint, not elsewhere classified: Secondary | ICD-10-CM | POA: Diagnosis not present

## 2023-05-10 DIAGNOSIS — M62551 Muscle wasting and atrophy, not elsewhere classified, right thigh: Secondary | ICD-10-CM | POA: Diagnosis not present

## 2023-05-10 DIAGNOSIS — M62561 Muscle wasting and atrophy, not elsewhere classified, right lower leg: Secondary | ICD-10-CM | POA: Diagnosis not present

## 2023-05-10 DIAGNOSIS — M5416 Radiculopathy, lumbar region: Secondary | ICD-10-CM | POA: Diagnosis not present

## 2023-05-12 DIAGNOSIS — M62561 Muscle wasting and atrophy, not elsewhere classified, right lower leg: Secondary | ICD-10-CM | POA: Diagnosis not present

## 2023-05-12 DIAGNOSIS — M2569 Stiffness of other specified joint, not elsewhere classified: Secondary | ICD-10-CM | POA: Diagnosis not present

## 2023-05-12 DIAGNOSIS — M62551 Muscle wasting and atrophy, not elsewhere classified, right thigh: Secondary | ICD-10-CM | POA: Diagnosis not present

## 2023-05-12 DIAGNOSIS — M5416 Radiculopathy, lumbar region: Secondary | ICD-10-CM | POA: Diagnosis not present

## 2023-05-18 DIAGNOSIS — M62551 Muscle wasting and atrophy, not elsewhere classified, right thigh: Secondary | ICD-10-CM | POA: Diagnosis not present

## 2023-05-18 DIAGNOSIS — M5416 Radiculopathy, lumbar region: Secondary | ICD-10-CM | POA: Diagnosis not present

## 2023-05-18 DIAGNOSIS — M62561 Muscle wasting and atrophy, not elsewhere classified, right lower leg: Secondary | ICD-10-CM | POA: Diagnosis not present

## 2023-05-18 DIAGNOSIS — M2569 Stiffness of other specified joint, not elsewhere classified: Secondary | ICD-10-CM | POA: Diagnosis not present

## 2023-05-26 DIAGNOSIS — S46191A Other injury of muscle, fascia and tendon of long head of biceps, right arm, initial encounter: Secondary | ICD-10-CM | POA: Diagnosis not present

## 2023-05-26 DIAGNOSIS — R7309 Other abnormal glucose: Secondary | ICD-10-CM | POA: Diagnosis not present

## 2023-05-26 DIAGNOSIS — M79601 Pain in right arm: Secondary | ICD-10-CM | POA: Diagnosis not present

## 2023-05-31 DIAGNOSIS — M62561 Muscle wasting and atrophy, not elsewhere classified, right lower leg: Secondary | ICD-10-CM | POA: Diagnosis not present

## 2023-05-31 DIAGNOSIS — M62551 Muscle wasting and atrophy, not elsewhere classified, right thigh: Secondary | ICD-10-CM | POA: Diagnosis not present

## 2023-05-31 DIAGNOSIS — M2569 Stiffness of other specified joint, not elsewhere classified: Secondary | ICD-10-CM | POA: Diagnosis not present

## 2023-05-31 DIAGNOSIS — M5416 Radiculopathy, lumbar region: Secondary | ICD-10-CM | POA: Diagnosis not present

## 2023-06-01 DIAGNOSIS — M2569 Stiffness of other specified joint, not elsewhere classified: Secondary | ICD-10-CM | POA: Diagnosis not present

## 2023-06-01 DIAGNOSIS — M5416 Radiculopathy, lumbar region: Secondary | ICD-10-CM | POA: Diagnosis not present

## 2023-06-01 DIAGNOSIS — M62551 Muscle wasting and atrophy, not elsewhere classified, right thigh: Secondary | ICD-10-CM | POA: Diagnosis not present

## 2023-06-01 DIAGNOSIS — M62561 Muscle wasting and atrophy, not elsewhere classified, right lower leg: Secondary | ICD-10-CM | POA: Diagnosis not present

## 2023-06-05 DIAGNOSIS — M7581 Other shoulder lesions, right shoulder: Secondary | ICD-10-CM | POA: Diagnosis not present

## 2023-06-06 DIAGNOSIS — M5416 Radiculopathy, lumbar region: Secondary | ICD-10-CM | POA: Diagnosis not present

## 2023-06-06 DIAGNOSIS — M62561 Muscle wasting and atrophy, not elsewhere classified, right lower leg: Secondary | ICD-10-CM | POA: Diagnosis not present

## 2023-06-06 DIAGNOSIS — M62551 Muscle wasting and atrophy, not elsewhere classified, right thigh: Secondary | ICD-10-CM | POA: Diagnosis not present

## 2023-06-06 DIAGNOSIS — M2569 Stiffness of other specified joint, not elsewhere classified: Secondary | ICD-10-CM | POA: Diagnosis not present

## 2023-06-08 DIAGNOSIS — N3281 Overactive bladder: Secondary | ICD-10-CM | POA: Insufficient documentation

## 2023-06-08 DIAGNOSIS — M62551 Muscle wasting and atrophy, not elsewhere classified, right thigh: Secondary | ICD-10-CM | POA: Diagnosis not present

## 2023-06-08 DIAGNOSIS — N3941 Urge incontinence: Secondary | ICD-10-CM | POA: Insufficient documentation

## 2023-06-08 DIAGNOSIS — M2569 Stiffness of other specified joint, not elsewhere classified: Secondary | ICD-10-CM | POA: Diagnosis not present

## 2023-06-08 DIAGNOSIS — M5416 Radiculopathy, lumbar region: Secondary | ICD-10-CM | POA: Diagnosis not present

## 2023-06-08 DIAGNOSIS — M62561 Muscle wasting and atrophy, not elsewhere classified, right lower leg: Secondary | ICD-10-CM | POA: Diagnosis not present

## 2023-06-08 NOTE — Progress Notes (Signed)
Name: Melissa Rosales DOB: 11-06-1945 MRN: [id number]  History of Present Illness: Melissa Rosales is a 78 y.o. female who presents today for follow up visit at [facility] [place].  - GU history: 1. OAB with urinary frequency, nocturia, urgency, and urge incontinence.  At last visit J. Summerlin, PA on 02/07/2022: - OAB symptoms well managed on Myrbetriq 25 mg daily. - PVR: 0 ml.  Today: She reports that her urinary symptoms are well managed during the day after taking Myrbetriq 25 mg in the morning but that by the evening / overnight the urinary symptoms are worse. She is requesting to take a second dose of Myrbetriq 25 mg around dinner time to improve evening / overnight urinary control.   She denies dysuria, gross hematuria, straining to void, or sensations of incomplete emptying.   Fall Screening: Do you usually have a device to assist in your mobility? Yes - cane   Medications: Current Outpatient Medications  Medication Sig Dispense Refill   amLODipine (NORVASC) 10 MG tablet Take 10 mg by mouth daily.      anastrozole (ARIMIDEX) 1 MG tablet TAKE ONE TABLET BY MOUTH DAILY 90 tablet 3   aspirin EC 81 MG tablet Take 81 mg by mouth daily.     Calcium Carb-Cholecalciferol (CALCIUM + D3 PO) Take 1 tablet by mouth daily.      clotrimazole-betamethasone (LOTRISONE) cream Apply 1 application topically 2 (two) times daily as needed (for irritation).      Cyanocobalamin (VITAMIN B-12 PO) Take 1 tablet by mouth daily.     DULoxetine (CYMBALTA) 60 MG capsule Take 1 capsule (60 mg total) by mouth 2 (two) times daily. 60 capsule 5   enalapril (VASOTEC) 20 MG tablet Take 20 mg by mouth daily.      famotidine (PEPCID) 20 MG tablet Take 20 mg by mouth 2 (two) times daily.     FIBER FORMULA CAPS Take 1 capsule by mouth daily.     gabapentin (NEURONTIN) 600 MG tablet Take 1 tablet (600 mg total) by mouth 4 (four) times daily. Take 1 tablet by mouth 4 times daily 120 tablet 5    hydrochlorothiazide (HYDRODIURIL) 25 MG tablet Take 25 mg by mouth daily.     lactulose (CHRONULAC) 10 GM/15ML solution Take 10-20 g by mouth at bedtime as needed for moderate constipation.      meloxicam (MOBIC) 15 MG tablet Take 15 mg by mouth every morning.     Multiple Vitamins-Minerals (MULTIVITAMIN WITH MINERALS) tablet Take 1 tablet by mouth daily.     Omega 3 1000 MG CAPS Take 1,000 mg by mouth daily.      potassium chloride (K-DUR) 10 MEQ tablet Take 10 mEq by mouth daily.     ranitidine (ZANTAC) 150 MG tablet Take 150 mg by mouth daily.     vitamin C (ASCORBIC ACID) 500 MG tablet Take 500 mg by mouth daily.     mirabegron ER (MYRBETRIQ) 25 MG TB24 tablet Take 1 tablet (25 mg total) by mouth in the morning and at bedtime. 60 tablet 11   No current facility-administered medications for this visit.    Allergies: Allergies  Allergen Reactions   Keflex [Cephalexin] Nausea And Vomiting    Past Medical History:  Diagnosis Date   Acid reflux    Arthritis    Cancer (HCC)    breast   Cervical myelopathy (HCC) 05/17/2017   C4-5   Hypertension    Personal history of radiation therapy  Past Surgical History:  Procedure Laterality Date   ANTERIOR CERVICAL DECOMP/DISCECTOMY FUSION N/A 11/07/2016   Procedure: ANTERIOR CERVICAL DECOMPRESSION/DISCECTOMY FUSION CERVICAL 4- CERVICAL 5;  Surgeon: Shirlean Kelly, MD;  Location: [facility] OR;  Service: Neurosurgery;  Laterality: N/A;  ANTERIOR CERVICAL DECOMPRESSION/DISCECTOMY FUSION CERVICAL 4- CERVICAL 5   BREAST LUMPECTOMY Left 05/2018   BREAST LUMPECTOMY WITH RADIOACTIVE SEED AND SENTINEL LYMPH NODE BIOPSY Left 06/06/2018   Procedure: LEFT BREAST LUMPECTOMY WITH RADIOACTIVE SEED AND AXILLARY DEEP SENTINEL LYMPH NODE BIOPSY WITH BLUE DYE INJECTION;  Surgeon: Claud Kelp, MD;  Location: [facility] OR;  Service: General;  Laterality: Left;   COLONOSCOPY     EYE SURGERY Bilateral    HARDWARE REMOVAL N/A 11/07/2016   Procedure: anterior cervical  disectomy fusion cervical four-five (revision);  Surgeon: Shirlean Kelly, MD;  Location[facility] OR;  Service: Neurosurgery;  Laterality: N/A;   VAGINAL HYSTERECTOMY     vision problems since birth     Family History  Problem Relation Age of Onset   Arthritis Other    Heart disease Mother    Depression Sister    Social History   Socioeconomic History   Marital status: Legally Separated    Spouse name: Not on file   Number of children: 0   Years of education: 10   Highest education level: Not on file  Occupational History   Occupation: Retired  Tobacco Use   Smoking status: Never   Smokeless tobacco: Never  Vaping Use   Vaping status: Never Used  Substance and Sexual Activity   Alcohol use: No   Drug use: No   Sexual activity: Never  Other Topics Concern   Not on file  Social History Narrative   Lives alone. Niece stays with her.   Caffeine use: Drinks coffee- 1 cup per day   Social Drivers of Health   Financial Resource Strain: Unknown (07/03/2018)   Received from [facility], [facility]   Overall Financial Resource Strain (CARDIA)    Difficulty of Paying Living Expenses: Patient declined  Food Insecurity: No Food Insecurity (08/13/2022)   Hunger Vital Sign    Worried About Running Out of Food in the Last Year: Never true    Ran Out of Food in the Last Year: Never true  Transportation Needs: No Transportation Needs (08/13/2022)   PRAPARE - Administrator, Civil Service (Medical): No    Lack of Transportation (Non-Medical): No  Physical Activity: Unknown (07/03/2018)   Received from [facility], [facility]   Exercise Vital Sign    Days of Exercise per Week: Patient declined    Minutes of Exercise per Session: Patient declined  Stress: No Stress Concern Present (07/03/2018)   Received from [facility], [facility] - Occupational Stress Questionnaire    Feeling of Stress : Not at all  Social  Connections: Unknown (07/03/2018)   Received from [facility], [facility]   Social Connection and Isolation Panel [NHANES]    Frequency of Communication with Friends and Family: Patient declined    Frequency of Social Gatherings with Friends and Family: Patient declined    Attends Religious Services: Patient declined    Active Member of Clubs or Organizations: Patient declined    Attends Banker Meetings: Patient declined    Marital Status: Patient declined  Intimate Partner Violence: Not At Risk (08/13/2022)   Humiliation, Afraid, Rape, and Kick questionnaire  Fear of Current or Ex-Partner: No    Emotionally Abused: No    Physically Abused: No    Sexually Abused: No    Review of Systems Constitutional: Patient denies any unintentional weight loss or change in strength lntegumentary: Patient denies any rashes or pruritus Cardiovascular: Patient denies chest pain or syncope Respiratory: Patient denies shortness of breath Gastrointestinal: Patient denies constipation  Musculoskeletal: Patient denies muscle cramps or weakness Neurologic: Patient denies convulsions or seizures Allergic/Immunologic: Patient denies recent allergic reaction(s) Hematologic/Lymphatic: Patient denies bleeding tendencies Endocrine: Patient denies heat/cold intolerance  GU: As per HPI.  OBJECTIVE Vitals:   06/14/23 1348  BP: 119/74  Pulse: 73   There is no height or weight on file to calculate BMI.  Physical Examination Constitutional: No obvious distress; patient is non-toxic appearing  Cardiovascular: No visible lower extremity edema.  Respiratory: The patient does not have audible wheezing/stridor; respirations do not appear labored  Gastrointestinal: Abdomen non-distended Musculoskeletal: Normal ROM of UEs  Skin: No obvious rashes/open sores  Neurologic: CN 2-12 grossly intact Psychiatric: Answered questions appropriately with normal affect   Hematologic/Lymphatic/Immunologic: No obvious bruises or sites of spontaneous bleeding  UA: pending PVR: 0 ml  ASSESSMENT OAB (overactive bladder)  Urge incontinence - Plan: mirabegron ER (MYRBETRIQ) 25 MG TB24 tablet  We agreed to increase Myrbetriq to 25 mg twice daily to improve evening / overnight urinary symptom management. We reviewed risks including incomplete bladder emptying / urinary retention. Will plan for follow up in 6 months or sooner if needed. Pt verbalized understanding and agreement. All questions were answered.   PLAN Advised the following: 1. Myrbetriq 25 mg twice daily. 2. Return in about 6 months (around 12/12/2023) for UA, PVR, & f/u with Evette Georges NP.  No orders of the defined types were placed in this encounter.   It has been explained that the patient is to follow regularly with their PCP in addition to all other providers involved in their care and to follow instructions provided by these respective offices. Patient advised to contact urology clinic if any urologic-pertaining questions, concerns, new symptoms or problems arise in the interim period.  Patient Instructions      Electronically signed by:  Donnita Falls, FNP   06/14/23    2:21 PM

## 2023-06-13 DIAGNOSIS — M6281 Muscle weakness (generalized): Secondary | ICD-10-CM | POA: Diagnosis not present

## 2023-06-13 DIAGNOSIS — M25511 Pain in right shoulder: Secondary | ICD-10-CM | POA: Diagnosis not present

## 2023-06-13 DIAGNOSIS — M25611 Stiffness of right shoulder, not elsewhere classified: Secondary | ICD-10-CM | POA: Diagnosis not present

## 2023-06-14 ENCOUNTER — Ambulatory Visit (INDEPENDENT_AMBULATORY_CARE_PROVIDER_SITE_OTHER): Payer: 59 | Admitting: Urology

## 2023-06-14 ENCOUNTER — Other Ambulatory Visit: Payer: Self-pay

## 2023-06-14 ENCOUNTER — Encounter: Payer: Self-pay | Admitting: Urology

## 2023-06-14 VITALS — BP 119/74 | HR 73

## 2023-06-14 DIAGNOSIS — N3941 Urge incontinence: Secondary | ICD-10-CM | POA: Diagnosis not present

## 2023-06-14 DIAGNOSIS — N3281 Overactive bladder: Secondary | ICD-10-CM

## 2023-06-14 MED ORDER — MIRABEGRON ER 25 MG PO TB24
25.0000 mg | ORAL_TABLET | Freq: Two times a day (BID) | ORAL | 11 refills | Status: AC
Start: 2023-06-14 — End: ?

## 2023-06-14 NOTE — Patient Instructions (Signed)
 Marland Kitchen

## 2023-06-14 NOTE — Progress Notes (Signed)
PVR 0

## 2023-06-15 DIAGNOSIS — M2569 Stiffness of other specified joint, not elsewhere classified: Secondary | ICD-10-CM | POA: Diagnosis not present

## 2023-06-15 DIAGNOSIS — M62551 Muscle wasting and atrophy, not elsewhere classified, right thigh: Secondary | ICD-10-CM | POA: Diagnosis not present

## 2023-06-15 DIAGNOSIS — M5416 Radiculopathy, lumbar region: Secondary | ICD-10-CM | POA: Diagnosis not present

## 2023-06-15 DIAGNOSIS — M62561 Muscle wasting and atrophy, not elsewhere classified, right lower leg: Secondary | ICD-10-CM | POA: Diagnosis not present

## 2023-06-15 LAB — URINALYSIS, ROUTINE W REFLEX MICROSCOPIC
Bilirubin, UA: NEGATIVE
Glucose, UA: NEGATIVE
Nitrite, UA: NEGATIVE
RBC, UA: NEGATIVE
Specific Gravity, UA: 1.025 (ref 1.005–1.030)
Urobilinogen, Ur: 0.2 mg/dL (ref 0.2–1.0)
pH, UA: 6 (ref 5.0–7.5)

## 2023-06-15 LAB — MICROSCOPIC EXAMINATION: Bacteria, UA: NONE SEEN

## 2023-06-20 DIAGNOSIS — M6281 Muscle weakness (generalized): Secondary | ICD-10-CM | POA: Diagnosis not present

## 2023-06-20 DIAGNOSIS — M25511 Pain in right shoulder: Secondary | ICD-10-CM | POA: Diagnosis not present

## 2023-06-20 DIAGNOSIS — M25611 Stiffness of right shoulder, not elsewhere classified: Secondary | ICD-10-CM | POA: Diagnosis not present

## 2023-06-22 DIAGNOSIS — M2569 Stiffness of other specified joint, not elsewhere classified: Secondary | ICD-10-CM | POA: Diagnosis not present

## 2023-06-22 DIAGNOSIS — M5416 Radiculopathy, lumbar region: Secondary | ICD-10-CM | POA: Diagnosis not present

## 2023-06-22 DIAGNOSIS — M62561 Muscle wasting and atrophy, not elsewhere classified, right lower leg: Secondary | ICD-10-CM | POA: Diagnosis not present

## 2023-06-22 DIAGNOSIS — M62551 Muscle wasting and atrophy, not elsewhere classified, right thigh: Secondary | ICD-10-CM | POA: Diagnosis not present

## 2023-06-27 DIAGNOSIS — M5416 Radiculopathy, lumbar region: Secondary | ICD-10-CM | POA: Diagnosis not present

## 2023-06-27 DIAGNOSIS — M62561 Muscle wasting and atrophy, not elsewhere classified, right lower leg: Secondary | ICD-10-CM | POA: Diagnosis not present

## 2023-06-27 DIAGNOSIS — M62551 Muscle wasting and atrophy, not elsewhere classified, right thigh: Secondary | ICD-10-CM | POA: Diagnosis not present

## 2023-06-27 DIAGNOSIS — M2569 Stiffness of other specified joint, not elsewhere classified: Secondary | ICD-10-CM | POA: Diagnosis not present

## 2023-06-29 DIAGNOSIS — M25511 Pain in right shoulder: Secondary | ICD-10-CM | POA: Diagnosis not present

## 2023-06-29 DIAGNOSIS — M25611 Stiffness of right shoulder, not elsewhere classified: Secondary | ICD-10-CM | POA: Diagnosis not present

## 2023-06-29 DIAGNOSIS — M6281 Muscle weakness (generalized): Secondary | ICD-10-CM | POA: Diagnosis not present

## 2023-07-04 DIAGNOSIS — M2569 Stiffness of other specified joint, not elsewhere classified: Secondary | ICD-10-CM | POA: Diagnosis not present

## 2023-07-04 DIAGNOSIS — M62561 Muscle wasting and atrophy, not elsewhere classified, right lower leg: Secondary | ICD-10-CM | POA: Diagnosis not present

## 2023-07-04 DIAGNOSIS — M5416 Radiculopathy, lumbar region: Secondary | ICD-10-CM | POA: Diagnosis not present

## 2023-07-04 DIAGNOSIS — M62551 Muscle wasting and atrophy, not elsewhere classified, right thigh: Secondary | ICD-10-CM | POA: Diagnosis not present

## 2023-07-11 DIAGNOSIS — M5416 Radiculopathy, lumbar region: Secondary | ICD-10-CM | POA: Diagnosis not present

## 2023-07-11 DIAGNOSIS — M2569 Stiffness of other specified joint, not elsewhere classified: Secondary | ICD-10-CM | POA: Diagnosis not present

## 2023-07-11 DIAGNOSIS — M62551 Muscle wasting and atrophy, not elsewhere classified, right thigh: Secondary | ICD-10-CM | POA: Diagnosis not present

## 2023-07-11 DIAGNOSIS — M62561 Muscle wasting and atrophy, not elsewhere classified, right lower leg: Secondary | ICD-10-CM | POA: Diagnosis not present

## 2023-07-17 ENCOUNTER — Telehealth: Payer: Self-pay

## 2023-07-17 NOTE — Telephone Encounter (Signed)
 Pharmacy called to verify Pt was supposed to be taking Myrbetriq 2x daily per NP last office note that is correct

## 2023-07-18 DIAGNOSIS — M25511 Pain in right shoulder: Secondary | ICD-10-CM | POA: Diagnosis not present

## 2023-07-18 DIAGNOSIS — M6281 Muscle weakness (generalized): Secondary | ICD-10-CM | POA: Diagnosis not present

## 2023-07-18 DIAGNOSIS — M25611 Stiffness of right shoulder, not elsewhere classified: Secondary | ICD-10-CM | POA: Diagnosis not present

## 2023-07-20 DIAGNOSIS — M62561 Muscle wasting and atrophy, not elsewhere classified, right lower leg: Secondary | ICD-10-CM | POA: Diagnosis not present

## 2023-07-20 DIAGNOSIS — M62551 Muscle wasting and atrophy, not elsewhere classified, right thigh: Secondary | ICD-10-CM | POA: Diagnosis not present

## 2023-07-20 DIAGNOSIS — M5416 Radiculopathy, lumbar region: Secondary | ICD-10-CM | POA: Diagnosis not present

## 2023-07-20 DIAGNOSIS — M2569 Stiffness of other specified joint, not elsewhere classified: Secondary | ICD-10-CM | POA: Diagnosis not present

## 2023-07-25 DIAGNOSIS — M2569 Stiffness of other specified joint, not elsewhere classified: Secondary | ICD-10-CM | POA: Diagnosis not present

## 2023-07-25 DIAGNOSIS — M5416 Radiculopathy, lumbar region: Secondary | ICD-10-CM | POA: Diagnosis not present

## 2023-07-25 DIAGNOSIS — M62561 Muscle wasting and atrophy, not elsewhere classified, right lower leg: Secondary | ICD-10-CM | POA: Diagnosis not present

## 2023-07-25 DIAGNOSIS — M62551 Muscle wasting and atrophy, not elsewhere classified, right thigh: Secondary | ICD-10-CM | POA: Diagnosis not present

## 2023-07-27 DIAGNOSIS — M6281 Muscle weakness (generalized): Secondary | ICD-10-CM | POA: Diagnosis not present

## 2023-07-27 DIAGNOSIS — M25511 Pain in right shoulder: Secondary | ICD-10-CM | POA: Diagnosis not present

## 2023-07-27 DIAGNOSIS — M25611 Stiffness of right shoulder, not elsewhere classified: Secondary | ICD-10-CM | POA: Diagnosis not present

## 2023-08-01 DIAGNOSIS — M25611 Stiffness of right shoulder, not elsewhere classified: Secondary | ICD-10-CM | POA: Diagnosis not present

## 2023-08-01 DIAGNOSIS — M6281 Muscle weakness (generalized): Secondary | ICD-10-CM | POA: Diagnosis not present

## 2023-08-01 DIAGNOSIS — M25511 Pain in right shoulder: Secondary | ICD-10-CM | POA: Diagnosis not present

## 2023-08-02 DIAGNOSIS — M62561 Muscle wasting and atrophy, not elsewhere classified, right lower leg: Secondary | ICD-10-CM | POA: Diagnosis not present

## 2023-08-02 DIAGNOSIS — M62551 Muscle wasting and atrophy, not elsewhere classified, right thigh: Secondary | ICD-10-CM | POA: Diagnosis not present

## 2023-08-02 DIAGNOSIS — M2569 Stiffness of other specified joint, not elsewhere classified: Secondary | ICD-10-CM | POA: Diagnosis not present

## 2023-08-02 DIAGNOSIS — M5416 Radiculopathy, lumbar region: Secondary | ICD-10-CM | POA: Diagnosis not present

## 2023-08-03 DIAGNOSIS — L853 Xerosis cutis: Secondary | ICD-10-CM | POA: Diagnosis not present

## 2023-08-03 DIAGNOSIS — L84 Corns and callosities: Secondary | ICD-10-CM | POA: Diagnosis not present

## 2023-08-03 DIAGNOSIS — M79675 Pain in left toe(s): Secondary | ICD-10-CM | POA: Diagnosis not present

## 2023-08-03 DIAGNOSIS — B351 Tinea unguium: Secondary | ICD-10-CM | POA: Diagnosis not present

## 2023-08-07 DIAGNOSIS — M25611 Stiffness of right shoulder, not elsewhere classified: Secondary | ICD-10-CM | POA: Diagnosis not present

## 2023-08-07 DIAGNOSIS — M25511 Pain in right shoulder: Secondary | ICD-10-CM | POA: Diagnosis not present

## 2023-08-07 DIAGNOSIS — M6281 Muscle weakness (generalized): Secondary | ICD-10-CM | POA: Diagnosis not present

## 2023-08-09 DIAGNOSIS — M2569 Stiffness of other specified joint, not elsewhere classified: Secondary | ICD-10-CM | POA: Diagnosis not present

## 2023-08-09 DIAGNOSIS — M5416 Radiculopathy, lumbar region: Secondary | ICD-10-CM | POA: Diagnosis not present

## 2023-08-09 DIAGNOSIS — M62561 Muscle wasting and atrophy, not elsewhere classified, right lower leg: Secondary | ICD-10-CM | POA: Diagnosis not present

## 2023-08-09 DIAGNOSIS — M62551 Muscle wasting and atrophy, not elsewhere classified, right thigh: Secondary | ICD-10-CM | POA: Diagnosis not present

## 2023-08-15 DIAGNOSIS — M6281 Muscle weakness (generalized): Secondary | ICD-10-CM | POA: Diagnosis not present

## 2023-08-15 DIAGNOSIS — M25511 Pain in right shoulder: Secondary | ICD-10-CM | POA: Diagnosis not present

## 2023-08-15 DIAGNOSIS — M25611 Stiffness of right shoulder, not elsewhere classified: Secondary | ICD-10-CM | POA: Diagnosis not present

## 2023-08-17 DIAGNOSIS — M62551 Muscle wasting and atrophy, not elsewhere classified, right thigh: Secondary | ICD-10-CM | POA: Diagnosis not present

## 2023-08-17 DIAGNOSIS — M5416 Radiculopathy, lumbar region: Secondary | ICD-10-CM | POA: Diagnosis not present

## 2023-08-17 DIAGNOSIS — M2569 Stiffness of other specified joint, not elsewhere classified: Secondary | ICD-10-CM | POA: Diagnosis not present

## 2023-08-17 DIAGNOSIS — M62561 Muscle wasting and atrophy, not elsewhere classified, right lower leg: Secondary | ICD-10-CM | POA: Diagnosis not present

## 2023-08-23 DIAGNOSIS — M5416 Radiculopathy, lumbar region: Secondary | ICD-10-CM | POA: Diagnosis not present

## 2023-08-23 DIAGNOSIS — M2569 Stiffness of other specified joint, not elsewhere classified: Secondary | ICD-10-CM | POA: Diagnosis not present

## 2023-08-23 DIAGNOSIS — M62561 Muscle wasting and atrophy, not elsewhere classified, right lower leg: Secondary | ICD-10-CM | POA: Diagnosis not present

## 2023-08-23 DIAGNOSIS — M62551 Muscle wasting and atrophy, not elsewhere classified, right thigh: Secondary | ICD-10-CM | POA: Diagnosis not present

## 2023-08-24 DIAGNOSIS — M25611 Stiffness of right shoulder, not elsewhere classified: Secondary | ICD-10-CM | POA: Diagnosis not present

## 2023-08-24 DIAGNOSIS — M6281 Muscle weakness (generalized): Secondary | ICD-10-CM | POA: Diagnosis not present

## 2023-08-24 DIAGNOSIS — M25511 Pain in right shoulder: Secondary | ICD-10-CM | POA: Diagnosis not present

## 2023-08-29 DIAGNOSIS — M5416 Radiculopathy, lumbar region: Secondary | ICD-10-CM | POA: Diagnosis not present

## 2023-08-29 DIAGNOSIS — M62551 Muscle wasting and atrophy, not elsewhere classified, right thigh: Secondary | ICD-10-CM | POA: Diagnosis not present

## 2023-08-29 DIAGNOSIS — M62561 Muscle wasting and atrophy, not elsewhere classified, right lower leg: Secondary | ICD-10-CM | POA: Diagnosis not present

## 2023-08-29 DIAGNOSIS — M2569 Stiffness of other specified joint, not elsewhere classified: Secondary | ICD-10-CM | POA: Diagnosis not present

## 2023-08-30 ENCOUNTER — Inpatient Hospital Stay: Payer: 59 | Admitting: Hematology and Oncology

## 2023-08-30 NOTE — Assessment & Plan Note (Deleted)
 06/06/2018:Left lumpectomy: Mucinous adenocarcinoma grade 1, 1.8 cm, DCIS, intermediate grade, negative for LV I or PNI, margins negative, DCIS focal less than 1 mm anterior margin, 0/4 lymph nodes negative, ER 90%, PR 0%, HER-2 negative, Ki-67 10%, T1CN0 stage Ia   Adjuvant radiation therapy at [facility]   Current treatment: Adjuvant antiestrogen therapy with anastrozole 1 mg daily x5 years started May 2020   Anastrozole toxicities: Tolerating anastrozole extremely well. Occasional hot flashes and occasional muscle aches and pains.   Breast cancer surveillance: mammogram 06/27/2022: Benign breast density category B.  Encouraged her to undergo a mammogram this year. Breast exam 08/30/2023: Benign   Legally blind. Return to clinic in 1 year for follow-up

## 2023-08-31 DIAGNOSIS — M25611 Stiffness of right shoulder, not elsewhere classified: Secondary | ICD-10-CM | POA: Diagnosis not present

## 2023-08-31 DIAGNOSIS — M6281 Muscle weakness (generalized): Secondary | ICD-10-CM | POA: Diagnosis not present

## 2023-08-31 DIAGNOSIS — M25511 Pain in right shoulder: Secondary | ICD-10-CM | POA: Diagnosis not present

## 2023-09-04 DIAGNOSIS — M19011 Primary osteoarthritis, right shoulder: Secondary | ICD-10-CM | POA: Diagnosis not present

## 2023-09-04 DIAGNOSIS — M7581 Other shoulder lesions, right shoulder: Secondary | ICD-10-CM | POA: Diagnosis not present

## 2023-09-05 ENCOUNTER — Telehealth: Payer: Self-pay | Admitting: Hematology and Oncology

## 2023-09-05 ENCOUNTER — Other Ambulatory Visit: Payer: Self-pay | Admitting: Hematology and Oncology

## 2023-09-05 NOTE — Telephone Encounter (Signed)
 Left vm for pt for scheduled appt time and date

## 2023-09-06 DIAGNOSIS — M2569 Stiffness of other specified joint, not elsewhere classified: Secondary | ICD-10-CM | POA: Diagnosis not present

## 2023-09-06 DIAGNOSIS — M62551 Muscle wasting and atrophy, not elsewhere classified, right thigh: Secondary | ICD-10-CM | POA: Diagnosis not present

## 2023-09-06 DIAGNOSIS — M5416 Radiculopathy, lumbar region: Secondary | ICD-10-CM | POA: Diagnosis not present

## 2023-09-06 DIAGNOSIS — M62561 Muscle wasting and atrophy, not elsewhere classified, right lower leg: Secondary | ICD-10-CM | POA: Diagnosis not present

## 2023-09-07 DIAGNOSIS — M6281 Muscle weakness (generalized): Secondary | ICD-10-CM | POA: Diagnosis not present

## 2023-09-07 DIAGNOSIS — M25611 Stiffness of right shoulder, not elsewhere classified: Secondary | ICD-10-CM | POA: Diagnosis not present

## 2023-09-07 DIAGNOSIS — M25511 Pain in right shoulder: Secondary | ICD-10-CM | POA: Diagnosis not present

## 2023-09-12 DIAGNOSIS — M62561 Muscle wasting and atrophy, not elsewhere classified, right lower leg: Secondary | ICD-10-CM | POA: Diagnosis not present

## 2023-09-12 DIAGNOSIS — M62551 Muscle wasting and atrophy, not elsewhere classified, right thigh: Secondary | ICD-10-CM | POA: Diagnosis not present

## 2023-09-12 DIAGNOSIS — M5416 Radiculopathy, lumbar region: Secondary | ICD-10-CM | POA: Diagnosis not present

## 2023-09-12 DIAGNOSIS — M2569 Stiffness of other specified joint, not elsewhere classified: Secondary | ICD-10-CM | POA: Diagnosis not present

## 2023-09-14 DIAGNOSIS — M6281 Muscle weakness (generalized): Secondary | ICD-10-CM | POA: Diagnosis not present

## 2023-09-14 DIAGNOSIS — M25511 Pain in right shoulder: Secondary | ICD-10-CM | POA: Diagnosis not present

## 2023-09-14 DIAGNOSIS — M25611 Stiffness of right shoulder, not elsewhere classified: Secondary | ICD-10-CM | POA: Diagnosis not present

## 2023-09-19 DIAGNOSIS — M62551 Muscle wasting and atrophy, not elsewhere classified, right thigh: Secondary | ICD-10-CM | POA: Diagnosis not present

## 2023-09-19 DIAGNOSIS — M2569 Stiffness of other specified joint, not elsewhere classified: Secondary | ICD-10-CM | POA: Diagnosis not present

## 2023-09-19 DIAGNOSIS — M5416 Radiculopathy, lumbar region: Secondary | ICD-10-CM | POA: Diagnosis not present

## 2023-09-19 DIAGNOSIS — M62561 Muscle wasting and atrophy, not elsewhere classified, right lower leg: Secondary | ICD-10-CM | POA: Diagnosis not present

## 2023-09-20 DIAGNOSIS — H905 Unspecified sensorineural hearing loss: Secondary | ICD-10-CM | POA: Diagnosis not present

## 2023-09-21 DIAGNOSIS — M25511 Pain in right shoulder: Secondary | ICD-10-CM | POA: Diagnosis not present

## 2023-09-21 DIAGNOSIS — M6281 Muscle weakness (generalized): Secondary | ICD-10-CM | POA: Diagnosis not present

## 2023-09-21 DIAGNOSIS — M25611 Stiffness of right shoulder, not elsewhere classified: Secondary | ICD-10-CM | POA: Diagnosis not present

## 2023-09-26 DIAGNOSIS — M6281 Muscle weakness (generalized): Secondary | ICD-10-CM | POA: Diagnosis not present

## 2023-09-26 DIAGNOSIS — M25611 Stiffness of right shoulder, not elsewhere classified: Secondary | ICD-10-CM | POA: Diagnosis not present

## 2023-09-26 DIAGNOSIS — M25511 Pain in right shoulder: Secondary | ICD-10-CM | POA: Diagnosis not present

## 2023-09-27 DIAGNOSIS — M25611 Stiffness of right shoulder, not elsewhere classified: Secondary | ICD-10-CM | POA: Diagnosis not present

## 2023-09-27 DIAGNOSIS — M25511 Pain in right shoulder: Secondary | ICD-10-CM | POA: Diagnosis not present

## 2023-09-27 DIAGNOSIS — M6281 Muscle weakness (generalized): Secondary | ICD-10-CM | POA: Diagnosis not present

## 2023-10-03 DIAGNOSIS — M6281 Muscle weakness (generalized): Secondary | ICD-10-CM | POA: Diagnosis not present

## 2023-10-03 DIAGNOSIS — M25611 Stiffness of right shoulder, not elsewhere classified: Secondary | ICD-10-CM | POA: Diagnosis not present

## 2023-10-03 DIAGNOSIS — M25511 Pain in right shoulder: Secondary | ICD-10-CM | POA: Diagnosis not present

## 2023-10-04 NOTE — Assessment & Plan Note (Deleted)
 06/06/2018:Left lumpectomy: Mucinous adenocarcinoma grade 1, 1.8 cm, DCIS, intermediate grade, negative for LV I or PNI, margins negative, DCIS focal less than 1 mm anterior margin, 0/4 lymph nodes negative, ER 90%, PR 0%, HER-2 negative, Ki-67 10%, T1CN0 stage Ia   Adjuvant radiation therapy at [facility]   Current treatment: Adjuvant antiestrogen therapy with anastrozole  1 mg daily x5 years started May 2020   Anastrozole  toxicities: Tolerating anastrozole  extremely well. Occasional hot flashes and occasional muscle aches and pains.   Breast cancer surveillance: mammogram 06/27/2022: Benign breast density category B Breast exam 10/05/2023: Benign   Legally blind. Return to clinic in 1 year for follow-up

## 2023-10-05 ENCOUNTER — Other Ambulatory Visit: Payer: Self-pay | Admitting: Adult Health

## 2023-10-05 ENCOUNTER — Inpatient Hospital Stay: Attending: Hematology and Oncology | Admitting: Hematology and Oncology

## 2023-10-05 DIAGNOSIS — Z1231 Encounter for screening mammogram for malignant neoplasm of breast: Secondary | ICD-10-CM

## 2023-10-05 DIAGNOSIS — C50412 Malignant neoplasm of upper-outer quadrant of left female breast: Secondary | ICD-10-CM

## 2023-10-05 DIAGNOSIS — M6281 Muscle weakness (generalized): Secondary | ICD-10-CM | POA: Diagnosis not present

## 2023-10-05 DIAGNOSIS — M25611 Stiffness of right shoulder, not elsewhere classified: Secondary | ICD-10-CM | POA: Diagnosis not present

## 2023-10-05 DIAGNOSIS — M25511 Pain in right shoulder: Secondary | ICD-10-CM | POA: Diagnosis not present

## 2023-10-10 DIAGNOSIS — M25511 Pain in right shoulder: Secondary | ICD-10-CM | POA: Diagnosis not present

## 2023-10-10 DIAGNOSIS — M6281 Muscle weakness (generalized): Secondary | ICD-10-CM | POA: Diagnosis not present

## 2023-10-10 DIAGNOSIS — M25611 Stiffness of right shoulder, not elsewhere classified: Secondary | ICD-10-CM | POA: Diagnosis not present

## 2023-10-11 ENCOUNTER — Ambulatory Visit

## 2023-10-12 DIAGNOSIS — M25611 Stiffness of right shoulder, not elsewhere classified: Secondary | ICD-10-CM | POA: Diagnosis not present

## 2023-10-12 DIAGNOSIS — M6281 Muscle weakness (generalized): Secondary | ICD-10-CM | POA: Diagnosis not present

## 2023-10-12 DIAGNOSIS — M25511 Pain in right shoulder: Secondary | ICD-10-CM | POA: Diagnosis not present

## 2023-10-17 DIAGNOSIS — M25511 Pain in right shoulder: Secondary | ICD-10-CM | POA: Diagnosis not present

## 2023-10-17 DIAGNOSIS — M6281 Muscle weakness (generalized): Secondary | ICD-10-CM | POA: Diagnosis not present

## 2023-10-17 DIAGNOSIS — M25611 Stiffness of right shoulder, not elsewhere classified: Secondary | ICD-10-CM | POA: Diagnosis not present

## 2023-10-18 ENCOUNTER — Ambulatory Visit

## 2023-10-19 DIAGNOSIS — M6281 Muscle weakness (generalized): Secondary | ICD-10-CM | POA: Diagnosis not present

## 2023-10-19 DIAGNOSIS — M25611 Stiffness of right shoulder, not elsewhere classified: Secondary | ICD-10-CM | POA: Diagnosis not present

## 2023-10-19 DIAGNOSIS — M25511 Pain in right shoulder: Secondary | ICD-10-CM | POA: Diagnosis not present

## 2023-10-20 ENCOUNTER — Ambulatory Visit

## 2023-10-24 DIAGNOSIS — M25611 Stiffness of right shoulder, not elsewhere classified: Secondary | ICD-10-CM | POA: Diagnosis not present

## 2023-10-24 DIAGNOSIS — M25511 Pain in right shoulder: Secondary | ICD-10-CM | POA: Diagnosis not present

## 2023-10-24 DIAGNOSIS — M6281 Muscle weakness (generalized): Secondary | ICD-10-CM | POA: Diagnosis not present

## 2023-10-25 ENCOUNTER — Ambulatory Visit
Admission: RE | Admit: 2023-10-25 | Discharge: 2023-10-25 | Disposition: A | Discharge status: Home / Self Care | Source: Ambulatory Visit | Attending: Adult Health | Admitting: Adult Health

## 2023-10-25 DIAGNOSIS — Z1231 Encounter for screening mammogram for malignant neoplasm of breast: Secondary | ICD-10-CM | POA: Diagnosis not present

## 2023-10-26 DIAGNOSIS — M25611 Stiffness of right shoulder, not elsewhere classified: Secondary | ICD-10-CM | POA: Diagnosis not present

## 2023-10-26 DIAGNOSIS — M6281 Muscle weakness (generalized): Secondary | ICD-10-CM | POA: Diagnosis not present

## 2023-10-26 DIAGNOSIS — M25511 Pain in right shoulder: Secondary | ICD-10-CM | POA: Diagnosis not present

## 2023-10-31 DIAGNOSIS — M25511 Pain in right shoulder: Secondary | ICD-10-CM | POA: Diagnosis not present

## 2023-10-31 DIAGNOSIS — M25611 Stiffness of right shoulder, not elsewhere classified: Secondary | ICD-10-CM | POA: Diagnosis not present

## 2023-10-31 DIAGNOSIS — M6281 Muscle weakness (generalized): Secondary | ICD-10-CM | POA: Diagnosis not present

## 2023-11-02 DIAGNOSIS — M25611 Stiffness of right shoulder, not elsewhere classified: Secondary | ICD-10-CM | POA: Diagnosis not present

## 2023-11-02 DIAGNOSIS — M6281 Muscle weakness (generalized): Secondary | ICD-10-CM | POA: Diagnosis not present

## 2023-11-02 DIAGNOSIS — M25511 Pain in right shoulder: Secondary | ICD-10-CM | POA: Diagnosis not present

## 2023-11-06 DIAGNOSIS — L84 Corns and callosities: Secondary | ICD-10-CM | POA: Diagnosis not present

## 2023-11-06 DIAGNOSIS — M79675 Pain in left toe(s): Secondary | ICD-10-CM | POA: Diagnosis not present

## 2023-11-06 DIAGNOSIS — B351 Tinea unguium: Secondary | ICD-10-CM | POA: Diagnosis not present

## 2023-11-06 DIAGNOSIS — E119 Type 2 diabetes mellitus without complications: Secondary | ICD-10-CM | POA: Diagnosis not present

## 2023-11-07 DIAGNOSIS — M25611 Stiffness of right shoulder, not elsewhere classified: Secondary | ICD-10-CM | POA: Diagnosis not present

## 2023-11-07 DIAGNOSIS — M25511 Pain in right shoulder: Secondary | ICD-10-CM | POA: Diagnosis not present

## 2023-11-07 DIAGNOSIS — M6281 Muscle weakness (generalized): Secondary | ICD-10-CM | POA: Diagnosis not present

## 2023-11-14 DIAGNOSIS — M6281 Muscle weakness (generalized): Secondary | ICD-10-CM | POA: Diagnosis not present

## 2023-11-14 DIAGNOSIS — M25611 Stiffness of right shoulder, not elsewhere classified: Secondary | ICD-10-CM | POA: Diagnosis not present

## 2023-11-14 DIAGNOSIS — M25511 Pain in right shoulder: Secondary | ICD-10-CM | POA: Diagnosis not present

## 2023-11-16 DIAGNOSIS — M25611 Stiffness of right shoulder, not elsewhere classified: Secondary | ICD-10-CM | POA: Diagnosis not present

## 2023-11-16 DIAGNOSIS — M6281 Muscle weakness (generalized): Secondary | ICD-10-CM | POA: Diagnosis not present

## 2023-11-16 DIAGNOSIS — M25511 Pain in right shoulder: Secondary | ICD-10-CM | POA: Diagnosis not present

## 2023-11-21 DIAGNOSIS — M25611 Stiffness of right shoulder, not elsewhere classified: Secondary | ICD-10-CM | POA: Diagnosis not present

## 2023-11-21 DIAGNOSIS — M25511 Pain in right shoulder: Secondary | ICD-10-CM | POA: Diagnosis not present

## 2023-11-21 DIAGNOSIS — M6281 Muscle weakness (generalized): Secondary | ICD-10-CM | POA: Diagnosis not present

## 2023-11-22 DIAGNOSIS — M25611 Stiffness of right shoulder, not elsewhere classified: Secondary | ICD-10-CM | POA: Diagnosis not present

## 2023-11-22 DIAGNOSIS — M6281 Muscle weakness (generalized): Secondary | ICD-10-CM | POA: Diagnosis not present

## 2023-11-22 DIAGNOSIS — M25511 Pain in right shoulder: Secondary | ICD-10-CM | POA: Diagnosis not present

## 2023-11-28 DIAGNOSIS — M6281 Muscle weakness (generalized): Secondary | ICD-10-CM | POA: Diagnosis not present

## 2023-11-28 DIAGNOSIS — M25611 Stiffness of right shoulder, not elsewhere classified: Secondary | ICD-10-CM | POA: Diagnosis not present

## 2023-11-28 DIAGNOSIS — M25511 Pain in right shoulder: Secondary | ICD-10-CM | POA: Diagnosis not present

## 2023-11-30 DIAGNOSIS — M25611 Stiffness of right shoulder, not elsewhere classified: Secondary | ICD-10-CM | POA: Diagnosis not present

## 2023-11-30 DIAGNOSIS — M25511 Pain in right shoulder: Secondary | ICD-10-CM | POA: Diagnosis not present

## 2023-11-30 DIAGNOSIS — M6281 Muscle weakness (generalized): Secondary | ICD-10-CM | POA: Diagnosis not present

## 2023-12-01 DIAGNOSIS — H548 Legal blindness, as defined in USA: Secondary | ICD-10-CM | POA: Diagnosis not present

## 2023-12-01 DIAGNOSIS — G603 Idiopathic progressive neuropathy: Secondary | ICD-10-CM | POA: Diagnosis not present

## 2023-12-01 DIAGNOSIS — Z0001 Encounter for general adult medical examination with abnormal findings: Secondary | ICD-10-CM | POA: Diagnosis not present

## 2023-12-01 DIAGNOSIS — Z13220 Encounter for screening for lipoid disorders: Secondary | ICD-10-CM | POA: Diagnosis not present

## 2023-12-01 DIAGNOSIS — R7309 Other abnormal glucose: Secondary | ICD-10-CM | POA: Diagnosis not present

## 2023-12-01 DIAGNOSIS — E782 Mixed hyperlipidemia: Secondary | ICD-10-CM | POA: Diagnosis not present

## 2023-12-01 DIAGNOSIS — N183 Chronic kidney disease, stage 3 unspecified: Secondary | ICD-10-CM | POA: Diagnosis not present

## 2023-12-01 DIAGNOSIS — M5441 Lumbago with sciatica, right side: Secondary | ICD-10-CM | POA: Diagnosis not present

## 2023-12-05 DIAGNOSIS — M6281 Muscle weakness (generalized): Secondary | ICD-10-CM | POA: Diagnosis not present

## 2023-12-05 DIAGNOSIS — M25511 Pain in right shoulder: Secondary | ICD-10-CM | POA: Diagnosis not present

## 2023-12-05 DIAGNOSIS — M25611 Stiffness of right shoulder, not elsewhere classified: Secondary | ICD-10-CM | POA: Diagnosis not present

## 2023-12-07 DIAGNOSIS — M25511 Pain in right shoulder: Secondary | ICD-10-CM | POA: Diagnosis not present

## 2023-12-07 DIAGNOSIS — M25611 Stiffness of right shoulder, not elsewhere classified: Secondary | ICD-10-CM | POA: Diagnosis not present

## 2023-12-07 DIAGNOSIS — E119 Type 2 diabetes mellitus without complications: Secondary | ICD-10-CM | POA: Diagnosis not present

## 2023-12-07 DIAGNOSIS — M6281 Muscle weakness (generalized): Secondary | ICD-10-CM | POA: Diagnosis not present

## 2023-12-12 ENCOUNTER — Ambulatory Visit: Payer: 59 | Admitting: Urology

## 2023-12-12 DIAGNOSIS — M25611 Stiffness of right shoulder, not elsewhere classified: Secondary | ICD-10-CM | POA: Diagnosis not present

## 2023-12-12 DIAGNOSIS — M6281 Muscle weakness (generalized): Secondary | ICD-10-CM | POA: Diagnosis not present

## 2023-12-12 DIAGNOSIS — M25511 Pain in right shoulder: Secondary | ICD-10-CM | POA: Diagnosis not present

## 2023-12-14 ENCOUNTER — Telehealth: Payer: Self-pay | Admitting: Hematology and Oncology

## 2023-12-14 ENCOUNTER — Other Ambulatory Visit: Payer: Self-pay | Admitting: Hematology and Oncology

## 2023-12-14 DIAGNOSIS — M6281 Muscle weakness (generalized): Secondary | ICD-10-CM | POA: Diagnosis not present

## 2023-12-14 DIAGNOSIS — M25511 Pain in right shoulder: Secondary | ICD-10-CM | POA: Diagnosis not present

## 2023-12-14 DIAGNOSIS — M25611 Stiffness of right shoulder, not elsewhere classified: Secondary | ICD-10-CM | POA: Diagnosis not present

## 2023-12-14 NOTE — Telephone Encounter (Signed)
 left vm for pt about scheduled appt date and time

## 2023-12-15 NOTE — Progress Notes (Deleted)
 Name: Melissa Rosales DOB: [birth year] MRN: [id number]  History of Present Illness: [name] is a 78 y.o. female who presents today for follow up visit at [facility] [place].  Relevant History includes: 1. OAB with urinary frequency, nocturia, urgency, and urge incontinence.  At last visit on 06/14/2023: - Reported that her urinary symptoms are well managed during the day after taking Myrbetriq  25 mg in the morning but that by the evening / overnight the urinary symptoms are worse. She is requesting to take a second dose of Myrbetriq  25 mg around dinner time to improve evening / overnight urinary control. - The plan was: increase Myrbetriq  to 25 mg twice daily.  Since last visit: ***  Today: She reports {Blank multiple:19197::improved,persistent / unchanged} urinary ***frequency, ***nocturia x***, ***urgency, and ***urge incontinence.  Voiding ***x/day and ***x/night on average.  Leaking ***x/day on average; using *** ***pads / ***diapers per day on average.  She {Actions; denies-reports:120008} significant caffeine intake (*** caffeinated beverages per day on average).  She {Actions; denies-reports:120008} dysuria, gross hematuria, straining to void, or sensations of incomplete emptying.  Medications: Current Outpatient Medications  Medication Sig Dispense Refill   amLODipine  (NORVASC ) 10 MG tablet Take 10 mg by mouth daily.      anastrozole  (ARIMIDEX ) 1 MG tablet TAKE ONE TABLET BY MOUTH DAILY 90 tablet 0   aspirin EC 81 MG tablet Take 81 mg by mouth daily.     Calcium Carb-Cholecalciferol (CALCIUM + D3 PO) Take 1 tablet by mouth daily.      clotrimazole-betamethasone  (LOTRISONE) cream Apply 1 application topically 2 (two) times daily as needed (for irritation).      Cyanocobalamin (VITAMIN B-12 PO) Take 1 tablet by mouth daily.     DULoxetine  (CYMBALTA ) 60 MG capsule Take 1 capsule (60 mg total) by mouth 2 (two) times daily. 60 capsule 5   enalapril   (VASOTEC ) 20 MG tablet Take 20 mg by mouth daily.      famotidine  (PEPCID ) 20 MG tablet Take 20 mg by mouth 2 (two) times daily.     FIBER FORMULA CAPS Take 1 capsule by mouth daily.     gabapentin  (NEURONTIN ) 600 MG tablet Take 1 tablet (600 mg total) by mouth 4 (four) times daily. Take 1 tablet by mouth 4 times daily 120 tablet 5   hydrochlorothiazide  (HYDRODIURIL ) 25 MG tablet Take 25 mg by mouth daily.     lactulose (CHRONULAC) 10 GM/15ML solution Take 10-20 g by mouth at bedtime as needed for moderate constipation.      meloxicam (MOBIC) 15 MG tablet Take 15 mg by mouth every morning.     mirabegron  ER (MYRBETRIQ ) 25 MG TB24 tablet Take 1 tablet (25 mg total) by mouth in the morning and at bedtime. 60 tablet 11   Multiple Vitamins-Minerals (MULTIVITAMIN WITH MINERALS) tablet Take 1 tablet by mouth daily.     Omega 3 1000 MG CAPS Take 1,000 mg by mouth daily.      potassium chloride  (K-DUR) 10 MEQ tablet Take 10 mEq by mouth daily.     ranitidine (ZANTAC) 150 MG tablet Take 150 mg by mouth daily.     vitamin C  (ASCORBIC ACID ) 500 MG tablet Take 500 mg by mouth daily.     No current facility-administered medications for this visit.    Allergies: Allergies  Allergen Reactions   Keflex [Cephalexin] Nausea And Vomiting    Past Medical History:  Diagnosis Date   Acid reflux    Arthritis  Cancer [facility])    breast   Cervical myelopathy (HCC) 05/17/2017   C4-5   Hypertension    Personal history of radiation therapy    Past Surgical History:  Procedure Laterality Date   ANTERIOR CERVICAL DECOMP/DISCECTOMY FUSION N/A 11/07/2016   Procedure: ANTERIOR CERVICAL DECOMPRESSION/DISCECTOMY FUSION CERVICAL 4- CERVICAL 5;  Surgeon: Alix Charleston, MD;  Location: [facility] OR;  Service: Neurosurgery;  Laterality: N/A;  ANTERIOR CERVICAL DECOMPRESSION/DISCECTOMY FUSION CERVICAL 4- CERVICAL 5   BREAST LUMPECTOMY Left 05/2018   BREAST LUMPECTOMY WITH RADIOACTIVE SEED AND SENTINEL LYMPH NODE BIOPSY Left  06/06/2018   Procedure: LEFT BREAST LUMPECTOMY WITH RADIOACTIVE SEED AND AXILLARY DEEP SENTINEL LYMPH NODE BIOPSY WITH BLUE DYE INJECTION;  Surgeon: Gail Favorite, MD;  Location: [facility] OR;  Service: General;  Laterality: Left;   COLONOSCOPY     EYE SURGERY Bilateral    HARDWARE REMOVAL N/A 11/07/2016   Procedure: anterior cervical disectomy fusion cervical four-five (revision);  Surgeon: Alix Charleston, MD;  Location: [facility] OR;  Service: Neurosurgery;  Laterality: N/A;   VAGINAL HYSTERECTOMY     vision problems since birth     Family History  Problem Relation Age of Onset   Arthritis Other    Heart disease Mother    Depression Sister    Social History   Socioeconomic History   Marital status: Legally Separated    Spouse name: Not on file   Number of children: 0   Years of education: 10   Highest education level: Not on file  Occupational History   Occupation: Retired  Tobacco Use   Smoking status: Never   Smokeless tobacco: Never  Vaping Use   Vaping status: Never Used  Substance and Sexual Activity   Alcohol use: No   Drug use: No   Sexual activity: Never  Other Topics Concern   Not on file  Social History Narrative   Lives alone. Niece stays with her.   Caffeine use: Drinks coffee- 1 cup per day   Social Drivers of Health   Financial Resource Strain: Unknown (07/03/2018)   Received from [facility]   Overall Financial Resource Strain (CARDIA)    Difficulty of Paying Living Expenses: Patient declined  Food Insecurity: No Food Insecurity (08/13/2022)   Hunger Vital Sign    Worried About Running Out of Food in the Last Year: Never true    Ran Out of Food in the Last Year: Never true  Transportation Needs: No Transportation Needs (08/13/2022)   PRAPARE - Administrator, Civil Service (Medical): No    Lack of Transportation (Non-Medical): No  Physical Activity: Unknown (07/03/2018)   Received from [facility]   Exercise Vital Sign    Days of Exercise  per Week: Patient declined    Minutes of Exercise per Session: Patient declined  Stress: No Stress Concern Present (07/03/2018)   Received from [facility] - Occupational Stress Questionnaire    Feeling of Stress : Not at all  Social Connections: Unknown (07/03/2018)   Received from [facility]   Social Connection and Isolation Panel    Frequency of Communication with Friends and Family: Patient declined    Frequency of Social Gatherings with Friends and Family: Patient declined    Attends Religious Services: Patient declined    Active Member of Clubs or Organizations: Patient declined    Attends Banker Meetings: Patient declined    Marital Status: Patient declined  Catering manager  Violence: Not At Risk (08/13/2022)   Humiliation, Afraid, Rape, and Kick questionnaire    Fear of Current or Ex-Partner: No    Emotionally Abused: No    Physically Abused: No    Sexually Abused: No    Review of Systems Constitutional: Patient denies any unintentional weight loss or change in strength lntegumentary: Patient denies any rashes or pruritus Eyes: Patient {Actions; denies-reports:120008} dry eyes ENT: Patient {Actions; denies-reports:120008} dry mouth Cardiovascular: Patient denies chest pain or syncope Respiratory: Patient denies shortness of breath Gastrointestinal: Patient {Actions; denies-reports:120008} constipation Musculoskeletal: Patient denies muscle cramps or weakness Neurologic: Patient denies convulsions or seizures Allergic/Immunologic: Patient denies recent allergic reaction(s) Hematologic/Lymphatic: Patient denies bleeding tendencies Endocrine: Patient denies heat/cold intolerance  GU: As per HPI.  OBJECTIVE There were no vitals filed for this visit. There is no height or weight on file to calculate BMI.  Physical Examination Constitutional: No obvious distress; patient is non-toxic appearing   Cardiovascular: No visible lower extremity edema.  Respiratory: The patient does not have audible wheezing/stridor; respirations do not appear labored  Gastrointestinal: Abdomen non-distended Musculoskeletal: Normal ROM of UEs  Skin: No obvious rashes/open sores  Neurologic: CN 2-12 grossly intact Psychiatric: Answered questions appropriately with normal affect  Hematologic/Lymphatic/Immunologic: No obvious bruises or sites of spontaneous bleeding  UA: ***negative ***positive for *** leukocytes, *** blood, ***nitrites Urine microscopy: *** WBC/hpf, *** RBC/hpf, *** bacteria ***glucosuria (secondary to ***Jardiance ***Farxiga use) ***otherwise unremarkable  PVR: *** ml  ASSESSMENT No diagnosis found.  We discussed the symptoms of overactive bladder (OAB), which include urinary urgency, frequency, nocturia, with or without urge incontinence.  While we may not know the exact etiology of OAB, several risk factors can be identified.  - Patient's neurogenic risk factors: ***T2DM ***with neuropathy, ***nicotine use, ***spinal stenosis, ***prior stroke, ***dementia.  - Patient's exacerbating factors include: ***diuretic use, ***caffeine intake, ***glucosuria (due to ***Jardiance / ***Farxiga use), ***ambulatory dysfunction (functional incontinence).   We discussed the following management options in detail including potential benefits, risks, and side effects: Behavioral therapy: Modify fluid intake Minimize / avoid bladder irritants (such as caffeine, spicy foods, acidic foods, alcohol) Bladder retraining / timed voiding Double voiding Medication(s): ***- We discussed potential side effects of anticholinergic medications such as urinary retention, dry eyes, dry mouth, constipation, confusion, cognitive impairment / dementia.  ***- Not a safe candidate for anticholinergic medications due to risk for side effects based on patient's age, comorbidities, and pre-existing ***dry mouth ***dry  eyes ***constipation ***dementia ***Parkinsons disease ***MS.  ***- Beta-3 agonist medications: We discussed potential side effects of beta-3 agonist medications such as urinary retention and (infrequently) elevated blood pressure.  ***- Combination therapy with anticholinergic medication + beta-3 agonist medication. 3. For refractory cases: PTNS (posterior tibial nerve stimulation) ***Not a safe candidate for PTNS due to ***bleeding disorder, ***anticoagulant use, ***pregnancy, ***pacemaker, ***implanted cardiac defibrillator (ICD), ***neuropathy / nerve damage / nerve conduction disorder, ***lower extremity metal implant(s).  Sacral neuromodulation trial (Medtronic lnterStim or Axonics implant) Bladder Botox injections  ***Consider discussing possible alternatives to ***Jardiance ***Farxiga with prescribing provider. This medication causes excess sugar to be excreted into the urine. That can prompt the kidneys to put out more water to dilute that sugar in the urine and it can also irritate the bladder lining, both of which may contribute to OAB symptoms (urinary frequency, urgency, and urge incontinence).  She decided to proceed with *** ***behavioral modifications including ***minimizing / avoiding caffeine intake and working on ***timed voiding / bladder retraining.  Will plan for  follow up in *** weeks / *** months or sooner if needed. Patient verbalized understanding and agreement. All questions were answered.  PLAN Advised the following: ***. *** ***. Minimize / avoid caffeine intake. ***. Work on timed voiding / bladder retraining. ***. No follow-ups on file.  No orders of the defined types were placed in this encounter.   It has been explained that the patient is to follow regularly with their PCP in addition to all other providers involved in their care and to follow instructions provided by these respective offices. Patient advised to contact urology clinic if any  urologic-pertaining questions, concerns, new symptoms or problems arise in the interim period.  There are no Patient Instructions on file for this visit.  Electronically signed by:  Lauraine JAYSON Oz, FNP   12/15/23    12:22 PM

## 2023-12-18 ENCOUNTER — Ambulatory Visit: Payer: 59 | Admitting: Urology

## 2023-12-18 DIAGNOSIS — N3941 Urge incontinence: Secondary | ICD-10-CM

## 2023-12-18 DIAGNOSIS — N3281 Overactive bladder: Secondary | ICD-10-CM

## 2023-12-19 DIAGNOSIS — M6281 Muscle weakness (generalized): Secondary | ICD-10-CM | POA: Diagnosis not present

## 2023-12-19 DIAGNOSIS — R293 Abnormal posture: Secondary | ICD-10-CM | POA: Diagnosis not present

## 2023-12-19 DIAGNOSIS — R2689 Other abnormalities of gait and mobility: Secondary | ICD-10-CM | POA: Diagnosis not present

## 2023-12-19 DIAGNOSIS — M5441 Lumbago with sciatica, right side: Secondary | ICD-10-CM | POA: Diagnosis not present

## 2023-12-26 DIAGNOSIS — M5441 Lumbago with sciatica, right side: Secondary | ICD-10-CM | POA: Diagnosis not present

## 2023-12-26 DIAGNOSIS — M6281 Muscle weakness (generalized): Secondary | ICD-10-CM | POA: Diagnosis not present

## 2023-12-26 DIAGNOSIS — R2689 Other abnormalities of gait and mobility: Secondary | ICD-10-CM | POA: Diagnosis not present

## 2023-12-26 DIAGNOSIS — R293 Abnormal posture: Secondary | ICD-10-CM | POA: Diagnosis not present

## 2023-12-28 DIAGNOSIS — M5441 Lumbago with sciatica, right side: Secondary | ICD-10-CM | POA: Diagnosis not present

## 2023-12-28 DIAGNOSIS — R293 Abnormal posture: Secondary | ICD-10-CM | POA: Diagnosis not present

## 2023-12-28 DIAGNOSIS — R2689 Other abnormalities of gait and mobility: Secondary | ICD-10-CM | POA: Diagnosis not present

## 2023-12-28 DIAGNOSIS — M6281 Muscle weakness (generalized): Secondary | ICD-10-CM | POA: Diagnosis not present

## 2024-01-02 DIAGNOSIS — M6281 Muscle weakness (generalized): Secondary | ICD-10-CM | POA: Diagnosis not present

## 2024-01-02 DIAGNOSIS — R293 Abnormal posture: Secondary | ICD-10-CM | POA: Diagnosis not present

## 2024-01-02 DIAGNOSIS — M5441 Lumbago with sciatica, right side: Secondary | ICD-10-CM | POA: Diagnosis not present

## 2024-01-02 DIAGNOSIS — R2689 Other abnormalities of gait and mobility: Secondary | ICD-10-CM | POA: Diagnosis not present

## 2024-01-04 DIAGNOSIS — M6281 Muscle weakness (generalized): Secondary | ICD-10-CM | POA: Diagnosis not present

## 2024-01-04 DIAGNOSIS — R293 Abnormal posture: Secondary | ICD-10-CM | POA: Diagnosis not present

## 2024-01-04 DIAGNOSIS — M5441 Lumbago with sciatica, right side: Secondary | ICD-10-CM | POA: Diagnosis not present

## 2024-01-04 DIAGNOSIS — R2689 Other abnormalities of gait and mobility: Secondary | ICD-10-CM | POA: Diagnosis not present

## 2024-01-08 DIAGNOSIS — E119 Type 2 diabetes mellitus without complications: Secondary | ICD-10-CM | POA: Diagnosis not present

## 2024-01-09 DIAGNOSIS — R2689 Other abnormalities of gait and mobility: Secondary | ICD-10-CM | POA: Diagnosis not present

## 2024-01-09 DIAGNOSIS — R293 Abnormal posture: Secondary | ICD-10-CM | POA: Diagnosis not present

## 2024-01-09 DIAGNOSIS — M6281 Muscle weakness (generalized): Secondary | ICD-10-CM | POA: Diagnosis not present

## 2024-01-09 DIAGNOSIS — M5441 Lumbago with sciatica, right side: Secondary | ICD-10-CM | POA: Diagnosis not present

## 2024-01-11 DIAGNOSIS — R293 Abnormal posture: Secondary | ICD-10-CM | POA: Diagnosis not present

## 2024-01-11 DIAGNOSIS — M6281 Muscle weakness (generalized): Secondary | ICD-10-CM | POA: Diagnosis not present

## 2024-01-11 DIAGNOSIS — M5441 Lumbago with sciatica, right side: Secondary | ICD-10-CM | POA: Diagnosis not present

## 2024-01-11 DIAGNOSIS — R2689 Other abnormalities of gait and mobility: Secondary | ICD-10-CM | POA: Diagnosis not present

## 2024-01-16 DIAGNOSIS — R293 Abnormal posture: Secondary | ICD-10-CM | POA: Diagnosis not present

## 2024-01-16 DIAGNOSIS — M5441 Lumbago with sciatica, right side: Secondary | ICD-10-CM | POA: Diagnosis not present

## 2024-01-16 DIAGNOSIS — R2689 Other abnormalities of gait and mobility: Secondary | ICD-10-CM | POA: Diagnosis not present

## 2024-01-16 DIAGNOSIS — M6281 Muscle weakness (generalized): Secondary | ICD-10-CM | POA: Diagnosis not present

## 2024-01-18 DIAGNOSIS — M6281 Muscle weakness (generalized): Secondary | ICD-10-CM | POA: Diagnosis not present

## 2024-01-18 DIAGNOSIS — M5441 Lumbago with sciatica, right side: Secondary | ICD-10-CM | POA: Diagnosis not present

## 2024-01-18 DIAGNOSIS — R293 Abnormal posture: Secondary | ICD-10-CM | POA: Diagnosis not present

## 2024-01-18 DIAGNOSIS — R2689 Other abnormalities of gait and mobility: Secondary | ICD-10-CM | POA: Diagnosis not present

## 2024-01-21 ENCOUNTER — Emergency Department (HOSPITAL_COMMUNITY)
Admission: EM | Admit: 2024-01-21 | Discharge: 2024-01-21 | Disposition: A | Discharge status: Home / Self Care | Attending: Emergency Medicine | Admitting: Emergency Medicine

## 2024-01-21 ENCOUNTER — Emergency Department (HOSPITAL_COMMUNITY)

## 2024-01-21 ENCOUNTER — Encounter (HOSPITAL_COMMUNITY): Payer: Self-pay | Admitting: Emergency Medicine

## 2024-01-21 ENCOUNTER — Other Ambulatory Visit: Payer: Self-pay

## 2024-01-21 DIAGNOSIS — W1809XA Striking against other object with subsequent fall, initial encounter: Secondary | ICD-10-CM | POA: Diagnosis not present

## 2024-01-21 DIAGNOSIS — Z853 Personal history of malignant neoplasm of breast: Secondary | ICD-10-CM | POA: Diagnosis not present

## 2024-01-21 DIAGNOSIS — M545 Low back pain, unspecified: Secondary | ICD-10-CM | POA: Insufficient documentation

## 2024-01-21 DIAGNOSIS — I10 Essential (primary) hypertension: Secondary | ICD-10-CM | POA: Diagnosis not present

## 2024-01-21 DIAGNOSIS — Z7982 Long term (current) use of aspirin: Secondary | ICD-10-CM | POA: Insufficient documentation

## 2024-01-21 DIAGNOSIS — M5459 Other low back pain: Secondary | ICD-10-CM | POA: Diagnosis not present

## 2024-01-21 DIAGNOSIS — Z79899 Other long term (current) drug therapy: Secondary | ICD-10-CM | POA: Diagnosis not present

## 2024-01-21 MED ORDER — LIDOCAINE 5 % EX PTCH: 1.0000 | MEDICATED_PATCH | CUTANEOUS | 0 refills | Status: DC

## 2024-01-21 MED ORDER — METHOCARBAMOL 500 MG PO TABS: 500.0000 mg | ORAL_TABLET | Freq: Two times a day (BID) | ORAL | 0 refills | Status: AC

## 2024-01-21 MED ORDER — HYDROCODONE-ACETAMINOPHEN 5-325 MG PO TABS
1.0000 | ORAL_TABLET | Freq: Once | ORAL | Status: AC
  Administered 2024-01-21: 1 via ORAL
  Filled 2024-01-21: qty 1

## 2024-01-21 NOTE — Discharge Instructions (Addendum)
 To take care of you today.  You were seen for back pain after a fall.  Fortunately your x-ray did not show any broken bones, you do have some degenerative changes and curvature.  Please follow-up with your PCP and/or the back specialist.  Come back to ER for new or worsening symptoms.  Prescribed muscle relaxers which can make you sleepy or dizzy so use caution when taking this, you are also prescribed lidocaine  patches.  You can take over-the-counter Tylenol  or ibuprofen  as instructed on packaging.

## 2024-01-21 NOTE — ED Notes (Signed)
 Patient transported to X-ray

## 2024-01-21 NOTE — ED Provider Notes (Signed)
 [place] EMERGENCY DEPARTMENT AT [facility] Provider Note   CSN: [id number] Arrival date & time: 01/21/24  1133     Patient presents with: Fall   Melissa Rosales is a 78 y.o. female.  She has a history of breast cancer reports 45 years ago, HTN, sciatica, degenerative disc disease.  She presents to ER today complaining of low back pain for the past 5 days after a fall.  States she was walking and her foot got caught and she fell to the ground.  States that she hit her back on the hard floor and think she may have hit her head on the carpet but has no LOC, no headache.  Is not on blood thinners.  She having back pain since that time, she tried Tylenol  once without significant relief.  She denies saddle anesthesia or paresthesia, no bowel or bladder incontinence.  She is able to ambulate with her cane which is what she uses at baseline.    Fall       Prior to Admission medications   Medication Sig Start Date End Date Taking? Authorizing Provider  amLODipine  (NORVASC ) 10 MG tablet Take 10 mg by mouth daily.     [provider]  anastrozole  (ARIMIDEX ) 1 MG tablet TAKE ONE TABLET BY MOUTH DAILY 12/14/23   Gudena, Vinay, MD  aspirin EC 81 MG tablet Take 81 mg by mouth daily.    [provider]  Calcium Carb-Cholecalciferol (CALCIUM + D3 PO) Take 1 tablet by mouth daily.     [provider]  clotrimazole-betamethasone  (LOTRISONE) cream Apply 1 application topically 2 (two) times daily as needed (for irritation).     [provider]  Cyanocobalamin (VITAMIN B-12 PO) Take 1 tablet by mouth daily.    [provider]  DULoxetine  (CYMBALTA ) 60 MG capsule Take 1 capsule (60 mg total) by mouth 2 (two) times daily. 05/18/21   Gayland Lauraine PARAS, NP  enalapril  (VASOTEC ) 20 MG tablet Take 20 mg by mouth daily.     [provider]  famotidine  (PEPCID ) 20 MG tablet Take 20 mg by mouth 2 (two) times daily. 03/01/21   [provider]   FIBER FORMULA CAPS Take 1 capsule by mouth daily.    [provider]  gabapentin  (NEURONTIN ) 600 MG tablet Take 1 tablet (600 mg total) by mouth 4 (four) times daily. Take 1 tablet by mouth 4 times daily 05/18/21   G[place] Lauraine PARAS, NP  hydrochlorothiazide  (HYDRODIURIL ) 25 MG tablet Take 25 mg by mouth daily.    [provider]  lactulose (CHRONULAC) 10 GM/15ML solution Take 10-20 g by mouth at bedtime as needed for moderate constipation.     [provider]  meloxicam (MOBIC) 15 MG tablet Take 15 mg by mouth every morning. 06/29/21   [provider]  mirabegron  ER (MYRBETRIQ ) 25 MG TB24 tablet Take 1 tablet (25 mg total) by mouth in the morning and at bedtime. 06/14/23   Larocco, Sarah C, FNP  Multiple Vitamins-Minerals (MULTIVITAMIN WITH MINERALS) tablet Take 1 tablet by mouth daily.    [provider]  Omega 3 1000 MG CAPS Take 1,000 mg by mouth daily.     [provider]  potassium chloride  (K-DUR) 10 MEQ tablet Take 10 mEq by mouth daily. 05/05/17   [provider]  ranitidine (ZANTAC) 150 MG tablet Take 150 mg by mouth daily.    [provider]  vitamin C  (ASCORBIC ACID ) 500 MG tablet Take 500 mg  by mouth daily.    [provider]    Allergies: Keflex [cephalexin]    Review of Systems  Updated Vital Signs BP 124/67   Pulse 67   Temp 98.9 F (37.2 C) (Oral)   Resp 18   Ht 5' 7 (1.702 m)   Wt 91.6 kg   SpO2 95%   BMI 31.64 kg/m   Physical Exam Vitals and nursing note reviewed.  Constitutional:      General: She is not in acute distress.    Appearance: She is well-developed.  HENT:     Head: Normocephalic and atraumatic.     Mouth/Throat:     Mouth: Mucous membranes are moist.  Eyes:     Conjunctiva/sclera: Conjunctivae normal.  Cardiovascular:     Rate and Rhythm: Normal rate and regular rhythm.     Heart sounds: No murmur heard. Pulmonary:     Effort: Pulmonary effort is normal. No  respiratory distress.     Breath sounds: Normal breath sounds.  Abdominal:     Palpations: Abdomen is soft.     Tenderness: There is no abdominal tenderness.  Musculoskeletal:        General: No swelling.     Cervical back: Neck supple.     Comments: Diffuse lumbar tenderness, there is no cervical thoracic tenderness.  There is no swelling or overlying skin changes.  Skin:    General: Skin is warm and dry.     Capillary Refill: Capillary refill takes less than 2 seconds.  Neurological:     General: No focal deficit present.     Mental Status: She is alert and oriented to person, place, and time.     Comments: Antalgic gait, patient ambulates with walker  Psychiatric:        Mood and Affect: Mood normal.     (all labs ordered are listed, but only abnormal results are displayed) Labs Reviewed - No data to display  EKG: None  Radiology: DG Lumbar Spine Complete Result Date: 01/21/2024 EXAM: 4 VIEW(S) XRAY OF THE LUMBAR SPINE 01/21/2024 12:47:51 PM COMPARISON: None available. CLINICAL HISTORY: Fall 5 days ago, continued pain. Per Triage: Pt to the ED with a complaints of a fall on Tuesday with continued back pain. Pt states she hit her head on the floor of carpet but denies LOC. FINDINGS: LUMBAR SPINE: BONES: No acute fracture. No aggressive appearing osseous lesion. Transitional anatomy is present; small ribs are present at presumably T12. Correlate with chest x-ray before intervention. 5 other non-weightbearing lumbar type vertebral bodies are present. The last fully formed vertebral body is labeled L5 for the purposes of this exam. Surgical clips are present within the anatomic pelvis. DISCS AND DEGENERATIVE CHANGES: Straightening of the normal lumbar lordosis is present. Grade 1 anterolisthesis is present at L4-5. Rightward curvature is centered at L1. Asymmetric right-sided endplate degenerative changes are present at L3-4 with compensatory leftward curvature at L4. SOFT TISSUES: No  acute abnormality. IMPRESSION: 1. No acute abnormality of the lumbar spine related to the fall. 2. Grade 1 anterolisthesis at L4-5. Electronically signed by: Lonni Necessary MD 01/21/2024 01:18 PM EDT RP Workstation: [id number]     Procedures   Medications Ordered in the ED  HYDROcodone -acetaminophen  (NORCO/VICODIN) 5-325 MG per tablet 1 tablet (1 tablet Oral Given 01/21/24 1210)  Medical Decision Making Diagnose includes but low to sprain, strain, contusion, fracture, other  Course: Patient here for low back pain ongoing for 5 days after a mechanical fall.  She states she did hit her head but it was not a hard strike it was on a soft surface and she is having no head pain headache, no neurologic symptoms.  Do not feel she has further workup for this but she is having continued low back pain.  She does not have any numbness tingling or weakness, [place] saddle anesthesia or paresthesia, no bowel or bladder incontinence.  She is able to ambulate like usual with her cane but is still having persistent pain and has pain especially when she walks or tries to move.  She has normal strength and sensation in her extremities.  Her pain is diffuse and not limited to the midline.  X-rays were obtained of her L-spine show no acute abnormality related to the fall but grade 1 anterior listhesis of L4 and L5 and also noted rightward curvature of L1 and lumbar curvature at L4.  Discussed with patient that she is on chronic findings but no acute fracture.  She is given a dose of hydrocodone  here and has some improvement.  Will discharge with small quantity of methocarbamol , OTC medicine, lidocaine  patches and close outpatient follow-up.  She was given strict return precautions.  We also discussed that the methocarbamol  can make her sleepy or unsteady on her feet and to use this with caution.  Amount and/or Complexity of Data Reviewed Radiology: ordered.  Risk Prescription  drug management.        Final diagnoses:  None    ED Discharge Orders     None          Suellen Sherran DELENA DEVONNA 01/21/24 1759    Melvenia Motto, MD 01/22/24 [phone]

## 2024-01-21 NOTE — ED Triage Notes (Signed)
 Pt to the ED with a complaints of a fall on Tuesday with continued back pain.  Pt states she hit hear head on the floor of carpet but denies LOC.

## 2024-01-21 NOTE — ED Notes (Signed)
 Pt/family received d/c paperwork at this time. After going over the paperwork any questions, comments, or concerns were answered to the best of this nurse's knowledge. The pt/family verbally acknowledged the teachings/instructions.

## 2024-01-23 ENCOUNTER — Inpatient Hospital Stay: Admitting: Hematology and Oncology

## 2024-01-24 DIAGNOSIS — M545 Low back pain, unspecified: Secondary | ICD-10-CM | POA: Diagnosis not present

## 2024-01-24 DIAGNOSIS — I10 Essential (primary) hypertension: Secondary | ICD-10-CM | POA: Diagnosis not present

## 2024-01-24 DIAGNOSIS — N183 Chronic kidney disease, stage 3 unspecified: Secondary | ICD-10-CM | POA: Diagnosis not present

## 2024-01-24 DIAGNOSIS — M51369 Other intervertebral disc degeneration, lumbar region without mention of lumbar back pain or lower extremity pain: Secondary | ICD-10-CM | POA: Diagnosis not present

## 2024-01-24 DIAGNOSIS — E7849 Other hyperlipidemia: Secondary | ICD-10-CM | POA: Diagnosis not present

## 2024-02-05 ENCOUNTER — Inpatient Hospital Stay: Attending: Hematology and Oncology | Admitting: Hematology and Oncology

## 2024-02-05 NOTE — Assessment & Plan Note (Deleted)
 06/06/2018:Left lumpectomy: Mucinous adenocarcinoma grade 1, 1.8 cm, DCIS, intermediate grade, negative for LV I or PNI, margins negative, DCIS focal less than 1 mm anterior margin, 0/4 lymph nodes negative, ER 90%, PR 0%, HER-2 negative, Ki-67 10%, T1CN0 stage Ia   Adjuvant radiation therapy at [facility]   Current treatment: Adjuvant antiestrogen therapy with anastrozole  1 mg daily x5 years started May 2020   Anastrozole  toxicities: Tolerating anastrozole  extremely well. Occasional hot flashes and occasional muscle aches and pains.   Breast cancer surveillance: mammogram 10/31/2023: Benign breast density category C Breast exam 02/05/2024: Benign   Legally blind. Return to clinic in 1 year for follow-up

## 2024-02-08 DIAGNOSIS — E119 Type 2 diabetes mellitus without complications: Secondary | ICD-10-CM | POA: Diagnosis not present

## 2024-02-12 NOTE — Progress Notes (Deleted)
 Referring Physician:  Leonce Lucie PARAS, PA-C [address] [place],  [place] [address]  Primary Physician:  Melissa Lucie PARAS, PA-C  History of Present Illness: 02/12/2024 [name] is here today with a chief complaint of ***  Low back pain, any leg pain?  She had a fall and went to the ER.    Duration: *** Location: *** Quality: *** Severity: ***  Precipitating: aggravated by *** Modifying factors: made better by *** Weakness: none Timing: *** Bowel/Bladder Dysfunction: none  Conservative measures:  Physical therapy: *** has participated in at [facility]? Multimodal medical therapy including regular antiinflammatories: *** tylenol , methocarbamol , meloxicam, lidocaine  patches, gabapentin  Injections: *** no epidural steroid injections  Past Surgery: *** 11/07/16: C4-5 ACDF by Dr. Alix Alvin FORBES Rosales has ***no symptoms of cervical myelopathy.  The symptoms are causing a significant impact on the patient's life.   Review of Systems:  A 10 point review of systems is negative, except for the pertinent positives and negatives detailed in the HPI.  Past Medical History: Past Medical History:  Diagnosis Date   Acid reflux    Arthritis    Cancer (HCC)    breast   Cervical myelopathy (HCC) 05/17/2017   C4-5   Hypertension    Personal history of radiation therapy     Past Surgical History: Past Surgical History:  Procedure Laterality Date   ANTERIOR CERVICAL DECOMP/DISCECTOMY FUSION N/A 11/07/2016   Procedure: ANTERIOR CERVICAL DECOMPRESSION/DISCECTOMY FUSION CERVICAL 4- CERVICAL 5;  Surgeon: Melissa Charleston, MD;  Location: [facility] OR;  Service: Neurosurgery;  Laterality: N/A;  ANTERIOR CERVICAL DECOMPRESSION/DISCECTOMY FUSION CERVICAL 4- CERVICAL 5   BREAST LUMPECTOMY Left 05/2018   BREAST LUMPECTOMY WITH RADIOACTIVE SEED AND SENTINEL LYMPH NODE BIOPSY Left 06/06/2018   Procedure: LEFT BREAST LUMPECTOMY WITH RADIOACTIVE SEED AND AXILLARY DEEP  SENTINEL LYMPH NODE BIOPSY WITH BLUE DYE INJECTION;  Surgeon: Melissa Favorite, MD;  Location: [facility] OR;  Service: General;  Laterality: Left;   COLONOSCOPY     EYE SURGERY Bilateral    HARDWARE REMOVAL N/A 11/07/2016   Procedure: anterior cervical disectomy fusion cervical four-five (revision);  Surgeon: Melissa Charleston, MD;  Location: [facility] OR;  Service: Neurosurgery;  Laterality: N/A;   VAGINAL HYSTERECTOMY     vision problems since birth      Allergies: Allergies as of 02/14/2024 - Review Complete 01/21/2024  Allergen Reaction Noted   Keflex [cephalexin] Nausea And Vomiting 08/26/2012    Medications: Outpatient Encounter Medications as of 02/14/2024  Medication Sig   amLODipine  (NORVASC ) 10 MG tablet Take 10 mg by mouth daily.    anastrozole  (ARIMIDEX ) 1 MG tablet TAKE ONE TABLET BY MOUTH DAILY   aspirin EC 81 MG tablet Take 81 mg by mouth daily.   Calcium Carb-Cholecalciferol (CALCIUM + D3 PO) Take 1 tablet by mouth daily.    clotrimazole-betamethasone  (LOTRISONE) cream Apply 1 application topically 2 (two) times daily as needed (for irritation).    Cyanocobalamin (VITAMIN B-12 PO) Take 1 tablet by mouth daily.   DULoxetine  (CYMBALTA ) 60 MG capsule Take 1 capsule (60 mg total) by mouth 2 (two) times daily.   enalapril  (VASOTEC ) 20 MG tablet Take 20 mg by mouth daily.    famotidine  (PEPCID ) 20 MG tablet Take 20 mg by mouth 2 (two) times daily.   FIBER FORMULA CAPS Take 1 capsule by mouth daily.   gabapentin  (NEURONTIN ) 600 MG tablet Take 1 tablet (600 mg total) by mouth 4 (four) times daily. Take 1 tablet by mouth 4 times daily  hydrochlorothiazide  (HYDRODIURIL ) 25 MG tablet Take 25 mg by mouth daily.   lactulose (CHRONULAC) 10 GM/15ML solution Take 10-20 g by mouth at bedtime as needed for moderate constipation.    lidocaine  (LIDODERM ) 5 % Place 1 patch onto the skin daily. Remove & Discard patch within 12 hours or as directed by MD   meloxicam (MOBIC) 15 MG tablet Take 15 mg by mouth  every morning.   methocarbamol  (ROBAXIN ) 500 MG tablet Take 1 tablet (500 mg total) by mouth 2 (two) times daily.   mirabegron  ER (MYRBETRIQ ) 25 MG TB24 tablet Take 1 tablet (25 mg total) by mouth in the morning and at bedtime.   Multiple Vitamins-Minerals (MULTIVITAMIN WITH MINERALS) tablet Take 1 tablet by mouth daily.   Omega 3 1000 MG CAPS Take 1,000 mg by mouth daily.    potassium chloride  (K-DUR) 10 MEQ tablet Take 10 mEq by mouth daily.   ranitidine (ZANTAC) 150 MG tablet Take 150 mg by mouth daily.   vitamin C  (ASCORBIC ACID ) 500 MG tablet Take 500 mg by mouth daily.   No facility-administered encounter medications on file as of 02/14/2024.    Social History: Social History   Tobacco Use   Smoking status: Never   Smokeless tobacco: Never  Vaping Use   Vaping status: Never Used  Substance Use Topics   Alcohol use: No   Drug use: No    Family Medical History: Family History  Problem Relation Age of Onset   Arthritis Other    Heart disease Mother    Depression Sister     Physical Examination: @VITALWITHPAIN @  General: Patient is well developed, well nourished, calm, collected, and in no apparent distress. Attention to examination is appropriate.  Psychiatric: Patient is non-anxious.  Head:  Pupils equal, round, and reactive to light.  ENT:  Oral mucosa appears well hydrated.  Neck:   Supple.  ***Full range of motion.  Respiratory: Patient is breathing without any difficulty.  Extremities: No edema.  Vascular: Palpable dorsal pedal pulses.  Skin:   On exposed skin, there are no abnormal skin lesions.  NEUROLOGICAL:     Awake, alert, oriented to person, place, and time.  Speech is clear and fluent. Fund of knowledge is appropriate.   Cranial Nerves: Pupils equal round and reactive to light.  Facial tone is symmetric.  Facial sensation is symmetric.  ROM of spine: ***full.  Palpation of spine: ***non tender.    Strength: Side Biceps Triceps Deltoid  Interossei Grip Wrist Ext. Wrist Flex.  R 5 5 5 5 5 5 5   L 5 5 5 5 5 5 5    Side Iliopsoas Quads Hamstring PF DF EHL  R 5 5 5 5 5 5   L 5 5 5 5 5 5    Reflexes are ***2+ and symmetric at the biceps, triceps, brachioradialis, patella and achilles.   Hoffman's is absent.  Clonus is not present.  Toes are down-going.  Bilateral upper and lower extremity sensation is intact to light touch.    Gait is normal.   No difficulty with tandem gait.   No evidence of dysmetria noted.  Medical Decision Making  Imaging: ***  I have personally reviewed the images and agree with the above interpretation.  Assessment and Plan: Ms. Urbanski is a pleasant 78 y.o. female with ***    Thank you for involving me in the care of this patient.   I spent a total of *** minutes in both face-to-face and non-face-to-face activities for this visit  on the date of this encounter.   Lyle Decamp, PA-C Dept. of Neurosurgery

## 2024-02-14 ENCOUNTER — Ambulatory Visit: Admitting: Physician Assistant

## 2024-03-06 ENCOUNTER — Ambulatory Visit: Admitting: Physician Assistant

## 2024-03-06 ENCOUNTER — Encounter: Payer: Self-pay | Admitting: Physician Assistant

## 2024-03-06 VITALS — BP 134/76 | Ht 67.0 in | Wt 199.0 lb

## 2024-03-06 DIAGNOSIS — M5416 Radiculopathy, lumbar region: Secondary | ICD-10-CM | POA: Diagnosis not present

## 2024-03-06 DIAGNOSIS — M5431 Sciatica, right side: Secondary | ICD-10-CM

## 2024-03-06 DIAGNOSIS — W19XXXA Unspecified fall, initial encounter: Secondary | ICD-10-CM

## 2024-03-06 DIAGNOSIS — M4316 Spondylolisthesis, lumbar region: Secondary | ICD-10-CM

## 2024-03-06 NOTE — Progress Notes (Signed)
 Referring Physician:  Leonce Lucie PARAS, PA-C [address] [place],  [place] [address]  Primary Physician:  Leonce Lucie PARAS, PA-C  History of Present Illness: 03/06/2024 [name] is here today with a history of C4-5 ACDF for cervical myelopathy, breast cancer, hypertension.  She comes in today for back pain that first occurred after she fell on 01/21/2024.  This has continued and she has had to use a walker to ambulate due to the pain.  It radiates to her right lower extremity on the outside to the top of the foot.  She has been in physical therapy but mostly for her shoulder pain and chronic lower extremity weakness.  No changes to saddle anesthesia or bowel or bladder.    Weakness: none Bowel/Bladder Dysfunction: none  Conservative measures:  Physical therapy:  has not participated in Multimodal medical therapy including regular antiinflammatories:  tylenol , methocarbamol , meloxicam, lidocaine  patches, gabapentin  Injections:  no epidural steroid injections  Past Surgery:  11/07/16: C4-5 ACDF by Dr. Alix    The symptoms are causing a significant impact on the patient's life.   Review of Systems:  A 10 point review of systems is negative, except for the pertinent positives and negatives detailed in the HPI.  Past Medical History: Past Medical History:  Diagnosis Date   Acid reflux    Arthritis    Cancer (HCC)    breast   Cervical myelopathy (HCC) 05/17/2017   C4-5   Hypertension    Personal history of radiation therapy     Past Surgical History: Past Surgical History:  Procedure Laterality Date   ANTERIOR CERVICAL DECOMP/DISCECTOMY FUSION N/A 11/07/2016   Procedure: ANTERIOR CERVICAL DECOMPRESSION/DISCECTOMY FUSION CERVICAL 4- CERVICAL 5;  Surgeon: Alix Charleston, MD;  Location: [facility] OR;  Service: Neurosurgery;  Laterality: N/A;  ANTERIOR CERVICAL DECOMPRESSION/DISCECTOMY FUSION CERVICAL 4- CERVICAL 5   BREAST LUMPECTOMY Left 05/2018   BREAST  LUMPECTOMY WITH RADIOACTIVE SEED AND SENTINEL LYMPH NODE BIOPSY Left 06/06/2018   Procedure: LEFT BREAST LUMPECTOMY WITH RADIOACTIVE SEED AND AXILLARY DEEP SENTINEL LYMPH NODE BIOPSY WITH BLUE DYE INJECTION;  Surgeon: Gail Favorite, MD;  Location: [facility] OR;  Service: General;  Laterality: Left;   COLONOSCOPY     EYE SURGERY Bilateral    HARDWARE REMOVAL N/A 11/07/2016   Procedure: anterior cervical disectomy fusion cervical four-five (revision);  Surgeon: Alix Charleston, MD;  [facility];  Service: Neurosurgery;  Laterality: N/A;   VAGINAL HYSTERECTOMY     vision problems since birth      Allergies: Allergies as of 02/14/2024 - Review Complete 01/21/2024  Allergen Reaction Noted   Keflex [cephalexin] Nausea And Vomiting 08/26/2012    Medications: Outpatient Encounter Medications as of 02/14/2024  Medication Sig   amLODipine  (NORVASC ) 10 MG tablet Take 10 mg by mouth daily.    anastrozole  (ARIMIDEX ) 1 MG tablet TAKE ONE TABLET BY MOUTH DAILY   aspirin EC 81 MG tablet Take 81 mg by mouth daily.   Calcium Carb-Cholecalciferol (CALCIUM + D3 PO) Take 1 tablet by mouth daily.    clotrimazole-betamethasone  (LOTRISONE) cream Apply 1 application topically 2 (two) times daily as needed (for irritation).    Cyanocobalamin (VITAMIN B-12 PO) Take 1 tablet by mouth daily.   DULoxetine  (CYMBALTA ) 60 MG capsule Take 1 capsule (60 mg total) by mouth 2 (two) times daily.   enalapril  (VASOTEC ) 20 MG tablet Take 20 mg by mouth daily.    famotidine  (PEPCID ) 20 MG tablet Take 20 mg by mouth 2 (two) times daily.  FIBER FORMULA CAPS Take 1 capsule by mouth daily.   gabapentin  (NEURONTIN ) 600 MG tablet Take 1 tablet (600 mg total) by mouth 4 (four) times daily. Take 1 tablet by mouth 4 times daily   hydrochlorothiazide  (HYDRODIURIL ) 25 MG tablet Take 25 mg by mouth daily.   lactulose (CHRONULAC) 10 GM/15ML solution Take 10-20 g by mouth at bedtime as needed for moderate constipation.    lidocaine   (LIDODERM ) 5 % Place 1 patch onto the skin daily. Remove & Discard patch within 12 hours or as directed by MD   meloxicam (MOBIC) 15 MG tablet Take 15 mg by mouth every morning.   methocarbamol  (ROBAXIN ) 500 MG tablet Take 1 tablet (500 mg total) by mouth 2 (two) times daily.   mirabegron  ER (MYRBETRIQ ) 25 MG TB24 tablet Take 1 tablet (25 mg total) by mouth in the morning and at bedtime.   Multiple Vitamins-Minerals (MULTIVITAMIN WITH MINERALS) tablet Take 1 tablet by mouth daily.   Omega 3 1000 MG CAPS Take 1,000 mg by mouth daily.    potassium chloride  (K-DUR) 10 MEQ tablet Take 10 mEq by mouth daily.   ranitidine (ZANTAC) 150 MG tablet Take 150 mg by mouth daily.   vitamin C  (ASCORBIC ACID ) 500 MG tablet Take 500 mg by mouth daily.   No facility-administered encounter medications on file as of 02/14/2024.    Social History: Social History   Tobacco Use   Smoking status: Never   Smokeless tobacco: Never  Vaping Use   Vaping status: Never Used  Substance Use Topics   Alcohol use: No   Drug use: No    Family Medical History: Family History  Problem Relation Age of Onset   Arthritis Other    Heart disease Mother    Depression Sister     Physical Examination: @VITALWITHPAIN @  General: Patient is well developed, well nourished, calm, collected, and in no apparent distress. Attention to examination is appropriate.  Psychiatric: Patient is non-anxious.  Head:  Pupils equal, round, and reactive to light.  ENT:  Oral mucosa appears well hydrated.  Neck:   Supple.    Respiratory: Patient is breathing without any difficulty.  Extremities: No edema.  Vascular: Palpable dorsal pedal pulses.  Skin:   On exposed skin, there are no abnormal skin lesions.  NEUROLOGICAL:     Awake, alert, oriented to person, place, and time.  Speech is clear and fluent. Fund of knowledge is appropriate.   Cranial Nerves: Pupils equal round and reactive to light.   ROM of spine: Tenderness  palpation of lumbar paraspinals. Positive straight leg raise  Strength:  Side Iliopsoas Quads Hamstring PF DF EHL  R 5 5 5 5 5 5   L 5 5 5 5 5 5    Reflexes are 3+ at bilateral patella.  Absent right Achilles reflex, 1+ left Achilles.   Clonus is not present.  Toes are down-going.  Bilateral upper and lower extremity sensation is intact to light touch.    Patient is ambulating with a walker.  Medical Decision Making  Imaging: EXAM: 4 VIEW(S) XRAY OF THE LUMBAR SPINE 01/21/2024 12:47:51 PM   COMPARISON: None available.   CLINICAL HISTORY: Fall 5 days ago, continued pain. Per Triage: Pt to the ED with a complaints of a fall on Tuesday with continued back pain. Pt states she hit her head on the floor of carpet but denies LOC.   FINDINGS:   LUMBAR SPINE:   BONES: No acute fracture. No aggressive appearing osseous lesion. Transitional  anatomy is present; small ribs are present at presumably T12. Correlate with chest x-ray before intervention. 5 other non-weightbearing lumbar type vertebral bodies are present. The last fully formed vertebral body is labeled L5 for the purposes of this exam. Surgical clips are present within the anatomic pelvis.   DISCS AND DEGENERATIVE CHANGES: Straightening of the normal lumbar lordosis is present. Grade 1 anterolisthesis is present at L4-5. Rightward curvature is centered at L1. Asymmetric right-sided endplate degenerative changes are present at L3-4 with compensatory leftward curvature at L4.   SOFT TISSUES: No acute abnormality.   IMPRESSION: 1. No acute abnormality of the lumbar spine related to the fall. 2. Grade 1 anterolisthesis at L4-5.    I have personally reviewed the images and agree with the above interpretation.  Assessment and Plan: Ms. Rocco is a pleasant 78 y.o. female  is here today with a history of C4-5 ACDF for cervical myelopathy, breast cancer, hypertension.  She comes in today for back pain that first  occurred after she fell on 01/21/2024.  This has continued and she has had to use a walker to ambulate due to the pain.  No frank weakness on examination.  She does have an absent Achilles reflex on her right side.  X-rays that were obtained in the ER were reviewed which does show listhesis at L4-5 and a rightward curvature at T12-L1.  Patient is likely having a L4-5 radiculopathy on her right lower extremity.  Plan moving forward includes the following:  -MRI lumbar spine  -PT referral placed specifically to work on her back and leg pain. -Continue Robaxin   Thank you for involving me in the care of this patient.   I spent a total of 30 minutes in both face-to-face and non-face-to-face activities for this visit on the date of this encounter including preparing to see the patient, obtaining and reviewing separately obtained history, performing medically appropriate examination, counseling the patient and their family, ordering additional tests, documenting clinical information, independently interpreting results, coordination of care.   Lyle Decamp, PA-C Dept. of Neurosurgery

## 2024-03-14 ENCOUNTER — Other Ambulatory Visit: Payer: Self-pay | Admitting: Hematology and Oncology

## 2024-03-15 ENCOUNTER — Encounter: Payer: Self-pay | Admitting: *Deleted

## 2024-03-15 NOTE — Progress Notes (Signed)
 RN contact pt regarding the need for yearly f/u for medication refills.  Appt scheduled.  Pt educated and verbalized understanding.  RN also mailed appt reminder to pt address on file per pt request.

## 2024-03-29 ENCOUNTER — Ambulatory Visit
Admission: RE | Admit: 2024-03-29 | Discharge: 2024-03-29 | Disposition: A | Discharge status: Home / Self Care | Source: Ambulatory Visit | Attending: Physician Assistant | Admitting: Physician Assistant

## 2024-03-29 DIAGNOSIS — M5431 Sciatica, right side: Secondary | ICD-10-CM

## 2024-04-02 ENCOUNTER — Ambulatory Visit: Payer: Self-pay | Admitting: Physician Assistant

## 2024-04-05 ENCOUNTER — Telehealth: Payer: Self-pay | Admitting: Orthopedic Surgery

## 2024-04-05 NOTE — Telephone Encounter (Signed)
 [facility] we set up a phone call with me next week.       Comments  Georgina Salli SAILOR 04/05/2024 02:20 PM EST  lvm  ------------------------------------  Rulon Mora F 04/04/2024 09:40 AM EST  Left message for patient to call our office back.    ------------------------------------  Georgina Salli SAILOR 04/02/2024 03:13 PM EST  Unable to lvm

## 2024-04-15 ENCOUNTER — Ambulatory Visit: Admitting: Physician Assistant

## 2024-04-15 DIAGNOSIS — S22080D Wedge compression fracture of T11-T12 vertebra, subsequent encounter for fracture with routine healing: Secondary | ICD-10-CM | POA: Diagnosis not present

## 2024-04-15 NOTE — Progress Notes (Unsigned)
 Spoke to patient over the phone regarding her MRI that was recently completed.  It did show a subacute T12 fracture that is likely causing a lot of her pain.  We did discuss a kyphoplasty as well as an ESI for her lower levels of her lumbar spine with stenosis that is likely causing more of her leg pain.  She would like to be seen for potential kyphoplasty and will arrange this referral.  She was encouraged reach out to me in the meantime for questions or concerns.  Plan to see back in 6 weeks with updated x-rays.    EXAM: MRI LUMBAR SPINE WITHOUT CONTRAST   TECHNIQUE: Multiplanar, multisequence MR imaging of the lumbar spine was performed. No intravenous contrast was administered.   COMPARISON:  Radiographs 01/21/2024   FINDINGS: Despite efforts by the technologist and patient, motion artifact is present on today's exam and could not be eliminated. This reduces exam sensitivity and specificity.   Segmentation: The lowest lumbar type non-rib-bearing vertebra is labeled as L5.   Alignment: 3 mm degenerative anterolisthesis at L4-5. Dextroconvex lumbar scoliosis with rotary component.   Vertebrae: Subacute T12 compression fracture with 50% loss of height anteriorly. This extends to the posterior vertebral body margin indicating middle column involvement but there is only 2-3 mm of posterior bony retropulsion. No definite posterior element involvement. Fracture plane still visible primarily below the superior endplate.   Disc desiccation at the visualized lower thoracic and lumbar levels except for L5-S1. Loss of disc height at all levels between T11 and L4.   Type 2 degenerative endplate findings at the L2-3 and L3-4 levels.   Minimal chronic superior endplate compression at T11.   Congenitally short pedicles in the lumbar spine.   Conus medullaris and cauda equina: Conus extends to the T12-L1 level. Conus and cauda equina appear normal.   Paraspinal and other soft tissues:  Unremarkable   Disc levels:   T10-11: Possible moderate bilateral foraminal impingement due to short pedicles, disc bulge, and facet arthropathy. This level is only included on the parasagittal images.   T11-12: Suspected moderate left foraminal stenosis due to disc bulge, short pedicles, and facet arthropathy. Suspected moderate central stenosis. This level is only included on the parasagittal images.   T12-L1: No impingement. Disc bulge noted with shallow left paracentral disc protrusion and facet arthropathy.   L1-2: Moderate central stenosis and moderate displacement of the L1 nerves in the lateral extraforaminal space due to disc bulge, intervertebral spurring, and short pedicles.   L2-3: Moderate to prominent central stenosis and moderate displacement of the left L2 nerve in the lateral extraforaminal space due to short pedicles, disc bulge, facet arthropathy, and left lateral extraforaminal disc protrusion.   L3-4: Prominent central stenosis with moderate right foraminal stenosis and borderline left foraminal stenosis due to short pedicles, disc bulge, intervertebral spurring, and facet arthropathy   L4-5: Markedly severe central stenosis with moderate right and borderline left foraminal stenosis and moderate to prominent bilateral subarticular lateral recess stenosis due to disc uncovering, disc bulge, facet arthropathy, short pedicles.   L5-S1: Borderline left foraminal stenosis due to left foraminal synovial cyst on image 12 series 303. Disc bulge noted.   IMPRESSION: 1. Subacute T12 compression fracture with 50% loss of height anteriorly. This extends to the posterior vertebral body margin indicating middle column involvement but there is only 2-3 mm of posterior bony retropulsion. No definite posterior element involvement. 2. Lumbar spondylosis, congenitally short pedicles, and degenerative disc disease causing prominent impingement at  L3-4 and L4-5; moderate  to prominent impingement at L2-3; and moderate impingement at T11-12, T10-11, and L1-2.    04/15/24 This visit was performed via telephone.  Patient location: home Provider location: office  I spent a total of 10 minutes non-face-to-face activities for this visit on the date of this encounter including review of current clinical condition and response to treatment.  The patient is aware of and accepts the limits of this telehealth visit.

## 2024-04-22 ENCOUNTER — Other Ambulatory Visit: Payer: Self-pay | Admitting: Physician Assistant

## 2024-04-22 DIAGNOSIS — S22080G Wedge compression fracture of T11-T12 vertebra, subsequent encounter for fracture with delayed healing: Secondary | ICD-10-CM

## 2024-04-26 NOTE — Progress Notes (Signed)
 Chief Complaint: Patient was seen in consultation today for T12 compression fracture.   Referring Physician(s): Frisbie,Brooke  History of Present Illness: Melissa Rosales is a 78 y.o. female with a medical history significant for HTN, left breast cancer, osteoporosis and cervical myelopathy s/p C4-5 ACDF. She was evaluated by her Neurosurgery team 03/06/24 for back pain that occurred after she fell 01/21/24. The pain radiates to the right lower extremity. X-ray imaging was negative for acute findings and an MRI of the lumbar spine was ordered. She attends physical therapy mostly for shoulder pain and chronic lower extremity weakness. A PT order was placed to specifically work on her back and leg pain.   The MRI was completed 03/29/24 and this revealed a T12 compression fracture. Her neurosurgery team discussed kyphoplasty and/or ESI to help with her back and lower extremity pain. The patient is interested in kyphoplasty and she has been referred to Interventional Radiology for further discussion. Her pain is mostly in her mid back and radiates to the right hip area.  The pain is worse when doing activities particularly standing, bending, or picking things up.  Her pain improves some with rest.  She has been going to physical therapy.  She states that her pain has not improved over the past month.  She rates her pain an 8/10.  Tramadol  was helping her pain some, but she has exhausted her prescription.  She scores an 11/24 on the [employer] Disability Questionnaire today.    Past Medical History:  Diagnosis Date   Acid reflux    Arthritis    Cancer (HCC)    breast   Cervical myelopathy (HCC) 05/17/2017   C4-5   Hypertension    Personal history of radiation therapy     Past Surgical History:  Procedure Laterality Date   ANTERIOR CERVICAL DECOMP/DISCECTOMY FUSION N/A 11/07/2016   Procedure: ANTERIOR CERVICAL DECOMPRESSION/DISCECTOMY FUSION CERVICAL 4- CERVICAL 5;  Surgeon: Alix Charleston, MD;  Location: [facility] OR;  Service: Neurosurgery;  Laterality: N/A;  ANTERIOR CERVICAL DECOMPRESSION/DISCECTOMY FUSION CERVICAL 4- CERVICAL 5   BREAST LUMPECTOMY Left 05/2018   BREAST LUMPECTOMY WITH RADIOACTIVE SEED AND SENTINEL LYMPH NODE BIOPSY Left 06/06/2018   Procedure: LEFT BREAST LUMPECTOMY WITH RADIOACTIVE SEED AND AXILLARY DEEP SENTINEL LYMPH NODE BIOPSY WITH BLUE DYE INJECTION;  Surgeon: Gail Favorite, MD;  Location: [facility] OR;  Service: General;  Laterality: Left;   COLONOSCOPY     EYE SURGERY Bilateral    HARDWARE REMOVAL N/A 11/07/2016   Procedure: anterior cervical disectomy fusion cervical four-five (revision);  Surgeon: Alix Charleston, MD;  Location: [facility] OR;  Service: Neurosurgery;  Laterality: N/A;   VAGINAL HYSTERECTOMY     vision problems since birth      Allergies: Keflex [cephalexin]  Medications: Prior to Admission medications   Medication Sig Start Date End Date Taking? Authorizing Provider  amLODipine  (NORVASC ) 10 MG tablet Take 10 mg by mouth daily.     [provider]  anastrozole  (ARIMIDEX ) 1 MG tablet TAKE ONE TABLET BY MOUTH DAILY 03/15/24   Gudena, Vinay, MD  aspirin EC 81 MG tablet Take 81 mg by mouth daily.    [provider]  Calcium Carb-Cholecalciferol (CALCIUM + D3 PO) Take 1 tablet by mouth daily.     [provider]  clotrimazole-betamethasone  (LOTRISONE) cream Apply 1 application topically 2 (two) times daily as needed (for irritation).     [provider]  Cyanocobalamin (VITAMIN B-12 PO) Take 1 tablet by mouth daily.  [provider]  DULoxetine  (CYMBALTA ) 60 MG capsule Take 1 capsule (60 mg total) by mouth 2 (two) times daily. 05/18/21   Gayland Lauraine PARAS, NP  enalapril  (VASOTEC ) 20 MG tablet Take 20 mg by mouth daily.     [provider]  famotidine  (PEPCID ) 20 MG tablet Take 20 mg by mouth 2 (two) times daily. 03/01/21   [provider]  FIBER FORMULA CAPS Take 1 capsule by mouth  daily.    [provider]  gabapentin  (NEURONTIN ) 600 MG tablet Take 1 tablet (600 mg total) by mouth 4 (four) times daily. Take 1 tablet by mouth 4 times daily 05/18/21   Gayland Lauraine PARAS, NP  hydrochlorothiazide  (HYDRODIURIL ) 25 MG tablet Take 25 mg by mouth daily.    [provider]  lactulose (CHRONULAC) 10 GM/15ML solution Take 10-20 g by mouth at bedtime as needed for moderate constipation.     [provider]  meloxicam (MOBIC) 15 MG tablet Take 15 mg by mouth every morning. 06/29/21   [provider]  methocarbamol  (ROBAXIN ) 500 MG tablet Take 1 tablet (500 mg total) by mouth 2 (two) times daily. 01/21/24   Suellen Cantor A, PA-C  mirabegron  ER (MYRBETRIQ ) 25 MG TB24 tablet Take 1 tablet (25 mg total) by mouth in the morning and at bedtime. 06/14/23   Larocco, Sarah C, FNP  Multiple Vitamins-Minerals (MULTIVITAMIN WITH MINERALS) tablet Take 1 tablet by mouth daily.    [provider]  Omega 3 1000 MG CAPS Take 1,000 mg by mouth daily.     [provider]  potassium chloride  (K-DUR) 10 MEQ tablet Take 10 mEq by mouth daily. 05/05/17   [provider]  ranitidine (ZANTAC) 150 MG tablet Take 150 mg by mouth daily.    [provider]  vitamin C  (ASCORBIC ACID ) 500 MG tablet Take 500 mg by mouth daily.    [provider]     Family History  Problem Relation Age of Onset   Arthritis Other    Heart disease Mother    Depression Sister     Social History   Socioeconomic History   Marital status: Legally Separated    Spouse name: Not on file   Number of children: 0   Years of education: 10   Highest education level: Not on file  Occupational History   Occupation: Retired  Tobacco Use   Smoking status: Never   Smokeless tobacco: Never  Vaping Use   Vaping status: Never Used  Substance and Sexual Activity   Alcohol use: No   Drug use: No   Sexual activity: Never  Other Topics Concern   Not on file   Social History Narrative   Lives alone. Niece stays with her.   Caffeine use: Drinks coffee- 1 cup per day   Social Drivers of Health   Financial Resource Strain: Unknown (07/03/2018)   Received from [facility]   Overall Financial Resource Strain (CARDIA)    Difficulty of Paying Living Expenses: Patient declined  Food Insecurity: No Food Insecurity (08/13/2022)   Hunger Vital Sign    Worried About Running Out of Food in the Last Year: Never true    Ran Out of Food in the Last Year: Never true  Transportation Needs: No Transportation Needs (08/13/2022)   PRAPARE - Administrator, Civil Service (Medical): No    Lack of Transportation (Non-Medical): No  Physical Activity: Unknown (07/03/2018)   Received from [facility]  Exercise Vital Sign    Days of Exercise per Week: Patient declined    Minutes of Exercise per Session: Patient declined  Stress: No Stress Concern Present (07/03/2018)   Received from [facility] of Occupational [facility] - Occupational Stress Questionnaire    Feeling of Stress : Not at all  Social Connections: Unknown (07/03/2018)   Received from [facility]   Social Connection and Isolation Panel    Frequency of Communication with Friends and Family: Patient declined    Frequency of Social Gatherings with Friends and Family: Patient declined    Attends Religious Services: Patient declined    Database Administrator or Organizations: Patient declined    Attends Banker Meetings: Patient declined    Marital Status: Patient declined    Review of Systems: A 12 point ROS discussed and pertinent positives are indicated in the HPI above.  All other systems are negative.  Vital Signs: There were no vitals taken for this visit.  Advance Care Plan: The advanced care plan/surrogate decision maker was discussed at the time of visit and documented in the medical record.   Physical Exam Constitutional:       General: She is not in acute distress. HENT:     Head: Normocephalic.     Mouth/Throat:     Mouth: Mucous membranes are moist.  Eyes:     General: No scleral icterus. Cardiovascular:     Rate and Rhythm: Normal rate and regular rhythm.  Pulmonary:     Effort: No respiratory distress.  Abdominal:     General: There is no distension.  Musculoskeletal:       Back:     Right lower leg: No edema.     Left lower leg: No edema.     Comments: Tender to palpation.  Skin:    Coloration: Skin is not jaundiced.  Neurological:     Mental Status: She is alert and oriented to person, place, and time.     Imaging:  MR Lumbar Spine 03/29/24    IMPRESSION: 1. Subacute T12 compression fracture with 50% loss of height anteriorly. This extends to the posterior vertebral body margin indicating middle column involvement but there is only 2-3 mm of posterior bony retropulsion. No definite posterior element involvement. 2. Lumbar spondylosis, congenitally short pedicles, and degenerative disc disease causing prominent impingement at L3-4 and L4-5; moderate to prominent impingement at L2-3; and moderate impingement at T11-12, T10-11, and L1-2  Labs:  CBC: No results for input(s): WBC, HGB, HCT, PLT in the last 8760 hours.  COAGS: No results for input(s): INR, APTT in the last 8760 hours.  BMP: No results for input(s): NA, K, CL, CO2, GLUCOSE, BUN, CALCIUM, CREATININE, GFRNONAA, GFRAA in the last 8760 hours.  Invalid input(s): CMP  LIVER FUNCTION TESTS: No results for input(s): BILITOT, AST, ALT, ALKPHOS, PROT, ALBUMIN in the last 8760 hours.  TUMOR MARKERS: No results for input(s): AFPTM, CEA, CA199, CHROMGRNA in the last 8760 hours.  Assessment and Plan:  78 year old female with a history of osteoporosis and a recent fall resulting in a T12 compression fracture with persistent back pain.   Assessment & Plan:   Patient has  suffered acute osteoporotic fracture of the T12 vertebra.   History and exam have demonstrated the following:  Acute/Subacute fracture by imaging dated 03/29/24, Pain on exam concordant with level of fracture, Failure of conservative therapy and pain refractory to narcotic pain mediation, Inability to tolerate narcotic  pain medication due to side effects, and Significant disability on the [employer] Disability Questionnaire with 11/24 positive symptoms, reflecting significant impact/impairment of (ADLs)   ICD-10-CM Codes that Support Medical Necessity (welshblog.at.[email or web address]?[email or web address])  M80.08XA    Age-related osteoporosis with current pathological fracture, vertebra(e), initial encounter for fracture   Plan:  T12 vertebral body augmentation with balloon kyphoplasty  Post-procedure disposition: outpatient - [facility]  Medication holds: 81 mg aspirin  The patient has suffered a fracture of the T12 vertebral body. It is recommended that patients aged 87 years or older be evaluated for possible testing or treatment of osteoporosis. A copy of this consult report is sent to the patient's referring physician.  Advanced Care Plan: The patient has an Advanced Care Plan or Surrogate Decision Maker documented in the EMR     Ester Sides, MD Pager: [phone]    I spent a total of  40 Minutes   in face to face in clinical consultation, greater than 50% of which was counseling/coordinating care for T12 compression fracture.

## 2024-04-29 ENCOUNTER — Inpatient Hospital Stay
Admission: RE | Admit: 2024-04-29 | Discharge: 2024-04-29 | Attending: Physician Assistant | Admitting: Physician Assistant

## 2024-04-29 DIAGNOSIS — S22080G Wedge compression fracture of T11-T12 vertebra, subsequent encounter for fracture with delayed healing: Secondary | ICD-10-CM

## 2024-04-29 HISTORY — PX: IR RADIOLOGIST EVAL & MGMT: IMG5224

## 2024-05-01 ENCOUNTER — Other Ambulatory Visit: Payer: Self-pay | Admitting: Interventional Radiology

## 2024-05-01 DIAGNOSIS — M8008XA Age-related osteoporosis with current pathological fracture, vertebra(e), initial encounter for fracture: Secondary | ICD-10-CM

## 2024-05-02 ENCOUNTER — Telehealth: Payer: Self-pay

## 2024-05-02 ENCOUNTER — Inpatient Hospital Stay: Attending: Hematology and Oncology | Admitting: Hematology and Oncology

## 2024-05-02 NOTE — Assessment & Plan Note (Deleted)
° °  06/06/2018:Left lumpectomy: Mucinous adenocarcinoma grade 1, 1.8 cm, DCIS, intermediate grade, negative for LV I or PNI, margins negative, DCIS focal less than 1 mm anterior margin, 0/4 lymph nodes negative, ER 90%, PR 0%, HER-2 negative, Ki-67 10%, T1CN0 stage Ia   Adjuvant radiation therapy at [facility]   Current treatment: Adjuvant antiestrogen therapy with anastrozole  1 mg daily x5 years started May 2020   Anastrozole  toxicities: Tolerating anastrozole  extremely well. Occasional hot flashes and occasional muscle aches and pains.   Breast cancer surveillance: mammogram 10/31/2023: Benign breast density category C Breast exam 05/02/2024: Benign   Legally blind. Return to clinic in 1 year for follow-up

## 2024-05-02 NOTE — Telephone Encounter (Signed)
 See telephone note  Asked patient if she has had any recent labs completed, she states that she doesn't believe so, this RN unable to find any in EPIC/CareEverywhere

## 2024-05-02 NOTE — Telephone Encounter (Signed)
 Melissa Rosales

## 2024-05-02 NOTE — Discharge Instructions (Signed)
 Kyphoplasty Post Procedure Discharge Instructions  May resume a regular diet and any medications that you routinely take (including pain medications). However, if you are taking Aspirin  or an anticoagulant/blood thinner you will be told when you can resume taking these by the healthcare provider. No driving day of procedure. The day of your procedure take it easy. You may use an ice pack as needed to injection sites on back.  Ice to back 30 minutes on and 30 minutes off, as needed. May remove bandaids tomorrow after taking a shower. Replace daily with a clean bandaid until healed.  Do not lift anything heavier than a milk jug for 1-2 weeks or determined by your physician.  Follow up with your physician in 2 weeks.  If you need to speak to someone after hours, please call the on call IR physician at [phone].  Tell them you are a patient of Dr. Karalee and that you had a Kyphoplasty today and the issues you are having.   Please contact our office at [phone] for the following symptoms or if you have any questions:  Fever greater than 100 degrees Increased swelling, pain, or redness at injection site. Increased back and/or leg pain New numbness or change in symptoms from before the procedure.    Thank you for visiting [facility]!

## 2024-05-03 NOTE — Progress Notes (Signed)
 Chief Complaint: Patient was seen in consultation today for T12 compression fracture.   Referring Physician(s): Frisbie,Brooke   Patient Status: [facility] - Outpatient   History of Present Illness: Melissa Rosales is a 78 y.o. female with a medical history significant for HTN, left breast cancer, osteoporosis and cervical myelopathy s/p C4-5 ACDF. She was evaluated by her Neurosurgery team 03/06/24 for back pain that occurred after she fell 01/21/24. The pain radiates to the right lower extremity. X-ray imaging was negative for acute findings and an MRI of the lumbar spine was ordered. She attends physical therapy mostly for shoulder pain and chronic lower extremity weakness. A PT order was placed to specifically work on her back and leg pain.    The MRI was completed 03/29/24 and this revealed a T12 compression fracture. Her neurosurgery team discussed kyphoplasty and/or ESI to help with her back and lower extremity pain. The patient was interested in kyphoplasty and she was referred to Interventional Radiology for further discussion. We met in consultation 04/29/24 and she reported mid back/right hip pain that was worse with most activities. She had been attending physical therapy and taking tramadol  without much improvement.  We discussed the procedure details, risks, benefits and alternatives of T12 kyphoplasty. She was agreeable to proceed.    Past Medical History:  Diagnosis Date   Acid reflux    Arthritis    Cancer (HCC)    breast   Cervical myelopathy (HCC) 05/17/2017   C4-5   Hypertension    Personal history of radiation therapy     Past Surgical History:  Procedure Laterality Date   ANTERIOR CERVICAL DECOMP/DISCECTOMY FUSION N/A 11/07/2016   Procedure: ANTERIOR CERVICAL DECOMPRESSION/DISCECTOMY FUSION CERVICAL 4- CERVICAL 5;  Surgeon: Alix Charleston, MD;  Location: [facility] OR;  Service: Neurosurgery;  Laterality: N/A;  ANTERIOR CERVICAL DECOMPRESSION/DISCECTOMY FUSION  CERVICAL 4- CERVICAL 5   BREAST LUMPECTOMY Left 05/2018   BREAST LUMPECTOMY WITH RADIOACTIVE SEED AND SENTINEL LYMPH NODE BIOPSY Left 06/06/2018   Procedure: LEFT BREAST LUMPECTOMY WITH RADIOACTIVE SEED AND AXILLARY DEEP SENTINEL LYMPH NODE BIOPSY WITH BLUE DYE INJECTION;  Surgeon: Gail Favorite, MD;  Location: [facility] OR;  Service: General;  Laterality: Left;   COLONOSCOPY     EYE SURGERY Bilateral    HARDWARE REMOVAL N/A 11/07/2016   Procedure: anterior cervical disectomy fusion cervical four-five (revision);  Surgeon: Alix Charleston, MD;  Location: [facility] OR;  Service: Neurosurgery;  Laterality: N/A;   IR RADIOLOGIST EVAL & MGMT  04/29/2024   VAGINAL HYSTERECTOMY     vision problems since birth      Allergies: Keflex [cephalexin]  Medications: Prior to Admission medications  Medication Sig Start Date End Date Taking? Authorizing Provider  amLODipine  (NORVASC ) 10 MG tablet Take 10 mg by mouth daily.     [provider]  anastrozole  (ARIMIDEX ) 1 MG tablet TAKE ONE TABLET BY MOUTH DAILY 03/15/24   Gudena, Vinay, MD  aspirin EC 81 MG tablet Take 81 mg by mouth daily.    [provider]  Calcium Carb-Cholecalciferol (CALCIUM + D3 PO) Take 1 tablet by mouth daily.     [provider]  clotrimazole-betamethasone  (LOTRISONE) cream Apply 1 application topically 2 (two) times daily as needed (for irritation).     [provider]  Cyanocobalamin (VITAMIN B-12 PO) Take 1 tablet by mouth daily.    [provider]  DULoxetine  (CYMBALTA ) 60 MG capsule Take 1 capsule (60 mg total) by mouth 2 (two) times daily. 05/18/21  Gayland Lauraine PARAS, NP  enalapril  (VASOTEC ) 20 MG tablet Take 20 mg by mouth daily.     [provider]  famotidine  (PEPCID ) 20 MG tablet Take 20 mg by mouth 2 (two) times daily. 03/01/21   [provider]  FIBER FORMULA CAPS Take 1 capsule by mouth daily.    [provider]  gabapentin  (NEURONTIN ) 600 MG tablet Take 1  tablet (600 mg total) by mouth 4 (four) times daily. Take 1 tablet by mouth 4 times daily 05/18/21   Gayland Lauraine PARAS, NP  hydrochlorothiazide  (HYDRODIURIL ) 25 MG tablet Take 25 mg by mouth daily.    [provider]  lactulose (CHRONULAC) 10 GM/15ML solution Take 10-20 g by mouth at bedtime as needed for moderate constipation.     [provider]  meloxicam (MOBIC) 15 MG tablet Take 15 mg by mouth every morning. 06/29/21   [provider]  methocarbamol  (ROBAXIN ) 500 MG tablet Take 1 tablet (500 mg total) by mouth 2 (two) times daily. 01/21/24   Suellen Cantor A, PA-C  mirabegron  ER (MYRBETRIQ ) 25 MG TB24 tablet Take 1 tablet (25 mg total) by mouth in the morning and at bedtime. 06/14/23   Larocco, Sarah C, FNP  Multiple Vitamins-Minerals (MULTIVITAMIN WITH MINERALS) tablet Take 1 tablet by mouth daily.    [provider]  Omega 3 1000 MG CAPS Take 1,000 mg by mouth daily.     [provider]  potassium chloride  (K-DUR) 10 MEQ tablet Take 10 mEq by mouth daily. 05/05/17   [provider]  ranitidine (ZANTAC) 150 MG tablet Take 150 mg by mouth daily.    [provider]  vitamin C  (ASCORBIC ACID ) 500 MG tablet Take 500 mg by mouth daily.    [provider]     Family History  Problem Relation Age of Onset   Arthritis Other    Heart disease Mother    Depression Sister     Social History   Socioeconomic History   Marital status: Legally Separated    Spouse name: Not on file   Number of children: 0   Years of education: 10   Highest education level: Not on file  Occupational History   Occupation: Retired  Tobacco Use   Smoking status: Never   Smokeless tobacco: Never  Vaping Use   Vaping status: Never Used  Substance and Sexual Activity   Alcohol use: No   Drug use: No   Sexual activity: Never  Other Topics Concern   Not on file  Social History Narrative   Lives alone. Niece stays with her.   Caffeine use: Drinks  coffee- 1 cup per day   Social Drivers of Health   Tobacco Use: Low Risk (03/06/2024)   Patient History    Smoking Tobacco Use: Never    Smokeless Tobacco Use: Never    Passive Exposure: Not on file  Financial Resource Strain: Not on file  Food Insecurity: No Food Insecurity (08/13/2022)   Hunger Vital Sign    Worried About Running Out of Food in the Last Year: Never true    Ran Out of Food in the Last Year: Never true  Transportation Needs: No Transportation Needs (08/13/2022)   PRAPARE - Administrator, Civil Service (Medical): No    Lack of Transportation (Non-Medical): No  Physical Activity: Not on file  Stress: Not on file  Social Connections: Not on file  Depression (EYV7-0): Not on file  Alcohol Screen: Not on file  Housing: Medium Risk (08/13/2022)   Housing    Last Housing Risk Score: 1  Utilities: Not At Risk (08/13/2022)   AHC Utilities    Threatened with loss of utilities: No  Health Literacy: Not on file    Review of Systems: A 12 point ROS discussed and pertinent positives are indicated in the HPI above.  All other systems are negative.  Vital Signs: There were no vitals taken for this visit.  Physical Exam Constitutional:      General: She is not in acute distress. HENT:     Head: Normocephalic.     Mouth/Throat:     Pharynx: No oropharyngeal exudate.  Eyes:     General: No scleral icterus. Cardiovascular:     Rate and Rhythm: Normal rate and regular rhythm.  Pulmonary:     Effort: No respiratory distress.  Abdominal:     General: There is no distension.  Skin:    General: Skin is warm and dry.  Neurological:     Mental Status: She is alert and oriented to person, place, and time.     Labs:  CBC: No results for input(s): WBC, HGB, HCT, PLT in the last 8760 hours.  COAGS: No results for input(s): INR, APTT in the last 8760 hours.  BMP: No results for input(s): NA, K, CL, CO2, GLUCOSE, BUN, CALCIUM,  CREATININE, GFRNONAA, GFRAA in the last 8760 hours.  Invalid input(s): CMP  LIVER FUNCTION TESTS: No results for input(s): BILITOT, AST, ALT, ALKPHOS, PROT, ALBUMIN in the last 8760 hours.  TUMOR MARKERS: No results for input(s): AFPTM, CEA, CA199, CHROMGRNA in the last 8760 hours.  Assessment and Plan:  T12 compression fracture: Nera E. Ehmann, 78 year old female, presents today for an image-guided T12 kyphoplasty/vertebroplasty.   Risks and benefits of T12 kyphoplasty/vertebroplasty were discussed with the patient including, but not limited to education regarding the natural healing process of compression fractures without intervention, bleeding, infection, cement migration which may cause spinal cord damage, paralysis, pulmonary embolism or even death.  All of the patient's questions were answered, patient is agreeable to proceed. She has been NPO. 81 mg aspirin has been held.   Consent signed and in chart.  Ester Sides, MD Pager: [phone]

## 2024-05-06 ENCOUNTER — Inpatient Hospital Stay
Admission: RE | Admit: 2024-05-06 | Discharge: 2024-05-06 | Disposition: A | Source: Ambulatory Visit | Attending: Interventional Radiology

## 2024-05-06 DIAGNOSIS — M8008XA Age-related osteoporosis with current pathological fracture, vertebra(e), initial encounter for fracture: Secondary | ICD-10-CM

## 2024-05-06 HISTORY — PX: IR KYPHO THORACIC WITH BONE BIOPSY: IMG5518

## 2024-05-06 MED ORDER — FENTANYL CITRATE (PF) 100 MCG/2ML IJ SOLN
INTRAMUSCULAR | Status: AC | PRN
  Administered 2024-05-06: 11:00:00 50 ug via INTRAVENOUS
  Administered 2024-05-06 (×2): 25 ug via INTRAVENOUS

## 2024-05-06 MED ORDER — FENTANYL CITRATE (PF) 50 MCG/ML IJ SOSY: 25.0000 ug | PREFILLED_SYRINGE | INTRAMUSCULAR | Status: DC | PRN

## 2024-05-06 MED ORDER — MIDAZOLAM HCL (PF) 2 MG/2ML IJ SOLN: 1.0000 mg | INTRAMUSCULAR | Status: DC | PRN

## 2024-05-06 MED ORDER — ACETAMINOPHEN 10 MG/ML IV SOLN
1000.0000 mg | Freq: Once | INTRAVENOUS | Status: AC
  Administered 2024-05-06: 10:00:00 1000 mg via INTRAVENOUS

## 2024-05-06 MED ORDER — VANCOMYCIN HCL IN DEXTROSE 1-5 GM/200ML-% IV SOLN
1000.0000 mg | Freq: Once | INTRAVENOUS | Status: AC
  Administered 2024-05-06: 10:00:00 1000 mg via INTRAVENOUS

## 2024-05-06 MED ORDER — LIDOCAINE-EPINEPHRINE 1 %-1:100000 IJ SOLN
20.0000 mL | Freq: Once | INTRAMUSCULAR | Status: AC
  Administered 2024-05-06: 11:00:00 20 mL via INTRADERMAL

## 2024-05-06 MED ORDER — SODIUM CHLORIDE 0.9 % IV SOLN: INTRAVENOUS | Status: DC

## 2024-05-06 MED ORDER — MIDAZOLAM HCL (PF) 2 MG/2ML IJ SOLN
INTRAMUSCULAR | Status: AC | PRN
  Administered 2024-05-06 (×2): 1 mg via INTRAVENOUS

## 2024-05-06 MED ORDER — KETOROLAC TROMETHAMINE 30 MG/ML IJ SOLN
30.0000 mg | Freq: Once | INTRAMUSCULAR | Status: AC
  Administered 2024-05-06: 11:00:00 15 mg via INTRAVENOUS

## 2024-05-06 NOTE — Procedures (Signed)
 Interventional Radiology Procedure Note  Procedure: T12 kyphoplasty  Findings: Please refer to procedural dictation for full description.  Complications: None immediate  Estimated Blood Loss: <5 mL  Recommendations: IR will arrange 3 week follow up.   Ester Sides, MD

## 2024-05-08 ENCOUNTER — Telehealth: Payer: Self-pay

## 2024-05-08 NOTE — Telephone Encounter (Signed)
 See telephone note   Pt encouraged to have family keep an eye on back area and make sure incisions are healing, small amount of drainage ok, This RN asked patient if right hand was ok post PIV infiltration from 05/06/24, pt states her hand is fine and swelling has gone down, Pt asked if exercise is ok, this RN informed her it was, but don't overdue it, don't lift over 5-10 pounds at this time

## 2024-06-04 ENCOUNTER — Inpatient Hospital Stay: Attending: Hematology and Oncology | Admitting: Hematology and Oncology

## 2024-06-04 VITALS — BP 121/64 | HR 68 | Temp 97.3°F | Resp 18 | Wt 200.6 lb

## 2024-06-04 DIAGNOSIS — Z08 Encounter for follow-up examination after completed treatment for malignant neoplasm: Secondary | ICD-10-CM | POA: Diagnosis present

## 2024-06-04 DIAGNOSIS — C50412 Malignant neoplasm of upper-outer quadrant of left female breast: Secondary | ICD-10-CM | POA: Diagnosis not present

## 2024-06-04 DIAGNOSIS — Z853 Personal history of malignant neoplasm of breast: Secondary | ICD-10-CM | POA: Insufficient documentation

## 2024-06-04 DIAGNOSIS — Z170 Estrogen receptor positive status [ER+]: Secondary | ICD-10-CM

## 2024-06-04 DIAGNOSIS — Z923 Personal history of irradiation: Secondary | ICD-10-CM | POA: Insufficient documentation

## 2024-06-04 DIAGNOSIS — H548 Legal blindness, as defined in USA: Secondary | ICD-10-CM | POA: Insufficient documentation

## 2024-06-04 NOTE — Assessment & Plan Note (Signed)
 06/06/2018:Left lumpectomy: Mucinous adenocarcinoma grade 1, 1.8 cm, DCIS, intermediate grade, negative for LV I or PNI, margins negative, DCIS focal less than 1 mm anterior margin, 0/4 lymph nodes negative, ER 90%, PR 0%, HER-2 negative, Ki-67 10%, T1CN0 stage Ia   Adjuvant radiation therapy at [facility]   Current treatment: Adjuvant antiestrogen therapy with anastrozole  1 mg daily x5 years started May 2020   Anastrozole  toxicities: Tolerating anastrozole  extremely well. Occasional hot flashes and occasional muscle aches and pains.   Breast cancer surveillance: mammogram 10/31/2023: Benign breast density category C Breast exam 06/04/2024: Benign   Legally blind. Return to clinic in 1 year for follow-up

## 2024-06-04 NOTE — Progress Notes (Signed)
 "  Patient Care Team: [name] as PCP - General (Family Medicine) Shari Easter, MD as Consulting Physician (Orthopedic Surgery) Tyree Nanetta SAILOR, RN as Oncology Nurse Navigator Gail Favorite, MD as Consulting Physician (General Surgery) Odean Potts, MD as Consulting Physician (Hematology and Oncology) Kennyth Hummer, MD as Referring Physician (Radiation Oncology)  DIAGNOSIS:  Encounter Diagnosis  Name Primary?   Malignant neoplasm of upper-outer quadrant of left breast in female, estrogen receptor positive (HCC) Yes    SUMMARY OF ONCOLOGIC HISTORY: Oncology History  Malignant neoplasm of upper-outer quadrant of left breast in female, estrogen receptor positive (HCC)  04/03/2018 Initial Diagnosis   Screening detected left breast mass 1.9 cm UOQ, ultrasound revealed 1.4 x 0.7 x 1.2 cm mass 2 o'clock position 8 cm from the nipple, 2 axillary lymph nodes with thickened cortices measuring 6 mm, Biopsy of the left breast mass IDC with mucinous features grade 1-2 with DCIS intermediate grade, lymph node benign, ER 90%, PR 0%, Ki-67 10%, HER-2 equivocal by IHC FISH negative ratio 1.13 copy #1.7, T1c N0 stage Ia   04/30/2018 Cancer Staging   Staging form: Breast, AJCC 8th Edition - Clinical stage from 04/30/2018: Stage IA (cT1c, cN0, cM0, G2, ER+, PR-, HER2-) - Signed by Odean Potts, MD on 04/30/2018   06/06/2018 Surgery   Left lumpectomy: Mucinous adenocarcinoma grade 1, 1.8 cm, DCIS, intermediate grade, negative for LV I or PNI, margins negative, DCIS focal less than 1 mm anterior margin, 0/4 lymph nodes negative, ER 90%, PR 0%, HER-2 negative, Ki-67 10%, T1CN0 stage Ia   06/14/2018 Cancer Staging   Staging form: Breast, AJCC 8th Edition - Pathologic stage from 06/14/2018: Stage IA (pT1c, pN0(sn), cM0, G1, ER+, PR-, HER2-) - Signed by Odean Potts, MD on 06/14/2018   07/23/2018 - 08/16/2018 Radiation Therapy   Adjuvant radiation at [facility] with Dr. Kennyth: 42.56 Gy in  16 treatments   09/2018 -  Anti-estrogen oral therapy   Anastrozole  daily     CHIEF COMPLIANT: Surveillance of breast cancer  HISTORY OF PRESENT ILLNESS:  History of Present Illness Melissa Rosales is a 79 year old female with estrogen receptor positive, stage IA invasive ductal carcinoma of the left breast, status post lumpectomy, adjuvant radiation, and four years of anastrozole , presenting for final oncology follow-up after completion of therapy and ongoing remission.  She completed lumpectomy, adjuvant radiation (42.56 Gy in 16 fractions), and a 5-year course of anastrozole , which she stopped by May of last year. She is not on current anti-estrogen therapy and cannot recall the exact stop date.  She has no breast pain or discomfort. Surveillance imaging including a mammogram last year and a recent spine MRI showed no evidence of malignancy. Surgical pathology from her most recent procedure showed no residual disease.  She asked about previously noted fatty tissue in the breast, which was clarified as a normal age-related finding. She arranged her own transportation and was accompanied by a support person. She has some visual difficulty but no reported barriers to attending oncology follow-up.     ALLERGIES:  is allergic to keflex [cephalexin].  MEDICATIONS:  Current Outpatient Medications  Medication Sig Dispense Refill   amLODipine  (NORVASC ) 10 MG tablet Take 10 mg by mouth daily.      aspirin EC 81 MG tablet Take 81 mg by mouth daily.     Calcium Carb-Cholecalciferol (CALCIUM + D3 PO) Take 1 tablet by mouth daily.      clotrimazole-betamethasone  (LOTRISONE) cream Apply 1 application topically 2 (two) times  daily as needed (for irritation).      Cyanocobalamin (VITAMIN B-12 PO) Take 1 tablet by mouth daily.     DULoxetine  (CYMBALTA ) 60 MG capsule Take 1 capsule (60 mg total) by mouth 2 (two) times daily. 60 capsule 5   enalapril  (VASOTEC ) 20 MG tablet Take 20 mg by mouth daily.       famotidine  (PEPCID ) 20 MG tablet Take 20 mg by mouth 2 (two) times daily.     FIBER FORMULA CAPS Take 1 capsule by mouth daily.     gabapentin  (NEURONTIN ) 600 MG tablet Take 1 tablet (600 mg total) by mouth 4 (four) times daily. Take 1 tablet by mouth 4 times daily 120 tablet 5   hydrochlorothiazide  (HYDRODIURIL ) 25 MG tablet Take 25 mg by mouth daily.     lactulose (CHRONULAC) 10 GM/15ML solution Take 10-20 g by mouth at bedtime as needed for moderate constipation.      meloxicam (MOBIC) 15 MG tablet Take 15 mg by mouth every morning.     methocarbamol  (ROBAXIN ) 500 MG tablet Take 1 tablet (500 mg total) by mouth 2 (two) times daily. 20 tablet 0   mirabegron  ER (MYRBETRIQ ) 25 MG TB24 tablet Take 1 tablet (25 mg total) by mouth in the morning and at bedtime. 60 tablet 11   Multiple Vitamins-Minerals (MULTIVITAMIN WITH MINERALS) tablet Take 1 tablet by mouth daily.     Omega 3 1000 MG CAPS Take 1,000 mg by mouth daily.      potassium chloride  (K-DUR) 10 MEQ tablet Take 10 mEq by mouth daily.     ranitidine (ZANTAC) 150 MG tablet Take 150 mg by mouth daily.     vitamin C  (ASCORBIC ACID ) 500 MG tablet Take 500 mg by mouth daily.     No current facility-administered medications for this visit.    PHYSICAL EXAMINATION: ECOG PERFORMANCE STATUS: 1 - Symptomatic but completely ambulatory  Vitals:   06/04/24 1509  BP: 121/64  Pulse: 68  Resp: 18  Temp: (!) 97.3 F (36.3 C)  SpO2: 96%   Filed Weights   06/04/24 1509  Weight: 200 lb 9.6 oz (91 kg)    Physical Exam   (exam performed in the presence of a chaperone)  LABORATORY DATA:  I have reviewed the data as listed    Latest Ref Rng & Units 10/19/2022    5:32 PM 03/25/2021    4:08 PM 05/31/2018    3:25 PM  CMP  Glucose 70 - 99 mg/dL 97  84  89   BUN 8 - 23 mg/dL 26  18  14    Creatinine 0.44 - 1.00 mg/dL 8.85  9.04  8.96   Sodium 135 - 145 mmol/L 136  137  136   Potassium 3.5 - 5.1 mmol/L 3.9  4.1  3.7   Chloride 98 - 111  mmol/L 102  101  101   CO2 22 - 32 mmol/L 25  28  27    Calcium 8.9 - 10.3 mg/dL 9.0  9.9  9.7   Total Protein 6.5 - 8.1 g/dL 7.7  8.0  8.0   Total Bilirubin 0.3 - 1.2 mg/dL 0.6  0.7  0.9   Alkaline Phos 38 - 126 U/L 46  53  46   AST 15 - 41 U/L 26  24  23    ALT 0 - 44 U/L 25  22  19      Lab Results  Component Value Date   WBC 4.9 10/19/2022   HGB 11.7 (L)  10/19/2022   HCT 36.7 10/19/2022   MCV 91.8 10/19/2022   PLT 213 10/19/2022   NEUTROABS 4.0 03/25/2021    ASSESSMENT & PLAN:  Malignant neoplasm of upper-outer quadrant of left breast in female, estrogen receptor positive (HCC) 06/06/2018:Left lumpectomy: Mucinous adenocarcinoma grade 1, 1.8 cm, DCIS, intermediate grade, negative for LV I or PNI, margins negative, DCIS focal less than 1 mm anterior margin, 0/4 lymph nodes negative, ER 90%, PR 0%, HER-2 negative, Ki-67 10%, T1CN0 stage Ia   Adjuvant radiation therapy at [facility]   Current treatment: Adjuvant antiestrogen therapy with anastrozole  1 mg daily x5 years started May 2020   Anastrozole  toxicities: Tolerating anastrozole  extremely well. Occasional hot flashes and occasional muscle aches and pains.   Breast cancer surveillance: mammogram 10/31/2023: Benign breast density category C Breast exam 06/04/2024: Benign   Legally blind. Return to clinic on an as-needed basis     No orders of the defined types were placed in this encounter.  The patient has a good understanding of the overall plan. she agrees with it. she will call with any problems that may develop before the next visit here.  I personally spent a total of 30 minutes in the care of the patient today including preparing to see the patient, getting/reviewing separately obtained history, performing a medically appropriate exam/evaluation, counseling and educating, placing orders, referring and communicating with other health care professionals, documenting clinical information in the EHR, independently interpreting  results, communicating results, and coordinating care.   Viinay K Kayson Bullis, MD 06/04/2024    "

## 2024-06-11 ENCOUNTER — Other Ambulatory Visit: Payer: Self-pay | Admitting: Interventional Radiology

## 2024-06-11 DIAGNOSIS — S22000A Wedge compression fracture of unspecified thoracic vertebra, initial encounter for closed fracture: Secondary | ICD-10-CM

## 2024-06-13 ENCOUNTER — Other Ambulatory Visit: Payer: Self-pay | Admitting: Hematology and Oncology

## 2024-06-13 ENCOUNTER — Telehealth: Payer: Self-pay

## 2024-06-13 NOTE — Telephone Encounter (Signed)
 Attempted to call pt to inquire about when she will be stopping anastrozole  d/t refill request. LVM for call back

## 2024-06-14 ENCOUNTER — Other Ambulatory Visit: Payer: Self-pay

## 2024-06-14 ENCOUNTER — Encounter (HOSPITAL_COMMUNITY): Payer: Self-pay | Admitting: Emergency Medicine

## 2024-06-14 ENCOUNTER — Emergency Department (HOSPITAL_COMMUNITY)

## 2024-06-14 ENCOUNTER — Emergency Department (HOSPITAL_COMMUNITY)
Admission: EM | Admit: 2024-06-14 | Discharge: 2024-06-14 | Disposition: A | Discharge status: Home / Self Care | Attending: Emergency Medicine | Admitting: Emergency Medicine

## 2024-06-14 DIAGNOSIS — D649 Anemia, unspecified: Secondary | ICD-10-CM | POA: Diagnosis not present

## 2024-06-14 DIAGNOSIS — R1013 Epigastric pain: Secondary | ICD-10-CM | POA: Insufficient documentation

## 2024-06-14 DIAGNOSIS — R197 Diarrhea, unspecified: Secondary | ICD-10-CM | POA: Diagnosis not present

## 2024-06-14 DIAGNOSIS — Z7982 Long term (current) use of aspirin: Secondary | ICD-10-CM | POA: Diagnosis not present

## 2024-06-14 LAB — COMPREHENSIVE METABOLIC PANEL WITH GFR
ALT: 15 U/L (ref 0–44)
AST: 21 U/L (ref 15–41)
Albumin: 4.5 g/dL (ref 3.5–5.0)
Alkaline Phosphatase: 55 U/L (ref 38–126)
Anion gap: 12 (ref 5–15)
BUN: 23 mg/dL (ref 8–23)
CO2: 23 mmol/L (ref 22–32)
Calcium: 9.6 mg/dL (ref 8.9–10.3)
Chloride: 105 mmol/L (ref 98–111)
Creatinine, Ser: 1.17 mg/dL — ABNORMAL HIGH (ref 0.44–1.00)
GFR, Estimated: 48 mL/min — ABNORMAL LOW
Glucose, Bld: 87 mg/dL (ref 70–99)
Potassium: 4.7 mmol/L (ref 3.5–5.1)
Sodium: 140 mmol/L (ref 135–145)
Total Bilirubin: 0.4 mg/dL (ref 0.0–1.2)
Total Protein: 7.9 g/dL (ref 6.5–8.1)

## 2024-06-14 LAB — URINALYSIS, ROUTINE W REFLEX MICROSCOPIC
Bilirubin Urine: NEGATIVE
Glucose, UA: NEGATIVE mg/dL
Hgb urine dipstick: NEGATIVE
Ketones, ur: NEGATIVE mg/dL
Leukocytes,Ua: NEGATIVE
Nitrite: NEGATIVE
Protein, ur: NEGATIVE mg/dL
Specific Gravity, Urine: 1.014 (ref 1.005–1.030)
pH: 7 (ref 5.0–8.0)

## 2024-06-14 LAB — CBC
HCT: 35.5 % — ABNORMAL LOW (ref 36.0–46.0)
Hemoglobin: 11.3 g/dL — ABNORMAL LOW (ref 12.0–15.0)
MCH: 29 pg (ref 26.0–34.0)
MCHC: 31.8 g/dL (ref 30.0–36.0)
MCV: 91 fL (ref 80.0–100.0)
Platelets: 225 K/uL (ref 150–400)
RBC: 3.9 MIL/uL (ref 3.87–5.11)
RDW: 15.3 % (ref 11.5–15.5)
WBC: 5.7 K/uL (ref 4.0–10.5)
nRBC: 0 % (ref 0.0–0.2)

## 2024-06-14 LAB — LIPASE, BLOOD: Lipase: 56 U/L — ABNORMAL HIGH (ref 11–51)

## 2024-06-14 MED ORDER — IOHEXOL 300 MG/ML  SOLN
80.0000 mL | Freq: Once | INTRAMUSCULAR | Status: AC | PRN
  Administered 2024-06-14: 80 mL via INTRAVENOUS

## 2024-06-14 MED ORDER — SODIUM CHLORIDE 0.9 % IV BOLUS
500.0000 mL | Freq: Once | INTRAVENOUS | Status: AC
  Administered 2024-06-14: 500 mL via INTRAVENOUS

## 2024-06-14 MED ORDER — PANTOPRAZOLE SODIUM 20 MG PO TBEC: 20.0000 mg | DELAYED_RELEASE_TABLET | Freq: Every day | ORAL | 0 refills | Status: AC

## 2024-06-14 NOTE — ED Notes (Signed)
 Patient is unable to provide urine specimen or a stool sample at this time. She is aware she needs to try within the next hour.

## 2024-06-14 NOTE — ED Provider Notes (Signed)
 " [place] EMERGENCY DEPARTMENT AT [facility] Provider Note   CSN: [id number] Arrival date & time: 06/14/24  9096     Patient presents with: Diarrhea   Melissa Rosales is a 79 y.o. female.   Patient is a 79 year old female who presents emergency department chief complaint of epigastric abdominal pain, diarrhea which has been ongoing for proxy the past 2 weeks.  She notes that she has had no melena or hematochezia.  She denies any nausea or vomiting.  There has been no associated fever or chills.  She has had no associated chest pain or shortness of breath.  She denies any recent antibiotic use and has been around no one has been sick with similar symptoms.  There is been no associated dizziness, lightheadedness or syncope.  She denies any dysuria or hematuria.   Diarrhea Associated symptoms: abdominal pain        Prior to Admission medications  Medication Sig Start Date End Date Taking? Authorizing Provider  amLODipine  (NORVASC ) 10 MG tablet Take 10 mg by mouth daily.     [provider]  aspirin EC 81 MG tablet Take 81 mg by mouth daily.    [provider]  Calcium Carb-Cholecalciferol (CALCIUM + D3 PO) Take 1 tablet by mouth daily.     [provider]  clotrimazole-betamethasone  (LOTRISONE) cream Apply 1 application topically 2 (two) times daily as needed (for irritation).     [provider]  Cyanocobalamin (VITAMIN B-12 PO) Take 1 tablet by mouth daily.    [provider]  DULoxetine  (CYMBALTA ) 60 MG capsule Take 1 capsule (60 mg total) by mouth 2 (two) times daily. 05/18/21   Gayland Lauraine PARAS, NP  enalapril  (VASOTEC ) 20 MG tablet Take 20 mg by mouth daily.     [provider]  famotidine  (PEPCID ) 20 MG tablet Take 20 mg by mouth 2 (two) times daily. 03/01/21   [provider]  FIBER FORMULA CAPS Take 1 capsule by mouth daily.    [provider]  gabapentin  (NEURONTIN ) 600 MG tablet Take 1 tablet  (600 mg total) by mouth 4 (four) times daily. Take 1 tablet by mouth 4 times daily 05/18/21   Gayland Lauraine PARAS, NP  hydrochlorothiazide  (HYDRODIURIL ) 25 MG tablet Take 25 mg by mouth daily.    [provider]  lactulose (CHRONULAC) 10 GM/15ML solution Take 10-20 g by mouth at bedtime as needed for moderate constipation.     [provider]  meloxicam (MOBIC) 15 MG tablet Take 15 mg by mouth every morning. 06/29/21   [provider]  methocarbamol  (ROBAXIN ) 500 MG tablet Take 1 tablet (500 mg total) by mouth 2 (two) times daily. 01/21/24   Suellen Cantor A, PA-C  mirabegron  ER (MYRBETRIQ ) 25 MG TB24 tablet Take 1 tablet (25 mg total) by mouth in the morning and at bedtime. 06/14/23   Larocco, Sarah C, FNP  Multiple Vitamins-Minerals (MULTIVITAMIN WITH MINERALS) tablet Take 1 tablet by mouth daily.    [provider]  Omega 3 1000 MG CAPS Take 1,000 mg by mouth daily.     [provider]  potassium chloride  (K-DUR) 10 MEQ tablet Take 10 mEq by mouth daily. 05/05/17   [provider]  ranitidine (ZANTAC) 150 MG tablet Take 150 mg by mouth daily.    [provider]  vitamin C  (ASCORBIC ACID ) 500 MG tablet Take 500 mg by mouth daily.    [provider]    Allergies: Keflex [cephalexin]  Review of Systems  Gastrointestinal:  Positive for abdominal pain and diarrhea.  All other systems reviewed and are negative.   Updated Vital Signs BP 121/66 (BP Location: Left Arm)   Pulse 71   Temp 98.4 F (36.9 C) (Oral)   Resp 16   Ht 5' 7 (1.702 m)   Wt 92 kg   SpO2 95%   BMI 31.77 kg/m   Physical Exam Vitals and nursing note reviewed.  Constitutional:      General: She is not in acute distress.    Appearance: Normal appearance. She is not ill-appearing.  HENT:     Head: Normocephalic and atraumatic.     Nose: Nose normal.     Mouth/Throat:     Mouth: Mucous membranes are moist.  Eyes:     Extraocular Movements:  Extraocular movements intact.     Conjunctiva/sclera: Conjunctivae normal.     Pupils: Pupils are equal, round, and reactive to light.  Cardiovascular:     Rate and Rhythm: Normal rate and regular rhythm.     Pulses: Normal pulses.     Heart sounds: Normal heart sounds. No murmur heard.    No gallop.  Pulmonary:     Effort: Pulmonary effort is normal. No respiratory distress.     Breath sounds: Normal breath sounds. No stridor. No wheezing, rhonchi or rales.  Abdominal:     General: Abdomen is flat. Bowel sounds are normal. There is no distension.     Palpations: Abdomen is soft.     Tenderness: There is no abdominal tenderness. There is no guarding.  Musculoskeletal:        General: Normal range of motion.     Cervical back: Normal range of motion and neck supple. No rigidity or tenderness.  Skin:    General: Skin is warm and dry.  Neurological:     General: No focal deficit present.     Mental Status: She is alert and oriented to person, place, and time. Mental status is at baseline.  Psychiatric:        Mood and Affect: Mood normal.        Behavior: Behavior normal.        Thought Content: Thought content normal.        Judgment: Judgment normal.     (all labs ordered are listed, but only abnormal results are displayed) Labs Reviewed  CBC - Abnormal; Notable for the following components:      Result Value   Hemoglobin 11.3 (*)    HCT 35.5 (*)    All other components within normal limits  GASTROINTESTINAL PANEL BY PCR, STOOL (REPLACES STOOL CULTURE)  C DIFFICILE QUICK SCREEN W PCR REFLEX    LIPASE, BLOOD  COMPREHENSIVE METABOLIC PANEL WITH GFR  URINALYSIS, ROUTINE W REFLEX MICROSCOPIC    EKG: None  Radiology: No results found.   Procedures   Medications Ordered in the ED  sodium chloride  0.9 % bolus 500 mL (500 mLs Intravenous New Bag/Given 06/14/24 0947)                                    Medical Decision Making Amount and/or Complexity of Data  Reviewed Labs: ordered. Radiology: ordered.  Risk Prescription drug management.   This patient presents to the ED for concern of abdominal pain, diarrhea differential diagnosis includes acute appendicitis, cholecystitis, obstruction, diverticulitis, ovarian torsion or cyst, PID, tubo-ovarian abscess, pyelonephritis, kidney stone,  pancreatitis, mesenteric ischemia, gastritis, GERD, colitis, gastroenteritis    Additional history obtained:  Additional history obtained from medical records External records from outside source obtained and reviewed including medical records   Lab Tests:  I Ordered, and personally interpreted labs.  The pertinent results include: No leukocytosis, anemia at baseline, creatinine at baseline, normal liver function, unremarkable electrolytes, unremarkable urinalysis, mild elevation of lipase   Imaging Studies ordered:  I ordered imaging studies including CT scan of abdomen and pelvis I independently visualized and interpreted imaging which showed no acute surgical process I agree with the radiologist interpretation   Medicines ordered and prescription drug management:  I ordered medication including IV fluids for diarrhea Reevaluation of the patient after these medicines showed that the patient improved I have reviewed the patients home medicines and have made adjustments as needed   Problem List / ED Course:  Patient is doing well at this time and is stable for discharge home.  Patient has had no further bouts of diarrhea in the emergency department.  Discussed with patient that we will place her on a PPI at this point given her epigastric discomfort and suspect a component of gastritis versus esophagitis.  She had diverticulosis with no indication for diverticulitis.  No findings of colitis were noted on CT scan.  She has had no further diarrhea in the emergency department and was unable to provide a sample today.  Vital signs have remained stable with  no indication for sepsis.  Do not suspect any further workup is warranted or admission at this time on an emergent basis.  Close follow-up with PCP was discussed as well as strict turn precautions for any new or worsening symptoms.  Patient voiced understanding and had no additional questions. Patient was fully evaluated by attending physician who is in agreement to plan at this time.   Social Determinants of Health:  None        Final diagnoses:  None    ED Discharge Orders     None          Daralene Lonni JONETTA DEVONNA 06/14/24 1532    Suzette Pac, MD 06/15/24 1844  "

## 2024-06-14 NOTE — ED Triage Notes (Signed)
 Pt in w/ complaints of diarrhea x2 weeks and abd pain x1 week. Denies nausea or emesis.

## 2024-06-14 NOTE — Discharge Instructions (Signed)
 Please follow-up closely with your primary care doctor on an outpatient basis.  Return to emergency department immediately for any new or worsening symptoms.

## 2024-12-23 ENCOUNTER — Ambulatory Visit: Admitting: Urology
